# Patient Record
Sex: Female | Born: 1950 | Race: White | Hispanic: No | Marital: Married | Smoking: Never smoker
Health system: Southern US, Community
[De-identification: ages and names within clinical notes are randomized; demographics above are authoritative.]

## PROBLEM LIST (undated history)

## (undated) DIAGNOSIS — R7303 Prediabetes: Secondary | ICD-10-CM

## (undated) DIAGNOSIS — H269 Unspecified cataract: Secondary | ICD-10-CM

## (undated) DIAGNOSIS — F329 Major depressive disorder, single episode, unspecified: Secondary | ICD-10-CM

## (undated) DIAGNOSIS — C4491 Basal cell carcinoma of skin, unspecified: Secondary | ICD-10-CM

## (undated) DIAGNOSIS — N890 Mild vaginal dysplasia: Secondary | ICD-10-CM

## (undated) DIAGNOSIS — Z9889 Other specified postprocedural states: Secondary | ICD-10-CM

## (undated) DIAGNOSIS — T7840XA Allergy, unspecified, initial encounter: Secondary | ICD-10-CM

## (undated) DIAGNOSIS — E785 Hyperlipidemia, unspecified: Secondary | ICD-10-CM

## (undated) DIAGNOSIS — K759 Inflammatory liver disease, unspecified: Secondary | ICD-10-CM

## (undated) DIAGNOSIS — N200 Calculus of kidney: Secondary | ICD-10-CM

## (undated) DIAGNOSIS — Z87442 Personal history of urinary calculi: Secondary | ICD-10-CM

## (undated) DIAGNOSIS — I81 Portal vein thrombosis: Secondary | ICD-10-CM

## (undated) DIAGNOSIS — R112 Nausea with vomiting, unspecified: Secondary | ICD-10-CM

## (undated) DIAGNOSIS — I10 Essential (primary) hypertension: Secondary | ICD-10-CM

## (undated) DIAGNOSIS — A630 Anogenital (venereal) warts: Secondary | ICD-10-CM

## (undated) DIAGNOSIS — B029 Zoster without complications: Secondary | ICD-10-CM

## (undated) DIAGNOSIS — N189 Chronic kidney disease, unspecified: Secondary | ICD-10-CM

## (undated) DIAGNOSIS — E119 Type 2 diabetes mellitus without complications: Secondary | ICD-10-CM

## (undated) DIAGNOSIS — D049 Carcinoma in situ of skin, unspecified: Secondary | ICD-10-CM

## (undated) DIAGNOSIS — F32A Depression, unspecified: Secondary | ICD-10-CM

## (undated) DIAGNOSIS — M1990 Unspecified osteoarthritis, unspecified site: Secondary | ICD-10-CM

## (undated) DIAGNOSIS — T8859XA Other complications of anesthesia, initial encounter: Secondary | ICD-10-CM

## (undated) DIAGNOSIS — F419 Anxiety disorder, unspecified: Secondary | ICD-10-CM

## (undated) DIAGNOSIS — B259 Cytomegaloviral disease, unspecified: Secondary | ICD-10-CM

## (undated) HISTORY — DX: Basal cell carcinoma of skin, unspecified: C44.91

## (undated) HISTORY — DX: Depression, unspecified: F32.A

## (undated) HISTORY — DX: Anxiety disorder, unspecified: F41.9

## (undated) HISTORY — DX: Mild vaginal dysplasia: N89.0

## (undated) HISTORY — DX: Unspecified cataract: H26.9

## (undated) HISTORY — DX: Major depressive disorder, single episode, unspecified: F32.9

## (undated) HISTORY — DX: Essential (primary) hypertension: I10

## (undated) HISTORY — DX: Allergy, unspecified, initial encounter: T78.40XA

## (undated) HISTORY — DX: Unspecified osteoarthritis, unspecified site: M19.90

## (undated) HISTORY — DX: Calculus of kidney: N20.0

## (undated) HISTORY — DX: Chronic kidney disease, unspecified: N18.9

## (undated) HISTORY — DX: Zoster without complications: B02.9

## (undated) HISTORY — PX: HERNIA REPAIR: SHX51

## (undated) HISTORY — DX: Carcinoma in situ of skin, unspecified: D04.9

## (undated) HISTORY — DX: Type 2 diabetes mellitus without complications: E11.9

## (undated) HISTORY — DX: Portal vein thrombosis: I81

## (undated) HISTORY — PX: BLADDER SURGERY: SHX569

## (undated) HISTORY — DX: Hyperlipidemia, unspecified: E78.5

## (undated) HISTORY — DX: Anogenital (venereal) warts: A63.0

## (undated) HISTORY — PX: TUBAL LIGATION: SHX77

## (undated) HISTORY — PX: SKIN CANCER EXCISION: SHX779

---

## 1984-07-29 HISTORY — PX: WISDOM TOOTH EXTRACTION: SHX21

## 1984-07-29 HISTORY — PX: ABDOMINAL HYSTERECTOMY: SHX81

## 2004-05-10 ENCOUNTER — Ambulatory Visit: Payer: Self-pay | Admitting: Family Medicine

## 2007-01-16 ENCOUNTER — Ambulatory Visit: Payer: Self-pay | Admitting: Podiatry

## 2009-01-16 DIAGNOSIS — M719 Bursopathy, unspecified: Secondary | ICD-10-CM | POA: Insufficient documentation

## 2009-09-20 DIAGNOSIS — E1159 Type 2 diabetes mellitus with other circulatory complications: Secondary | ICD-10-CM | POA: Insufficient documentation

## 2009-09-20 DIAGNOSIS — F339 Major depressive disorder, recurrent, unspecified: Secondary | ICD-10-CM | POA: Insufficient documentation

## 2009-09-29 DIAGNOSIS — E559 Vitamin D deficiency, unspecified: Secondary | ICD-10-CM | POA: Insufficient documentation

## 2012-08-20 ENCOUNTER — Ambulatory Visit: Payer: Self-pay | Admitting: Podiatry

## 2012-08-20 HISTORY — PX: FOOT SURGERY: SHX648

## 2012-08-28 ENCOUNTER — Ambulatory Visit: Payer: Self-pay | Admitting: Family Medicine

## 2012-10-12 DIAGNOSIS — N23 Unspecified renal colic: Secondary | ICD-10-CM | POA: Insufficient documentation

## 2012-10-12 DIAGNOSIS — N201 Calculus of ureter: Secondary | ICD-10-CM | POA: Insufficient documentation

## 2012-10-12 DIAGNOSIS — N3941 Urge incontinence: Secondary | ICD-10-CM | POA: Insufficient documentation

## 2013-07-29 DIAGNOSIS — I81 Portal vein thrombosis: Secondary | ICD-10-CM

## 2013-07-29 HISTORY — DX: Portal vein thrombosis: I81

## 2014-10-28 ENCOUNTER — Ambulatory Visit: Admit: 2014-10-28 | Disposition: A | Discharge status: Home / Self Care | Payer: Self-pay | Admitting: Family Medicine

## 2014-10-28 LAB — CBC AND DIFFERENTIAL
HCT: 39 % (ref 36–46)
Hemoglobin: 12.9 g/dL (ref 12.0–16.0)
Platelets: 350 10*3/uL (ref 150–399)
WBC: 10.3 10^3/mL

## 2014-10-28 LAB — TSH: TSH: 2.83 u[IU]/mL (ref <=5.90)

## 2014-10-29 ENCOUNTER — Inpatient Hospital Stay
Admit: 2014-10-29 | Disposition: A | Discharge status: Home / Self Care | Payer: Self-pay | Attending: Internal Medicine | Admitting: Internal Medicine

## 2014-10-29 ENCOUNTER — Ambulatory Visit
Admit: 2014-10-29 | Disposition: A | Discharge status: Home / Self Care | Payer: Self-pay | Attending: Family Medicine | Admitting: Family Medicine

## 2014-10-29 LAB — MAGNESIUM: Magnesium: 2 mg/dL

## 2014-10-29 LAB — CBC WITH DIFFERENTIAL/PLATELET
BASOS PCT: 1 %
Basophil #: 0.1 10*3/uL (ref 0.0–0.1)
EOS PCT: 1.8 %
Eosinophil #: 0.2 10*3/uL (ref 0.0–0.7)
HCT: 42.4 % (ref 35.0–47.0)
HGB: 13.8 g/dL (ref 12.0–16.0)
Lymphocyte #: 2.3 10*3/uL (ref 1.0–3.6)
Lymphocyte %: 24 %
MCH: 28 pg (ref 26.0–34.0)
MCHC: 32.6 g/dL (ref 32.0–36.0)
MCV: 86 fL (ref 80–100)
Monocyte #: 0.7 x10 3/mm (ref 0.2–0.9)
Monocyte %: 7.5 %
NEUTROS ABS: 6.2 10*3/uL (ref 1.4–6.5)
NEUTROS PCT: 65.7 %
Platelet: 349 10*3/uL (ref 150–440)
RBC: 4.93 10*6/uL (ref 3.80–5.20)
RDW: 14.6 % — ABNORMAL HIGH (ref 11.5–14.5)
WBC: 9.5 10*3/uL (ref 3.6–11.0)

## 2014-10-29 LAB — COMPREHENSIVE METABOLIC PANEL
Albumin: 4.3 g/dL
Alkaline Phosphatase: 97 U/L
Anion Gap: 11 (ref 7–16)
BILIRUBIN TOTAL: 0.9 mg/dL
BUN: 12 mg/dL
Calcium, Total: 9.4 mg/dL
Chloride: 101 mmol/L
Co2: 25 mmol/L
Creatinine: 1.18 mg/dL — ABNORMAL HIGH
EGFR (African American): 57 — ABNORMAL LOW
EGFR (Non-African Amer.): 49 — ABNORMAL LOW
Glucose: 95 mg/dL
POTASSIUM: 3.8 mmol/L
SGOT(AST): 58 U/L — ABNORMAL HIGH
SGPT (ALT): 75 U/L — ABNORMAL HIGH
SODIUM: 137 mmol/L
TOTAL PROTEIN: 9.1 g/dL — AB

## 2014-10-29 LAB — LACTIC ACID, PLASMA: LACTIC ACID, VENOUS: 1.3 mmol/L

## 2014-10-29 LAB — TROPONIN I

## 2014-10-29 LAB — LIPASE, BLOOD: Lipase: 67 U/L — ABNORMAL HIGH

## 2014-10-29 LAB — PROTIME-INR
INR: 1
Prothrombin Time: 13.1 secs

## 2014-10-29 LAB — APTT: ACTIVATED PTT: 27.4 s (ref 23.6–35.9)

## 2014-10-30 LAB — BASIC METABOLIC PANEL
ANION GAP: 6 — AB (ref 7–16)
BUN: 10 mg/dL
Calcium, Total: 8.4 mg/dL — ABNORMAL LOW
Chloride: 109 mmol/L
Co2: 23 mmol/L
Creatinine: 1.16 mg/dL — ABNORMAL HIGH
GFR CALC AF AMER: 58 — AB
GFR CALC NON AF AMER: 50 — AB
Glucose: 104 mg/dL — ABNORMAL HIGH
Potassium: 3.9 mmol/L
SODIUM: 138 mmol/L

## 2014-10-30 LAB — CBC WITH DIFFERENTIAL/PLATELET
BASOS ABS: 0.1 10*3/uL (ref 0.0–0.1)
Basophil %: 0.8 %
EOS ABS: 0.2 10*3/uL (ref 0.0–0.7)
EOS PCT: 2.9 %
HCT: 35.1 % (ref 35.0–47.0)
HGB: 11.8 g/dL — ABNORMAL LOW (ref 12.0–16.0)
Lymphocyte #: 2.5 10*3/uL (ref 1.0–3.6)
Lymphocyte %: 31.9 %
MCH: 28.6 pg (ref 26.0–34.0)
MCHC: 33.5 g/dL (ref 32.0–36.0)
MCV: 85 fL (ref 80–100)
MONO ABS: 0.7 x10 3/mm (ref 0.2–0.9)
Monocyte %: 9 %
Neutrophil #: 4.4 10*3/uL (ref 1.4–6.5)
Neutrophil %: 55.4 %
PLATELETS: 291 10*3/uL (ref 150–440)
RBC: 4.11 10*6/uL (ref 3.80–5.20)
RDW: 14.2 % (ref 11.5–14.5)
WBC: 8 10*3/uL (ref 3.6–11.0)

## 2014-10-30 LAB — HEPARIN LEVEL (UNFRACTIONATED)
Anti-Xa(Unfractionated): 0.46 IU/mL (ref 0.30–0.70)
Anti-Xa(Unfractionated): 0.48 IU/mL (ref 0.30–0.70)

## 2014-10-31 LAB — CBC WITH DIFFERENTIAL/PLATELET
BASOS ABS: 0.1 10*3/uL (ref 0.0–0.1)
BASOS PCT: 0.7 %
Eosinophil #: 0.2 10*3/uL (ref 0.0–0.7)
Eosinophil %: 3.4 %
HCT: 37 % (ref 35.0–47.0)
HGB: 12 g/dL (ref 12.0–16.0)
LYMPHS PCT: 32.2 %
Lymphocyte #: 2.3 10*3/uL (ref 1.0–3.6)
MCH: 27.6 pg (ref 26.0–34.0)
MCHC: 32.3 g/dL (ref 32.0–36.0)
MCV: 86 fL (ref 80–100)
MONOS PCT: 8 %
Monocyte #: 0.6 x10 3/mm (ref 0.2–0.9)
Neutrophil #: 4 10*3/uL (ref 1.4–6.5)
Neutrophil %: 55.7 %
Platelet: 346 10*3/uL (ref 150–440)
RBC: 4.33 10*6/uL (ref 3.80–5.20)
RDW: 14.4 % (ref 11.5–14.5)
WBC: 7.1 10*3/uL (ref 3.6–11.0)

## 2014-10-31 LAB — HEPARIN LEVEL (UNFRACTIONATED): ANTI-XA(UNFRACTIONATED): 0.43 [IU]/mL (ref 0.30–0.70)

## 2014-10-31 LAB — CLOSTRIDIUM DIFFICILE(ARMC)

## 2014-11-03 LAB — CULTURE, BLOOD (SINGLE)

## 2014-11-03 LAB — STOOL CULTURE

## 2014-11-14 ENCOUNTER — Ambulatory Visit
Admit: 2014-11-14 | Disposition: A | Discharge status: Home / Self Care | Payer: Self-pay | Attending: Oncology | Admitting: Oncology

## 2014-11-27 NOTE — Consult Note (Signed)
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  Doubt bowel ischemia presentis anticoagulationfollow  Electronic Signatures: Algernon Huxley (MD)  (Signed on 03-Apr-16 10:23)  Authored  Last Updated: 03-Apr-16 10:23 by Algernon Huxley (MD)

## 2014-11-27 NOTE — Discharge Summary (Signed)
PATIENT NAME:  [name] [name], [name] [name] MR#:  [id number] DATE OF BIRTH:  [birth year]  DATE OF ADMISSION:  10/29/2014 DATE OF DISCHARGE:  10/31/2014  DISCHARGE DIAGNOSIS: Acute portal vein thrombosis.   SECONDARY DIAGNOSES:   Diabetes mellitus type 2 and depression. She says she has a history of Lyme disease, CMV, a long time back.    CONSULTATIONS: Vascular surgery, Algernon Huxley, MD.  PROCEDURES AND RADIOLOGY: 1.  Chest x-ray on April 1 showed no acute cardiopulmonary disease.  2.  CT scan of the abdomen and pelvis with contrast on April 2 showed venous thrombosis involving the right portal vein, distal portal vein and superior mesenteric vein.   MAJOR LABORATORY PANEL: 1.  Blood cultures x2 were negative on April 2.  2.  Stool for C. difficile was negative.   HISTORY AND SHORT HOSPITAL COURSE: The patient is a 64 year old female with the above-mentioned medical problems who was admitted for abdominal pain and fever. She was diagnosed with acute portal vein thrombosis. No obvious infectious etiology was found for her fever. Could be viral in nature. Vascular surgery consultation was obtained with Dr. Leotis Pain who recommended treatment with anticoagulation. The patient was continued on IV heparin while in the hospital and subsequently was changed to newer oral anticoagulant. I discussed the case with Dr. Delight Hoh from hematology who recommended Eliquis for this patient. There is no clear indication at this point for Eliquis for treatment for portal vein thrombosis, although Dr. Grayland Ormond recommended this and outpatient followup with hematology at [facility] here at [facility]. The patient was not was agreement for the same. Also discussed the case with the patient's primary care physician, Dr. Margarita Rana who was in agreement with the same. The patient is being discharged home. She does not have any further symptoms. She does have some diarrhea, although stool for Clostridium difficile was  negative and her diarrhea seemed to be improving. This certainly could be from IV contrast. The patient is being discharged home, as she is very adamant wanting to go home and does not have much nausea, vomiting and remains afebrile on the date of discharge. Her vital signs are as follows: Temperature 98.4, heart rate 63 per minute, respirations 17 per minute, blood pressure 131/80. She is saturating 98% on room air.  PERTINENT PHYSICAL EXAMINATION ON THE DATE OF DISCHARGE:  CARDIOVASCULAR: S1, S2 normal. No murmurs, rubs, or gallop.  LUNGS: Clear to auscultation bilaterally. No wheezing, rales, rhonchi, or crepitation.  ABDOMEN: Soft, benign.  NEUROLOGIC: Nonfocal examination. All other physical examination remained at baseline.  DISCHARGE MEDICATIONS:   Medication Instructions  metformin 500 mg oral tablet  1 tab(s) orally 2 times a day   sertraline 100 mg oral tablet  1 tab(s) orally once a day   zantac 150 150 mg oral tablet  1 tab(s) orally once a day as needed for indigestion, heartburn.   eliquis 5 mg oral tablet  2 tab(s) orally 2 times a day for first 5 days followed by 1 tab bid      DISCHARGE DIET: Low sodium, low fat, low cholesterol.   DISCHARGE ACTIVITY: As tolerated.  DISCHARGE INSTRUCTIONS AND FOLLOW-UP: The patient was instructed to follow up with her primary care physician, Dr. Margarita Rana, in 2 to 4 weeks. She will need follow-up with Dr. Delight Hoh in 1 to 2 weeks based.  TOTAL TIME SPENT WITH THIS PATIENT: 45 minutes. ____________________________ Lucina Mellow. Manuella Ghazi, MD vss:ST D: 10/31/2014 10:43:09 ET T: 10/31/2014 11:04:17 ET JOB#:  [id number]  cc: Jordell Outten S. Manuella Ghazi, MD, <Dictator> Jerrell Belfast, MD Kathlene November. Grayland Ormond, MD Algernon Huxley, MD   Lucina Mellow [facility] MD ELECTRONICALLY SIGNED 11/02/2014 11:12

## 2014-11-27 NOTE — H&P (Signed)
PATIENT NAME:  [name], [name] MR#:  [id number] DATE OF BIRTH:  12/02/1950  DATE OF ADMISSION:  10/29/2014  REASON FOR ADMISSION: Abdominal pain and fever.   HISTORY OF PRESENT ILLNESS: A 64 year old female with depression, diabetes brought in because of abdominal CAT scan of the abdomen. The patient had an episode of vomiting, diarrhea and GI illness on March 18 for about 3 to 4 days. The patient recovered from this illness, however, she is continued to have fever since this. Fever is up to 100 to 101 Fahrenheit every day, mostly after she eats lunch. Denies any weight loss. No further nausea. No vomiting. The patient denies any recent rash. No travel history. No mouth ulcers. No headache. No sore throat and no joint pains. The patient went to Dr. Margarita [name] regarding her fevers. She ordered abdominal CAT scan and it showed portal vein thrombosis.   PAST MEDICAL HISTORY: Significant for diabetes mellitus type 2 and depression. She says she has a history of Lyme disease, CMV, a long time back.   PAST SURGICAL HISTORY: Total abdominal hysterectomy.  ALLERGIC WHEAT, FLAGYL AND STATINS.   Denies any celiac disease.  SOCIAL HISTORY: No smoking, no drinking, no drugs.   MEDICATION: Metformin 500 mg p.o. daily. The patient is supposed to take it twice daily, but she says he takes it only once a day, Zoloft 100 mg p.o. daily.   FAMILY HISTORY: Diabetes in the father and also son.   REVIEW OF SYSTEMS: CONSTITUTIONAL: Has fever for about 2 weeks. No weakness. No weight loss.  EYES: No blurred vision.  ENT: No tinnitus. No ear pain. No epistaxis. No difficulty swallowing.  RESPIRATORY: No cough. No wheezing.  CARDIOVASCULAR: No chest pain, no orthopnea, no PND.  GASTROINTESTINAL: Has some nausea occasionally and mild mid epigastric abdominal pain on and off.  GENITOURINARY: No dysuria or hematuria.  ENDOCRINE: No polyuria or polydipsia.  INTEGUMENTARY: No skin rashes.  MUSCULOSKELETAL: No  joint pains.  NEUROLOGIC: No numbness, weakness, CVA or TIA. PSYCHIATRIC: No anxiety and insomnia.   PHYSICAL EXAMINATION: VITAL SIGNS: Temperature 97.8, heart rate 64, blood pressure 119/74, saturations 97% on room.  GENERAL: She is a well developed, well nourished female, answering questions appropriately. Not in distress.  HEENT: Atraumatic, normocephalic. Pupils are equal, round and reactive to light and accommodation.  Extraocular movements are intact. No scleral icterus. No conjunctivitis. Hearing is intact. No pharyngeal erythema. Mucous membranes are dry.  NECK: No thyroid enlargement. No JVD. No carotid bruits.  LUNGS: Clear to auscultation. No wheeze. No rales.  CARDIOVASCULAR: S1, S2 regular. No tachycardia. PMI not displaced. No thrills. Good femoral pulses, pedal pulse. No extremity edema.  ABDOMEN: Slight mid epigastric tenderness present. No rebound tenderness. Bowel sounds present. No organomegaly. No hernias.  MUSCULOSKELETAL: Normal strength 5/5 in upper and lower extremities.  SKIN: No skin rashes. No erythema, no nodules.  NEUROLOGIC: Cranial nerves II through XII intact. Deep tendon reflexes 2+ bilaterally. Sensory intact. No dysarthria or aphasia.  PSYCHIATRIC: Oriented to time, place, person.   LABORATORY DATA: Lipase is 67, INR is 1, magnesium is 2, WBC 9.5, hemoglobin 13.8, hematocrit 42.4, platelets 349,000. Electrolytes: Sodium is 137, potassium 3.8, chloride 101, bicarbonate 25, BUN 12, creatinine 1.18, glucose 95. Troponin less than 0.03.   DIAGNOSTIC DATA: Abdominal CAT scan shows venous thrombosis of the right portal vein, distal main portal vein, superior mesenteric vein. No obvious etiology for this is identified in the abdomen or pelvis. May be associated with hypercoagulable state,  dehydration, pancreatitis, bowel pathology, although there is no bowel wall thickening or surrounding stranding to suggest enteritis. The patient's EKG shows sinus bradycardia at 58  beats. No ST-T changes.   ASSESSMENT AND PLAN: 1. The patient is a 64 year old female with portal vein thrombosis, likely secondary to severe dehydration 2 weeks ago and also mild acute pancreatitis. Admit to medical service. Start on a heparin drip, IV fluids and obtain hypercoagulable work-up including protein C, protein S, factor V Leiden deficiency and antithrombin III. Vascular consult with Dr. Lucky Cowboy is already in place to evaluate for portal vein thrombosis and possible angiogram and further intervention. The patient will be on IV fluids. Follow the blood cultures. The patient has some vascular stranding concerning for phlebitis. We are giving her IV fluids and IV antibiotics.  2. Diabetes mellitus, type 2. She is on metformin at home, but creatinine is slightly up with her dehydration, so hold metformin and do sliding scale with coverage.  3. Depression. Continue her Zoloft 100 mg p.o. daily.   TIME SPENT: 55 minutes.    ____________________________ Epifanio Lesches, MD sk:TT D: 10/29/2014 17:27:32 ET T: 10/29/2014 18:12:20 ET JOB#: [id number]  cc: Epifanio Lesches, MD, <Dictator> Epifanio Lesches MD ELECTRONICALLY SIGNED 10/30/2014 15:54

## 2014-12-15 LAB — HEMOGLOBIN A1C: Hgb A1c MFr Bld: 6.4 % — AB (ref 4.0–6.0)

## 2014-12-15 LAB — LIPID PANEL
CHOLESTEROL: 262 mg/dL — AB (ref 0–200)
HDL: 51 mg/dL (ref 35–70)
LDL Cholesterol: 176 mg/dL
Triglycerides: 174 mg/dL — AB (ref 40–160)

## 2014-12-15 LAB — HEPATIC FUNCTION PANEL
ALT: 43 U/L — AB (ref 7–35)
AST: 32 U/L (ref 13–35)

## 2014-12-15 LAB — BASIC METABOLIC PANEL
BUN: 15 mg/dL (ref 4–21)
CREATININE: 1.1 mg/dL (ref 0.5–1.1)
Glucose: 114 mg/dL
POTASSIUM: 4.6 mmol/L (ref 3.4–5.3)
Sodium: 141 mmol/L (ref 137–147)

## 2015-02-07 ENCOUNTER — Other Ambulatory Visit: Payer: Self-pay | Admitting: Family Medicine

## 2015-02-07 DIAGNOSIS — F329 Major depressive disorder, single episode, unspecified: Secondary | ICD-10-CM

## 2015-02-07 DIAGNOSIS — F32A Depression, unspecified: Secondary | ICD-10-CM

## 2015-03-02 ENCOUNTER — Other Ambulatory Visit: Payer: Self-pay | Admitting: Family Medicine

## 2015-03-02 NOTE — Telephone Encounter (Signed)
Next ov is on 04/05/2015

## 2015-04-04 DIAGNOSIS — F419 Anxiety disorder, unspecified: Secondary | ICD-10-CM | POA: Insufficient documentation

## 2015-04-04 DIAGNOSIS — B356 Tinea cruris: Secondary | ICD-10-CM | POA: Insufficient documentation

## 2015-04-04 DIAGNOSIS — L309 Dermatitis, unspecified: Secondary | ICD-10-CM | POA: Insufficient documentation

## 2015-04-04 DIAGNOSIS — E785 Hyperlipidemia, unspecified: Secondary | ICD-10-CM | POA: Insufficient documentation

## 2015-04-04 DIAGNOSIS — I81 Portal vein thrombosis: Secondary | ICD-10-CM | POA: Insufficient documentation

## 2015-04-04 DIAGNOSIS — E1122 Type 2 diabetes mellitus with diabetic chronic kidney disease: Secondary | ICD-10-CM | POA: Insufficient documentation

## 2015-04-04 DIAGNOSIS — E1169 Type 2 diabetes mellitus with other specified complication: Secondary | ICD-10-CM | POA: Insufficient documentation

## 2015-04-04 DIAGNOSIS — L98499 Non-pressure chronic ulcer of skin of other sites with unspecified severity: Secondary | ICD-10-CM | POA: Insufficient documentation

## 2015-04-04 DIAGNOSIS — A6920 Lyme disease, unspecified: Secondary | ICD-10-CM

## 2015-04-04 DIAGNOSIS — N200 Calculus of kidney: Secondary | ICD-10-CM | POA: Insufficient documentation

## 2015-04-04 DIAGNOSIS — R5383 Other fatigue: Secondary | ICD-10-CM | POA: Insufficient documentation

## 2015-04-04 DIAGNOSIS — S8990XA Unspecified injury of unspecified lower leg, initial encounter: Secondary | ICD-10-CM | POA: Insufficient documentation

## 2015-04-04 DIAGNOSIS — N289 Disorder of kidney and ureter, unspecified: Secondary | ICD-10-CM | POA: Insufficient documentation

## 2015-04-04 DIAGNOSIS — R109 Unspecified abdominal pain: Secondary | ICD-10-CM | POA: Insufficient documentation

## 2015-04-04 DIAGNOSIS — K759 Inflammatory liver disease, unspecified: Secondary | ICD-10-CM | POA: Insufficient documentation

## 2015-04-04 DIAGNOSIS — R748 Abnormal levels of other serum enzymes: Secondary | ICD-10-CM | POA: Insufficient documentation

## 2015-04-04 DIAGNOSIS — E7800 Pure hypercholesterolemia, unspecified: Secondary | ICD-10-CM | POA: Insufficient documentation

## 2015-04-04 DIAGNOSIS — J309 Allergic rhinitis, unspecified: Secondary | ICD-10-CM | POA: Insufficient documentation

## 2015-04-04 DIAGNOSIS — E119 Type 2 diabetes mellitus without complications: Secondary | ICD-10-CM | POA: Insufficient documentation

## 2015-04-04 HISTORY — DX: Lyme disease, unspecified: A69.20

## 2015-04-05 ENCOUNTER — Ambulatory Visit (INDEPENDENT_AMBULATORY_CARE_PROVIDER_SITE_OTHER): Payer: BC Managed Care – PPO | Admitting: Family Medicine

## 2015-04-05 ENCOUNTER — Encounter: Payer: Self-pay | Admitting: Family Medicine

## 2015-04-05 VITALS — BP 120/70 | HR 70 | Temp 98.5°F | Resp 16 | Wt 177.4 lb

## 2015-04-05 DIAGNOSIS — E7800 Pure hypercholesterolemia, unspecified: Secondary | ICD-10-CM

## 2015-04-05 DIAGNOSIS — I81 Portal vein thrombosis: Secondary | ICD-10-CM

## 2015-04-05 DIAGNOSIS — E119 Type 2 diabetes mellitus without complications: Secondary | ICD-10-CM | POA: Diagnosis not present

## 2015-04-05 NOTE — Progress Notes (Signed)
Patient: [name] [name] Female    DOB: 10-12-1950   64 y.o.   MRN: [id number] Visit Date: 04/05/2015  Today's Provider: Margarita Rana, MD   Chief Complaint  Patient presents with  . Follow-up   Subjective:    HPI [name] [name] is here following on Hypercholesteremia, Diabetes Mellitus Type 2 and Deep Vein Thrombosis of portal vein per patient feels fine. Feels that the medicine is working,current medication is Apixaban.   Taking medication for portal vein thrombosis. No side effects to medication. Fevers gone. No active bleeding.  Needs to stay on medication for 6 months.  That would be next month. Blood work negative for other etiology.  Needs to stay well hydrated. Was sick when first had problem.    Does not need follow up with vascular.    Last Hgb A1C was 6.4 on 12/19/14. Doing ok. Taking medications. Has  Some lows. Knows how to recognize and treat it. If being super good, goes too low.  Will look for pattern to lows. Does get down to 60.  Will try to prevent that.  Has not seen eye doctor.    ALso need cholesterol rechecked.  Is unable to tolerate statins.   Allergies  Allergen Reactions  . Atorvastatin     no further information given.  . Lovastatin     muscle aches  . Metronidazole     no further information given.  . Statins Other (See Comments)  . Wheat Bran   . Zetia  [Ezetimibe]     muscle aches   Previous Medications   APIXABAN (ELIQUIS) 5 MG TABS TABLET    Take 1 tablet by mouth 2 (two) times daily.   CHOLECALCIFEROL (VITAMIN D) 1000 UNITS TABLET    Take 1 tablet by mouth daily.   METFORMIN (GLUCOPHAGE) 500 MG TABLET    Take 1 tablet by mouth 2 (two) times daily.   ONE TOUCH ULTRA TEST TEST STRIP    TEST DAILY   SERTRALINE (ZOLOFT) 100 MG TABLET    1 TABLET, ORAL, 1EVERY DAY AT BEDTIME    Review of Systems  Constitutional: Negative.   HENT: Negative.   Eyes: Negative.   Respiratory: Negative.   Cardiovascular: Negative.   Gastrointestinal:  Negative.   Endocrine: Negative.   Genitourinary: Negative.   Musculoskeletal: Negative.   Skin: Negative.   Allergic/Immunologic: Negative.   Neurological: Negative.   Hematological: Negative.   Psychiatric/Behavioral: Negative.     Social History  Substance Use Topics  . Smoking status: Never Smoker   . Smokeless tobacco: Never Used  . Alcohol Use: No   Objective:   BP 120/70 mmHg  Pulse 70  Temp(Src) 98.5 F (36.9 C) (Oral)  Resp 16  Wt 177 lb 6.4 oz (80.468 kg)  Physical Exam  Constitutional: She is oriented to person, place, and time. She appears well-developed and well-nourished.  Neurological: She is alert and oriented to person, place, and time.  Psychiatric: She has a normal mood and affect. Her behavior is normal. Judgment and thought content normal.        Assessment & Plan:     1. Deep vein thrombosis of portal vein Will check labs.  Will stop medication next month. Work up negative.  Presumed related to illness and dehydration. Start Baby ASA when off anticoagulation.    - CBC with Differential/Platelet  2. Hypercholesteremia Recheck labs.  - Lipid panel - Comprehensive metabolic panel  3. Type 2 diabetes mellitus  without complication Stable. Warned about how to avoid lows. Stressed importance of this. Continue current medication.  Call if any problems.  Will check labs today and again in 3 months.  - Hemoglobin A1C      Margarita Rana, MD  [place]

## 2015-04-12 ENCOUNTER — Telehealth: Payer: Self-pay

## 2015-04-12 LAB — HEMOGLOBIN A1C
Est. average glucose Bld gHb Est-mCnc: 151 mg/dL
Hgb A1c MFr Bld: 6.9 % — ABNORMAL HIGH (ref 4.8–5.6)

## 2015-04-12 NOTE — Telephone Encounter (Signed)
Pt advised.   Thanks,   -Myna Freimark  

## 2015-04-12 NOTE — Telephone Encounter (Signed)
-----   Message from Margarita Rana, MD sent at 04/12/2015  9:10 AM EDT ----- HgbA1c was 6.9.  Please notify patient. Thanks.

## 2015-05-12 LAB — HM DIABETES EYE EXAM

## 2015-07-06 ENCOUNTER — Encounter: Payer: Self-pay | Admitting: Family Medicine

## 2015-07-06 ENCOUNTER — Ambulatory Visit (INDEPENDENT_AMBULATORY_CARE_PROVIDER_SITE_OTHER): Payer: BC Managed Care – PPO | Admitting: Family Medicine

## 2015-07-06 VITALS — BP 122/84 | HR 64 | Temp 98.3°F | Resp 16 | Wt 178.0 lb

## 2015-07-06 DIAGNOSIS — I10 Essential (primary) hypertension: Secondary | ICD-10-CM | POA: Diagnosis not present

## 2015-07-06 DIAGNOSIS — M1990 Unspecified osteoarthritis, unspecified site: Secondary | ICD-10-CM | POA: Diagnosis not present

## 2015-07-06 DIAGNOSIS — M7122 Synovial cyst of popliteal space [Baker], left knee: Secondary | ICD-10-CM

## 2015-07-06 DIAGNOSIS — E119 Type 2 diabetes mellitus without complications: Secondary | ICD-10-CM

## 2015-07-06 DIAGNOSIS — N3090 Cystitis, unspecified without hematuria: Secondary | ICD-10-CM | POA: Diagnosis not present

## 2015-07-06 DIAGNOSIS — R748 Abnormal levels of other serum enzymes: Secondary | ICD-10-CM

## 2015-07-06 DIAGNOSIS — M7120 Synovial cyst of popliteal space [Baker], unspecified knee: Secondary | ICD-10-CM | POA: Insufficient documentation

## 2015-07-06 LAB — POCT UA - MICROALBUMIN: Microalbumin Ur, POC: 20 mg/L

## 2015-07-06 LAB — POCT URINALYSIS DIPSTICK
BILIRUBIN UA: NEGATIVE
Blood, UA: NEGATIVE
GLUCOSE UA: NEGATIVE
KETONES UA: NEGATIVE
NITRITE UA: NEGATIVE
Protein, UA: NEGATIVE
Spec Grav, UA: >=1.03
Urobilinogen, UA: 0.2
pH, UA: 6

## 2015-07-06 MED ORDER — ACETAMINOPHEN 500 MG PO TABS: 1000.0000 mg | ORAL_TABLET | Freq: Three times a day (TID) | ORAL | Status: DC | PRN

## 2015-07-06 MED ORDER — ASPIRIN EC 81 MG PO TBEC: 81.0000 mg | DELAYED_RELEASE_TABLET | Freq: Every day | ORAL | Status: DC

## 2015-07-06 NOTE — Progress Notes (Signed)
Subjective:    Patient ID: [name] [name], female    DOB: 12-14-1950, 64 y.o.   MRN: [id number]  Diabetes She presents for her follow-up (Last A1C was 04/12/2015 and was 6.9% (6 days too soon for A1C recheck)) diabetic visit. She has type 2 (Sugars are 90 to 132. ) diabetes mellitus. Hypoglycemia symptoms include dizziness ("start walking sideways" happens about once monthly. Recognizes and treats it approopriately. ). Pertinent negatives for diabetes include no blurred vision, no chest pain, no fatigue, no foot paresthesias, no foot ulcerations, no polydipsia, no polyphagia, no polyuria, no visual change, no weakness and no weight loss. Symptoms are stable. Risk factors for coronary artery disease include family history, dyslipidemia and post-menopausal. She is compliant with treatment all of the time. Her weight is stable. She is following a generally healthy diet. There is no change in her home blood glucose trend. (Checks BS only she feels like she's having a low BS) An ACE inhibitor/angiotensin II receptor blocker is not being taken. Eye exam is current.   DVT Pt was last seen on 04/05/2015 for this problem. Work up was negative, and pt was told she D/C Eliquis and start ASA 81 mg po qd. Pt reports she is not taking ASA because she is taking 2 Aleve in the mornings for arthralgias, and is asking advice whether she can take ASA with naproxen or not.  Wants to take something else.  Worse in the am.  Takes her awhile to get moving. Left knee aches.   Think band in her knee. Also has it in her thumbs.   Has not tried tylenol at high dose.     UTI Pt is requesting a UA because she is unsure if she is having a kidney stone vs a UTI. Is having symptoms. Just wants to make sure it is not an infection.  Knows they just do what they want to.    Review of Systems  Constitutional: Negative for weight loss and fatigue.  Eyes: Negative for blurred vision.  Cardiovascular: Negative for chest pain.    Endocrine: Negative for polydipsia, polyphagia and polyuria.  Musculoskeletal: Positive for joint swelling, arthralgias and neck stiffness.  Neurological: Positive for dizziness ("start walking sideways" happens about once monthly. Recognizes and treats it approopriately. ). Negative for weakness.   BP 122/84 mmHg  Pulse 64  Temp(Src) 98.3 F (36.8 C) (Oral)  Resp 16  Wt 178 lb (80.74 kg)   Patient Active Problem List   Diagnosis Date Noted  . Abdominal pain 04/04/2015  . Abnormal kidney function 04/04/2015  . Anxiety 04/04/2015  . Dermatitis, eczematoid 04/04/2015  . Diabetes ([place]) 04/04/2015  . Diabetes mellitus, type 2 ([place]) 04/04/2015  . Abnormal liver enzymes 04/04/2015  . Hepatitis 04/04/2015  . Hypercholesteremia 04/04/2015  . Calculus of kidney 04/04/2015  . Injury of leg 04/04/2015  . 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 Allergic rhinitis 04/04/2015  . Fatigue 04/04/2015  . 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 Dermatophytosis of groin 04/04/2015  . Ischemic ulcer ([place]) 04/04/2015  . Depression 02/07/2015  . Urge incontinence 10/12/2012  . Renal colic 123456  . Avitaminosis D 09/29/2009  . Clinical depression 09/20/2009  . Essential (primary) hypertension 09/20/2009  . Carpal tunnel syndrome 01/17/2009  . Bursitis 01/16/2009   Past Medical History  Diagnosis Date  . Allergy   . Anxiety   . Depression   . Diabetes mellitus without complication ([place])   . Hypertension   . Hyperlipidemia   .  Chronic kidney disease    Current Outpatient Prescriptions on File Prior to Visit  Medication Sig  . cholecalciferol (VITAMIN D) 1000 UNITS tablet Take 1 tablet by mouth daily.  . metFORMIN (GLUCOPHAGE) 500 MG tablet Take 1 tablet by mouth 2 (two) times daily.  . ONE TOUCH ULTRA TEST test strip TEST DAILY  . sertraline (ZOLOFT) 100 MG tablet 1 TABLET, ORAL, 1EVERY DAY AT BEDTIME   No current facility-administered medications on file prior to visit.    Allergies  Allergen Reactions  . Atorvastatin     no further information given.  . Lovastatin     muscle aches  . Metronidazole     no further information given.  . Statins Other (See Comments)  . Wheat Bran   . Zetia  [Ezetimibe]     muscle aches   Past Surgical History  Procedure Laterality Date  . Foot surgery Left 08/20/2012    Dr. Elvina Mattes  . Abdominal hysterectomy  1986  . Wisdom tooth extraction  1986   Social History   Social History  . Marital Status: Married    Spouse Name: N/A  . Number of Children: N/A  . Years of Education: N/A   Occupational History  . Not on file.   Social History Main Topics  . Smoking status: Never Smoker   . Smokeless tobacco: Never Used  . Alcohol Use: No  . Drug Use: No  . Sexual Activity: Not on file   Other Topics Concern  . Not on file   Social History Narrative   Family History  Problem Relation Age of Onset  . Cancer Mother     breast  . Parkinsonism Mother   . Alcohol abuse Father       Objective:   Physical Exam  Constitutional: She is oriented to person, place, and time. She appears well-developed and well-nourished.  Cardiovascular: Normal rate and regular rhythm.   Pulmonary/Chest: Effort normal and breath sounds normal.  Musculoskeletal: She exhibits no edema.  Swollen behind her left leg.    Neurological: She is alert and oriented to person, place, and time.  Psychiatric: She has a normal mood and affect. Her behavior is normal. Judgment and thought content normal.   BP 122/84 mmHg  Pulse 64  Temp(Src) 98.3 F (36.8 C) (Oral)  Resp 16  Wt 178 lb (80.74 kg)      Assessment & Plan:  1. Cystitis Will send for culture.  - POCT urinalysis dipstick - Urine culture Results for orders placed or performed in visit on 07/06/15  POCT urinalysis dipstick  Result Value Ref Range   Color, UA yellow    Clarity, UA clear    Glucose, UA Negative    Bilirubin, UA Negative    Ketones, UA Negative    Spec  Grav, UA >=1.030    Blood, UA Negative    pH, UA 6.0    Protein, UA Negative    Urobilinogen, UA 0.2    Nitrite, UA Negative    Leukocytes, UA Trace (A) Negative  POCT UA - Microalbumin  Result Value Ref Range   Microalbumin Ur, POC 20 mg/L    2. Type 2 diabetes mellitus without complication, without long-term current use of insulin ([place]) Too early to check. Thinks it has been running ok.   - POCT UA - Microalbumin  3. Essential (primary) hypertension Stable off medication.   4. Abnormal liver enzymes Will recheck.   5. Arthritis Tylenol  bid, Aleve just prn and  restart baby asa.  Will check labs.   6. Baker's cyst of knee, left Will refer if worsens.   Margarita Rana, MD

## 2015-07-08 LAB — COMPREHENSIVE METABOLIC PANEL
ALK PHOS: 94 IU/L (ref 39–117)
ALT: 19 IU/L (ref 0–32)
AST: 21 IU/L (ref 0–40)
Albumin/Globulin Ratio: 2.1 (ref 1.1–2.5)
Albumin: 4.6 g/dL (ref 3.6–4.8)
BILIRUBIN TOTAL: 0.3 mg/dL (ref 0.0–1.2)
BUN/Creatinine Ratio: 15 (ref 11–26)
BUN: 17 mg/dL (ref 8–27)
CHLORIDE: 105 mmol/L (ref 97–106)
CO2: 24 mmol/L (ref 18–29)
Calcium: 9.4 mg/dL (ref 8.7–10.3)
Creatinine, Ser: 1.17 mg/dL — ABNORMAL HIGH (ref 0.57–1.00)
GFR calc Af Amer: 57 mL/min/{1.73_m2} — ABNORMAL LOW (ref >59)
GFR calc non Af Amer: 50 mL/min/{1.73_m2} — ABNORMAL LOW (ref >59)
Globulin, Total: 2.2 g/dL (ref 1.5–4.5)
Glucose: 98 mg/dL (ref 65–99)
Potassium: 4.1 mmol/L (ref 3.5–5.2)
Sodium: 145 mmol/L — ABNORMAL HIGH (ref 136–144)
Total Protein: 6.8 g/dL (ref 6.0–8.5)

## 2015-07-08 LAB — SEDIMENTATION RATE: Sed Rate: 2 mm/hr (ref 0–40)

## 2015-07-08 LAB — CYCLIC CITRUL PEPTIDE ANTIBODY, IGG/IGA: Cyclic Citrullin Peptide Ab: 93 units — ABNORMAL HIGH (ref 0–19)

## 2015-07-08 LAB — URINE CULTURE

## 2015-07-08 LAB — RHEUMATOID FACTOR

## 2015-07-12 ENCOUNTER — Telehealth: Payer: Self-pay | Admitting: Family Medicine

## 2015-07-12 DIAGNOSIS — M1990 Unspecified osteoarthritis, unspecified site: Secondary | ICD-10-CM

## 2015-07-12 NOTE — Telephone Encounter (Signed)
Pt states she is supposed to be referred to a rheumatologist.There is no referral in [facility]

## 2015-07-13 NOTE — Telephone Encounter (Signed)
Ok to order. Thanks.   

## 2015-07-13 NOTE — Telephone Encounter (Signed)
Order placed for rheumatology.

## 2015-07-13 NOTE — Telephone Encounter (Signed)
Okay to order?  Thanks,   -Laura  

## 2015-08-02 DIAGNOSIS — N183 Chronic kidney disease, stage 3 unspecified: Secondary | ICD-10-CM | POA: Insufficient documentation

## 2016-01-03 ENCOUNTER — Other Ambulatory Visit: Payer: Self-pay

## 2016-01-03 DIAGNOSIS — E119 Type 2 diabetes mellitus without complications: Secondary | ICD-10-CM

## 2016-01-03 MED ORDER — METFORMIN HCL 500 MG PO TABS
500.0000 mg | ORAL_TABLET | Freq: Two times a day (BID) | ORAL | Status: DC
Start: 2016-01-03 — End: 2016-06-27

## 2016-04-11 ENCOUNTER — Other Ambulatory Visit: Payer: Self-pay | Admitting: Family Medicine

## 2016-04-28 ENCOUNTER — Other Ambulatory Visit: Payer: Self-pay | Admitting: Family Medicine

## 2016-04-28 DIAGNOSIS — F32A Depression, unspecified: Secondary | ICD-10-CM

## 2016-04-28 DIAGNOSIS — F329 Major depressive disorder, single episode, unspecified: Secondary | ICD-10-CM

## 2016-06-27 ENCOUNTER — Other Ambulatory Visit: Payer: Self-pay | Admitting: Family Medicine

## 2016-06-27 DIAGNOSIS — E119 Type 2 diabetes mellitus without complications: Secondary | ICD-10-CM

## 2016-09-30 ENCOUNTER — Encounter: Payer: Self-pay | Admitting: Physician Assistant

## 2016-09-30 ENCOUNTER — Ambulatory Visit (INDEPENDENT_AMBULATORY_CARE_PROVIDER_SITE_OTHER): Payer: Medicare Other | Admitting: Physician Assistant

## 2016-09-30 VITALS — BP 130/82 | HR 100 | Temp 98.0°F | Resp 16 | Wt 183.2 lb

## 2016-09-30 DIAGNOSIS — F3342 Major depressive disorder, recurrent, in full remission: Secondary | ICD-10-CM

## 2016-09-30 DIAGNOSIS — E119 Type 2 diabetes mellitus without complications: Secondary | ICD-10-CM

## 2016-09-30 DIAGNOSIS — I10 Essential (primary) hypertension: Secondary | ICD-10-CM | POA: Diagnosis not present

## 2016-09-30 DIAGNOSIS — E7800 Pure hypercholesterolemia, unspecified: Secondary | ICD-10-CM

## 2016-09-30 DIAGNOSIS — F419 Anxiety disorder, unspecified: Secondary | ICD-10-CM | POA: Diagnosis not present

## 2016-09-30 MED ORDER — SERTRALINE HCL 100 MG PO TABS: 100.0000 mg | ORAL_TABLET | Freq: Every day | ORAL | 1 refills | Status: DC

## 2016-09-30 NOTE — Patient Instructions (Signed)

## 2016-09-30 NOTE — Progress Notes (Signed)
Patient: [name] [name] Female    DOB: 1951/07/21   66 y.o.   MRN: [id number] Visit Date: 09/30/2016  Today's Provider: Mar Daring, PA-C   Chief Complaint  Patient presents with  . Medication Refill  . Follow-up    Hypertension,diabetes, Hypercholesteremia   Subjective:    HPI  Diabetes Mellitus Type II, Follow-up:   Lab Results  Component Value Date   HGBA1C 6.9 (H) 04/11/2015   HGBA1C 6.4 (A) 12/15/2014   Last seen for diabetes 15 months ago.  Management since then includes none. She reports excellent compliance with treatment. She is not having side effects.  Current symptoms include none and have been stable. Home blood sugar records: 90's-140's  Episodes of hypoglycemia? no   Most Recent Eye Exam:Nov.2016 Weight trend: stable Current diet: in general, a "healthy" diet   Current exercise: walking  ------------------------------------------------------------------------   Hypertension, follow-up:  BP Readings from Last 3 Encounters:  09/30/16 130/82  07/06/15 122/84  04/05/15 120/70    She was last seen for hypertension 15 months ago.  BP at that visit was 122/84. Management since that visit includes none. She is off meds. She is not exercising. She is adherent to low salt diet.   Outside blood pressures are n/a. She is experiencing none.  Patient denies chest pain, chest pressure/discomfort, exertional chest pressure/discomfort, fatigue, irregular heart beat, lower extremity edema and palpitations.   Cardiovascular risk factors include advanced age (older than 73 for men, 67 for women), diabetes mellitus, dyslipidemia and hypertension.   ------------------------------------------------------------------------    Lipid/Cholesterol, Follow-up:   Last seen for this 17 months ago.  Management since that visit includes none.  Last Lipid Panel:    Component Value Date/Time   CHOL 262 (A) 12/15/2014   TRIG 174 (A) 12/15/2014   HDL 51  12/15/2014   LDLCALC 176 12/15/2014   ------------------------------------------------------------------------ Depression screen PHQ 2/9 09/30/2016  Decreased Interest 0  Down, Depressed, Hopeless 0  PHQ - 2 Score 0  Altered sleeping 0  Tired, decreased energy 0  Change in appetite 0  Feeling bad or failure about yourself  0  Trouble concentrating 0  Moving slowly or fidgety/restless 0  Suicidal thoughts 0  PHQ-9 Score 0  Difficult doing work/chores Not difficult at all      Allergies  Allergen Reactions  . Atorvastatin     no further information given.  . Lovastatin     muscle aches  . Metronidazole     no further information given.  . Statins Other (See Comments)  . Wheat Bran   . Zetia  [Ezetimibe]     muscle aches     Current Outpatient Prescriptions:  .  acetaminophen (TYLENOL) 500 MG tablet, Take 2 tablets (1,000 mg total) by mouth every 8 (eight) hours as needed., Disp: 1 tablet, Rfl: 0 .  aspirin EC 81 MG tablet, Take 1 tablet (81 mg total) by mouth daily., Disp: 1 tablet, Rfl: 0 .  cholecalciferol (VITAMIN D) 1000 UNITS tablet, Take 1 tablet by mouth daily., Disp: , Rfl:  .  glucose blood (ONE TOUCH ULTRA TEST) test strip, Check blood sugar once a day., Disp: 50 each, Rfl: 6 .  metFORMIN (GLUCOPHAGE) 500 MG tablet, TAKE 1 TABLET (500 MG TOTAL) BY MOUTH 2 (TWO) TIMES DAILY., Disp: 180 tablet, Rfl: 1 .  sertraline (ZOLOFT) 100 MG tablet, TAKE ONE TABLET BY MOUTH EVERY DAY AT BEDTIME, Disp: 90 tablet, Rfl: 1 .  naproxen sodium (  ANAPROX) 220 MG tablet, Take 440 mg by mouth daily., Disp: , Rfl:  .  Omega-3 Fatty Acids (FISH OIL) 1200 MG CAPS, Take by mouth., Disp: , Rfl:   Review of Systems  Constitutional: Negative for fatigue.  Respiratory: Negative for cough, chest tightness and shortness of breath.   Cardiovascular: Negative for chest pain, palpitations and leg swelling.  Endocrine: Negative for cold intolerance, heat intolerance, polydipsia, polyphagia and  polyuria.  Musculoskeletal: Positive for arthralgias.  Neurological: Negative.   Psychiatric/Behavioral: Negative.     Social History  Substance Use Topics  . Smoking status: Never Smoker  . Smokeless tobacco: Never Used  . Alcohol use No   Objective:   BP 130/82 (BP Location: Right Arm, Patient Position: Sitting, Cuff Size: Normal)   Pulse 100   Temp 98 F (36.7 C) (Oral)   Resp 16   Wt 183 lb 3.2 oz (83.1 kg)   BMI 32.45 kg/m    Physical Exam  Constitutional: She appears well-developed and well-nourished. No distress.  Neck: Normal range of motion. Neck supple. No JVD present. No tracheal deviation present. No thyromegaly present.  Cardiovascular: Normal rate, regular rhythm and normal heart sounds.  Exam reveals no gallop and no friction rub.   No murmur heard. Pulmonary/Chest: Effort normal and breath sounds normal. No respiratory distress. She has no wheezes. She has no rales.  Musculoskeletal: She exhibits no edema.  Lymphadenopathy:    She has no cervical adenopathy.  Skin: She is not diaphoretic.  Psychiatric: She has a normal mood and affect. Her behavior is normal. Judgment and thought content normal.  Vitals reviewed.     Assessment & Plan:     1. Type 2 diabetes mellitus without complication, without long-term current use of insulin ([place]) Patient reports she is taking metformin 500mg  BID. Refuses labs today. Will call back to schedule initial AWV in June 2018.  2. Essential (primary) hypertension Stable.   3. Hypercholesteremia Patient refuses labs secondary to financial reasons and timing. She reports she will call back to schedule initial AWV in June.  4. Anxiety Stable on sertraline.   5. Recurrent major depressive disorder, in full remission ([place]) Stable. Diagnosis pulled for medication refill. Continue current medical treatment plan. - sertraline (ZOLOFT) 100 MG tablet; Take 1 tablet (100 mg total) by mouth at bedtime.  Dispense: 90 tablet;  Refill: Thomas, PA-C  [facility]

## 2016-10-21 ENCOUNTER — Other Ambulatory Visit: Payer: Self-pay | Admitting: Physician Assistant

## 2016-10-21 DIAGNOSIS — F32A Depression, unspecified: Secondary | ICD-10-CM

## 2016-10-21 DIAGNOSIS — F329 Major depressive disorder, single episode, unspecified: Secondary | ICD-10-CM

## 2016-12-20 ENCOUNTER — Other Ambulatory Visit: Payer: Self-pay | Admitting: Physician Assistant

## 2016-12-20 DIAGNOSIS — E119 Type 2 diabetes mellitus without complications: Secondary | ICD-10-CM

## 2016-12-20 NOTE — Telephone Encounter (Signed)
Please review [name] [name]

## 2017-01-14 ENCOUNTER — Telehealth: Payer: Self-pay | Admitting: Physician Assistant

## 2017-01-22 ENCOUNTER — Ambulatory Visit (INDEPENDENT_AMBULATORY_CARE_PROVIDER_SITE_OTHER): Payer: Medicare Other | Admitting: Physician Assistant

## 2017-01-22 ENCOUNTER — Encounter: Payer: Self-pay | Admitting: Physician Assistant

## 2017-01-22 VITALS — BP 120/80 | HR 84 | Temp 98.1°F | Resp 16 | Ht 63.0 in | Wt 182.4 lb

## 2017-01-22 DIAGNOSIS — N289 Disorder of kidney and ureter, unspecified: Secondary | ICD-10-CM | POA: Diagnosis not present

## 2017-01-22 DIAGNOSIS — Z1211 Encounter for screening for malignant neoplasm of colon: Secondary | ICD-10-CM | POA: Diagnosis not present

## 2017-01-22 DIAGNOSIS — Z1231 Encounter for screening mammogram for malignant neoplasm of breast: Secondary | ICD-10-CM | POA: Diagnosis not present

## 2017-01-22 DIAGNOSIS — I10 Essential (primary) hypertension: Secondary | ICD-10-CM

## 2017-01-22 DIAGNOSIS — Z0000 Encounter for general adult medical examination without abnormal findings: Secondary | ICD-10-CM

## 2017-01-22 DIAGNOSIS — Z1239 Encounter for other screening for malignant neoplasm of breast: Secondary | ICD-10-CM

## 2017-01-22 DIAGNOSIS — R748 Abnormal levels of other serum enzymes: Secondary | ICD-10-CM | POA: Diagnosis not present

## 2017-01-22 DIAGNOSIS — E7800 Pure hypercholesterolemia, unspecified: Secondary | ICD-10-CM | POA: Diagnosis not present

## 2017-01-22 DIAGNOSIS — Z13820 Encounter for screening for osteoporosis: Secondary | ICD-10-CM

## 2017-01-22 DIAGNOSIS — Z780 Asymptomatic menopausal state: Secondary | ICD-10-CM

## 2017-01-22 DIAGNOSIS — E119 Type 2 diabetes mellitus without complications: Secondary | ICD-10-CM | POA: Diagnosis not present

## 2017-01-22 DIAGNOSIS — Z6832 Body mass index (BMI) 32.0-32.9, adult: Secondary | ICD-10-CM | POA: Diagnosis not present

## 2017-01-22 DIAGNOSIS — Z2821 Immunization not carried out because of patient refusal: Secondary | ICD-10-CM | POA: Diagnosis not present

## 2017-01-22 NOTE — Patient Instructions (Signed)
Health Maintenance for Postmenopausal Women Menopause is a normal process in which your reproductive ability comes to an end. This process happens gradually over a span of months to years, usually between the ages of 22 and 9. Menopause is complete when you have missed 12 consecutive menstrual periods. It is important to talk with your health care provider about some of the most common conditions that affect postmenopausal women, such as heart disease, cancer, and bone loss (osteoporosis). Adopting a healthy lifestyle and getting preventive care can help to promote your health and wellness. Those actions can also lower your chances of developing some of these common conditions. What should I know about menopause? During menopause, you may experience a number of symptoms, such as:  Moderate-to-severe hot flashes.  Night sweats.  Decrease in sex drive.  Mood swings.  Headaches.  Tiredness.  Irritability.  Memory problems.  Insomnia.  Choosing to treat or not to treat menopausal changes is an individual decision that you make with your health care provider. What should I know about hormone replacement therapy and supplements? Hormone therapy products are effective for treating symptoms that are associated with menopause, such as hot flashes and night sweats. Hormone replacement carries certain risks, especially as you become older. If you are thinking about using estrogen or estrogen with progestin treatments, discuss the benefits and risks with your health care provider. What should I know about heart disease and stroke? Heart disease, heart attack, and stroke become more likely as you age. This may be due, in part, to the hormonal changes that your body experiences during menopause. These can affect how your body processes dietary fats, triglycerides, and cholesterol. Heart attack and stroke are both medical emergencies. There are many things that you can do to help prevent heart disease  and stroke:  Have your blood pressure checked at least every 1-2 years. High blood pressure causes heart disease and increases the risk of stroke.  If you are 53-22 years old, ask your health care provider if you should take aspirin to prevent a heart attack or a stroke.  Do not use any tobacco products, including cigarettes, chewing tobacco, or electronic cigarettes. If you need help quitting, ask your health care provider.  It is important to eat a healthy diet and maintain a healthy weight. ? Be sure to include plenty of vegetables, fruits, low-fat dairy products, and lean protein. ? Avoid eating foods that are high in solid fats, added sugars, or salt (sodium).  Get regular exercise. This is one of the most important things that you can do for your health. ? Try to exercise for at least 150 minutes each week. The type of exercise that you do should increase your heart rate and make you sweat. This is known as moderate-intensity exercise. ? Try to do strengthening exercises at least twice each week. Do these in addition to the moderate-intensity exercise.  Know your numbers.Ask your health care provider to check your cholesterol and your blood glucose. Continue to have your blood tested as directed by your health care provider.  What should I know about cancer screening? There are several types of cancer. Take the following steps to reduce your risk and to catch any cancer development as early as possible. Breast Cancer  Practice breast self-awareness. ? This means understanding how your breasts normally appear and feel. ? It also means doing regular breast self-exams. Let your health care provider know about any changes, no matter how small.  If you are 40  or older, have a clinician do a breast exam (clinical breast exam or CBE) every year. Depending on your age, family history, and medical history, it may be recommended that you also have a yearly breast X-ray (mammogram).  If you  have a family history of breast cancer, talk with your health care provider about genetic screening.  If you are at high risk for breast cancer, talk with your health care provider about having an MRI and a mammogram every year.  Breast cancer (BRCA) gene test is recommended for women who have family members with BRCA-related cancers. Results of the assessment will determine the need for genetic counseling and BRCA1 and for BRCA2 testing. BRCA-related cancers include these types: ? Breast. This occurs in males or females. ? Ovarian. ? Tubal. This may also be called fallopian tube cancer. ? Cancer of the abdominal or pelvic lining (peritoneal cancer). ? Prostate. ? Pancreatic.  Cervical, Uterine, and Ovarian Cancer Your health care provider may recommend that you be screened regularly for cancer of the pelvic organs. These include your ovaries, uterus, and vagina. This screening involves a pelvic exam, which includes checking for microscopic changes to the surface of your cervix (Pap test).  For women ages 21-65, health care providers may recommend a pelvic exam and a Pap test every three years. For women ages 79-65, they may recommend the Pap test and pelvic exam, combined with testing for human papilloma virus (HPV), every five years. Some types of HPV increase your risk of cervical cancer. Testing for HPV may also be done on women of any age who have unclear Pap test results.  Other health care providers may not recommend any screening for nonpregnant women who are considered low risk for pelvic cancer and have no symptoms. Ask your health care provider if a screening pelvic exam is right for you.  If you have had past treatment for cervical cancer or a condition that could lead to cancer, you need Pap tests and screening for cancer for at least 20 years after your treatment. If Pap tests have been discontinued for you, your risk factors (such as having a new sexual partner) need to be  reassessed to determine if you should start having screenings again. Some women have medical problems that increase the chance of getting cervical cancer. In these cases, your health care provider may recommend that you have screening and Pap tests more often.  If you have a family history of uterine cancer or ovarian cancer, talk with your health care provider about genetic screening.  If you have vaginal bleeding after reaching menopause, tell your health care provider.  There are currently no reliable tests available to screen for ovarian cancer.  Lung Cancer Lung cancer screening is recommended for adults 69-62 years old who are at high risk for lung cancer because of a history of smoking. A yearly low-dose CT scan of the lungs is recommended if you:  Currently smoke.  Have a history of at least 30 pack-years of smoking and you currently smoke or have quit within the past 15 years. A pack-year is smoking an average of one pack of cigarettes per day for one year.  Yearly screening should:  Continue until it has been 15 years since you quit.  Stop if you develop a health problem that would prevent you from having lung cancer treatment.  Colorectal Cancer  This type of cancer can be detected and can often be prevented.  Routine colorectal cancer screening usually begins at  age 42 and continues through age 45.  If you have risk factors for colon cancer, your health care provider may recommend that you be screened at an earlier age.  If you have a family history of colorectal cancer, talk with your health care provider about genetic screening.  Your health care provider may also recommend using home test kits to check for hidden blood in your stool.  A small camera at the end of a tube can be used to examine your colon directly (sigmoidoscopy or colonoscopy). This is done to check for the earliest forms of colorectal cancer.  Direct examination of the colon should be repeated every  5-10 years until age 71. However, if early forms of precancerous polyps or small growths are found or if you have a family history or genetic risk for colorectal cancer, you may need to be screened more often.  Skin Cancer  Check your skin from head to toe regularly.  Monitor any moles. Be sure to tell your health care provider: ? About any new moles or changes in moles, especially if there is a change in a mole's shape or color. ? If you have a mole that is larger than the size of a pencil eraser.  If any of your family members has a history of skin cancer, especially at a young age, talk with your health care provider about genetic screening.  Always use sunscreen. Apply sunscreen liberally and repeatedly throughout the day.  Whenever you are outside, protect yourself by wearing long sleeves, pants, a wide-brimmed hat, and sunglasses.  What should I know about osteoporosis? Osteoporosis is a condition in which bone destruction happens more quickly than new bone creation. After menopause, you may be at an increased risk for osteoporosis. To help prevent osteoporosis or the bone fractures that can happen because of osteoporosis, the following is recommended:  If you are 46-71 years old, get at least 1,000 mg of calcium and at least 600 mg of vitamin D per day.  If you are older than age 55 but younger than age 65, get at least 1,200 mg of calcium and at least 600 mg of vitamin D per day.  If you are older than age 54, get at least 1,200 mg of calcium and at least 800 mg of vitamin D per day.  Smoking and excessive alcohol intake increase the risk of osteoporosis. Eat foods that are rich in calcium and vitamin D, and do weight-bearing exercises several times each week as directed by your health care provider. What should I know about how menopause affects my mental health? Depression may occur at any age, but it is more common as you become older. Common symptoms of depression  include:  Low or sad mood.  Changes in sleep patterns.  Changes in appetite or eating patterns.  Feeling an overall lack of motivation or enjoyment of activities that you previously enjoyed.  Frequent crying spells.  Talk with your health care provider if you think that you are experiencing depression. What should I know about immunizations? It is important that you get and maintain your immunizations. These include:  Tetanus, diphtheria, and pertussis (Tdap) booster vaccine.  Influenza every year before the flu season begins.  Pneumonia vaccine.  Shingles vaccine.  Your health care provider may also recommend other immunizations. This information is not intended to replace advice given to you by your health care provider. Make sure you discuss any questions you have with your health care provider. Document Released: 09/06/2005  Document Revised: 02/02/2016 Document Reviewed: 04/18/2015 [employer] Interactive Patient Education  2018 [employer]  

## 2017-01-22 NOTE — Progress Notes (Signed)
Patient: [name] [name], Female    DOB: 01-12-1951, 66 y.o.   MRN: [id number] Visit Date: 01/22/2017  Today's Provider: Mar Daring, PA-C   Chief Complaint  Patient presents with  . Medicare Wellness   Subjective:    Annual wellness visit [name] [name] is a 66 y.o. female. She feels well. She reports exercising daily. She reports she is sleeping well. -----------------------------------------------------------   Review of Systems  Constitutional: Negative.   HENT: Negative.   Eyes: Negative.   Respiratory: Negative.   Cardiovascular: Negative.   Gastrointestinal: Negative.   Endocrine: Negative.   Genitourinary: Negative.   Musculoskeletal: Negative.   Skin: Negative.   Allergic/Immunologic: Negative.   Hematological: Negative.   Psychiatric/Behavioral: Negative.     Social History   Social History  . Marital status: Married    Spouse name: N/A  . Number of children: N/A  . Years of education: N/A   Occupational History  . Not on file.   Social History Main Topics  . Smoking status: Never Smoker  . Smokeless tobacco: Never Used  . Alcohol use No  . Drug use: No  . Sexual activity: Not on file   Other Topics Concern  . Not on file   Social History Narrative  . No narrative on file    Past Medical History:  Diagnosis Date  . Allergy   . Anxiety   . Chronic kidney disease   . Depression   . Diabetes mellitus without complication ([place])   . Hyperlipidemia   . Hypertension      Patient Active Problem List   Diagnosis Date Noted  . Arthritis 07/06/2015  . Baker's cyst of knee 07/06/2015  . Abnormal kidney function 04/04/2015  . Anxiety 04/04/2015  . Dermatitis, eczematoid 04/04/2015  . Diabetes mellitus, type 2 ([place]) 04/04/2015  . Abnormal liver enzymes 04/04/2015  . Hepatitis 04/04/2015  . Hypercholesteremia 04/04/2015  . Calculus of kidney 04/04/2015  . 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 Allergic rhinitis 04/04/2015  .  Fatigue 04/04/2015  . 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 Dermatophytosis of groin 04/04/2015  . Urge incontinence 10/12/2012  . Renal colic 75/91/6384  . Avitaminosis D 09/29/2009  . Clinical depression 09/20/2009  . Essential (primary) hypertension 09/20/2009  . Carpal tunnel syndrome 01/17/2009  . Bursitis 01/16/2009    Past Surgical History:  Procedure Laterality Date  . ABDOMINAL HYSTERECTOMY  1986  . FOOT SURGERY Left 08/20/2012   Dr. Elvina Mattes  . [place]    Her family history includes Alcohol abuse in her father; Cancer in her mother; Parkinsonism in her mother.      Current Outpatient Prescriptions:  .  acetaminophen (TYLENOL) 500 MG tablet, Take 2 tablets (1,000 mg total) by mouth every 8 (eight) hours as needed., Disp: 1 tablet, Rfl: 0 .  aspirin EC 81 MG tablet, Take 1 tablet (81 mg total) by mouth daily., Disp: 1 tablet, Rfl: 0 .  cholecalciferol (VITAMIN D) 1000 UNITS tablet, Take 1 tablet by mouth daily., Disp: , Rfl:  .  glucose blood (ONE TOUCH ULTRA TEST) test strip, Check blood sugar once a day., Disp: 50 each, Rfl: 6 .  sertraline (ZOLOFT) 100 MG tablet, TAKE ONE TABLET BY MOUTH EVERY DAY AT BEDTIME, Disp: 90 tablet, Rfl: 1  Patient Care Team: Mar Daring, PA-C as PCP - General (Family Medicine)     Objective:   Vitals: BP 120/80 (BP Location: Left  Arm, Patient Position: Sitting, Cuff Size: Large)   Pulse 84   Temp 98.1 F (36.7 C) (Oral)   Resp 16   Ht 5\' 3"  (1.6 m)   Wt 182 lb 6.4 oz (82.7 kg)   SpO2 97%   BMI 32.31 kg/m   Physical Exam  Constitutional: She is oriented to person, place, and time. She appears well-developed and well-nourished. No distress.  HENT:  Head: Normocephalic and atraumatic.  Right Ear: Hearing, tympanic membrane, external ear and ear canal normal.  Left Ear: Hearing, tympanic membrane, external ear and ear canal normal.  Nose: Nose normal.  Mouth/Throat: Uvula is midline,  oropharynx is clear and moist and mucous membranes are normal. No oropharyngeal exudate.  Eyes: Conjunctivae and EOM are normal. Pupils are equal, round, and reactive to light. Right eye exhibits no discharge. Left eye exhibits no discharge. No scleral icterus.  Neck: Normal range of motion. Neck supple. No JVD present. Carotid bruit is not present. No tracheal deviation present. No thyromegaly present.  Cardiovascular: Normal rate, regular rhythm, normal heart sounds and intact distal pulses.  Exam reveals no gallop and no friction rub.   No murmur heard. Pulmonary/Chest: Effort normal and breath sounds normal. No respiratory distress. She has no wheezes. She has no rales. She exhibits no tenderness.  Patient deferred  Abdominal: Soft. Bowel sounds are normal. She exhibits no distension and no mass. There is no tenderness. There is no rebound and no guarding.  Genitourinary:  Genitourinary Comments: Patient deferred due to having hysterectomy  Musculoskeletal: Normal range of motion. She exhibits no edema or tenderness.  Lymphadenopathy:    She has no cervical adenopathy.  Neurological: She is alert and oriented to person, place, and time.  Skin: Skin is warm and dry. No rash noted. She is not diaphoretic.  Psychiatric: She has a normal mood and affect. Her behavior is normal. Judgment and thought content normal.  Vitals reviewed.   Activities of Daily Living In your present state of health, do you have any difficulty performing the following activities: 01/22/2017  Hearing? N  Vision? N  Difficulty concentrating or making decisions? N  Walking or climbing stairs? N  Dressing or bathing? N  Doing errands, shopping? N  Some recent data might be hidden    Fall Risk Assessment Fall Risk  01/22/2017  Falls in the past year? No     Depression Screen PHQ 2/9 Scores 01/22/2017 09/30/2016  PHQ - 2 Score 0 0  PHQ- 9 Score 0 0    Cognitive Testing - 6-CIT  Correct? Score   What year is  it? yes 0 0 or 4  What month is it? yes 0 0 or 3  Memorize:    [name] [name],  43,  [place],      What time is it? (within 1 hour) yes 0 0 or 3  Count backwards from 20 yes 0 0, 2, or 4  Name the months of the year yes 0 0, 2, or 4  Repeat name & address above yes 0 0, 2, 4, 6, 8, or 10       TOTAL SCORE  0/28   Interpretation:  Normal  Normal (0-7) Abnormal (8-28)       Assessment & Plan:     Annual Wellness Visit  Reviewed patient's Family Medical History Reviewed and updated list of patient's medical providers Assessment of cognitive impairment was done Assessed patient's functional ability Established a written schedule for health screening  services Health Risk Assessent Completed and Reviewed  Exercise Activities and Dietary recommendations Goals    None      Immunization History  Administered Date(s) Administered  . Td 12/14/2000  . Tdap 10/07/2012    Health Maintenance  Topic Date Due  . Hepatitis C Screening  1950-09-02  . FOOT EXAM  09/20/1961  . MAMMOGRAM  09/20/2001  . COLONOSCOPY  09/20/2001  . HEMOGLOBIN A1C  10/09/2015  . OPHTHALMOLOGY EXAM  05/11/2016  . URINE MICROALBUMIN  07/05/2016  . DEXA SCAN  09/20/2016  . PNA vac Low Risk Adult (1 of 2 - PCV13) 01/22/2018 (Originally 09/20/2016)  . INFLUENZA VACCINE  02/26/2017  . TETANUS/TDAP  10/08/2022  . HIV Screening  Completed  . PAP SMEAR  Excluded     Discussed health benefits of physical activity, and encouraged her to engage in regular exercise appropriate for her age and condition.    1. Initial Medicare annual wellness visit EKG today shows NSR rate of 83, no ST changes. Exam unremarkable. Patient refuses vaccines.  - EKG 12-Lead  2. Breast cancer screening Patient declined mammogram initially but has now decided she may get since she is agreeable to BMD. Advised to try to schedule together to adhere to compliance. Breast exam deferred today by patient. She performs self  breast exams. Information for [facility] given to patient.  - MM Digital Screening; Future  3. Colon cancer screening Patient refused.  4. Pneumococcal vaccination declined by patient Patient refused.   5. Essential (primary) hypertension Stable today. Dash diet encouraged. Will check labs as below and f/u pending results. - CBC w/Diff/Platelet - Comprehensive Metabolic Panel (CMET) - TSH  6. Type 2 diabetes mellitus without complication, without long-term current use of insulin (HCC) Patient self discontinued metformin stating it caused her arthralgias and myalgias. She has been trying to limit carbs and sugars in her diet. Will check labs as below and f/u pending results. - HgB A1c  7. Hypercholesteremia Diet controlled as well. Patient has been intolerant to previous statin therapies. Will check labs as below and f/u pending results. - Lipid Profile  8. Abnormal liver enzymes H/O this. Will check labs as below and f/u pending results. - Comprehensive Metabolic Panel (CMET)  9. Abnormal kidney function H/O this. Will check labs as below and f/u pending results. - Comprehensive Metabolic Panel (CMET)  10. BMI 32.0-32.9,adult Counseled patient on healthy lifestyle modifications including dieting and exercise.  Dash diet and low carb diet discussed.   11. Osteoporosis screening Patient agreeable to bone density. Never had before. This was ordered as below and I will f/u pending results.  - DG Bone Density; Future  12. Postmenopausal estrogen deficiency See above medical treatment plan. - DG Bone Density; Future  ------------------------------------------------------------------------------------------------------------    Mar Daring, PA-C  [facility]

## 2017-01-28 ENCOUNTER — Telehealth: Payer: Self-pay

## 2017-01-28 LAB — CBC WITH DIFFERENTIAL/PLATELET
BASOS: 0 %
Basophils Absolute: 0 10*3/uL (ref 0.0–0.2)
EOS (ABSOLUTE): 0.3 10*3/uL (ref 0.0–0.4)
Eos: 4 %
Hematocrit: 44.9 % (ref 34.0–46.6)
Hemoglobin: 14.7 g/dL (ref 11.1–15.9)
Immature Grans (Abs): 0 10*3/uL (ref 0.0–0.1)
Immature Granulocytes: 0 %
Lymphocytes Absolute: 2.3 10*3/uL (ref 0.7–3.1)
Lymphs: 39 %
MCH: 28.4 pg (ref 26.6–33.0)
MCHC: 32.7 g/dL (ref 31.5–35.7)
MCV: 87 fL (ref 79–97)
MONOS ABS: 0.4 10*3/uL (ref 0.1–0.9)
Monocytes: 6 %
NEUTROS ABS: 3 10*3/uL (ref 1.4–7.0)
Neutrophils: 51 %
PLATELETS: 203 10*3/uL (ref 150–379)
RBC: 5.18 x10E6/uL (ref 3.77–5.28)
RDW: 15.2 % (ref 12.3–15.4)
WBC: 6 10*3/uL (ref 3.4–10.8)

## 2017-01-28 LAB — COMPREHENSIVE METABOLIC PANEL
A/G RATIO: 1.7 (ref 1.2–2.2)
ALT: 20 IU/L (ref 0–32)
AST: 22 IU/L (ref 0–40)
Albumin: 4.6 g/dL (ref 3.6–4.8)
Alkaline Phosphatase: 94 IU/L (ref 39–117)
BUN/Creatinine Ratio: 12 (ref 12–28)
BUN: 16 mg/dL (ref 8–27)
Bilirubin Total: 0.5 mg/dL (ref 0.0–1.2)
CHLORIDE: 106 mmol/L (ref 96–106)
CO2: 20 mmol/L (ref 20–29)
Calcium: 9.4 mg/dL (ref 8.7–10.3)
Creatinine, Ser: 1.3 mg/dL — ABNORMAL HIGH (ref 0.57–1.00)
GFR calc non Af Amer: 43 mL/min/{1.73_m2} — ABNORMAL LOW (ref >59)
GFR, EST AFRICAN AMERICAN: 50 mL/min/{1.73_m2} — AB (ref >59)
Globulin, Total: 2.7 g/dL (ref 1.5–4.5)
Glucose: 129 mg/dL — ABNORMAL HIGH (ref 65–99)
Potassium: 4.4 mmol/L (ref 3.5–5.2)
SODIUM: 143 mmol/L (ref 134–144)
TOTAL PROTEIN: 7.3 g/dL (ref 6.0–8.5)

## 2017-01-28 LAB — LIPID PANEL
Chol/HDL Ratio: 6 ratio — ABNORMAL HIGH (ref 0.0–4.4)
Cholesterol, Total: 269 mg/dL — ABNORMAL HIGH (ref 100–199)
HDL: 45 mg/dL (ref >39)
LDL Calculated: 184 mg/dL — ABNORMAL HIGH (ref 0–99)
Triglycerides: 200 mg/dL — ABNORMAL HIGH (ref 0–149)
VLDL Cholesterol Cal: 40 mg/dL (ref 5–40)

## 2017-01-28 LAB — TSH: TSH: 3.78 u[IU]/mL (ref 0.450–4.500)

## 2017-01-28 LAB — HEMOGLOBIN A1C
Est. average glucose Bld gHb Est-mCnc: 151 mg/dL
HEMOGLOBIN A1C: 6.9 % — AB (ref 4.8–5.6)

## 2017-01-28 NOTE — Telephone Encounter (Signed)
-----   Message from Mar Daring, [place] sent at 01/28/2017 10:55 AM EDT ----- Cholesterol has increased compared to labs 2 years ago. However ASCVD 10 yr risk is borderline low at 6.27%. A1c also stable but elevated at 6.9, same as last year. Continue to work on healthy lifestyle modifications including limiting fatty foods, carbohydrates and sugars, and red meat. Increase physical activity as tolerated to try to get 150 minutes moderate physical activity per week. We will recheck labs in one year.

## 2017-01-28 NOTE — Telephone Encounter (Signed)
LMTCB  Thanks,  -Joseline 

## 2017-02-04 NOTE — Telephone Encounter (Signed)
LMTCB

## 2017-02-17 NOTE — Telephone Encounter (Signed)
Patient advised.

## 2017-03-21 ENCOUNTER — Other Ambulatory Visit: Payer: Self-pay | Admitting: Physician Assistant

## 2017-03-23 ENCOUNTER — Other Ambulatory Visit: Payer: Self-pay | Admitting: Family Medicine

## 2017-03-23 DIAGNOSIS — E119 Type 2 diabetes mellitus without complications: Secondary | ICD-10-CM

## 2017-03-24 ENCOUNTER — Other Ambulatory Visit: Payer: Self-pay

## 2017-04-28 NOTE — Telephone Encounter (Signed)
AWV scheduled 

## 2017-05-06 ENCOUNTER — Ambulatory Visit
Admission: RE | Admit: 2017-05-06 | Discharge: 2017-05-06 | Disposition: A | Discharge status: Home / Self Care | Payer: Medicare Other | Source: Ambulatory Visit | Attending: Physician Assistant | Admitting: Physician Assistant

## 2017-05-06 DIAGNOSIS — Z1231 Encounter for screening mammogram for malignant neoplasm of breast: Secondary | ICD-10-CM | POA: Diagnosis not present

## 2017-05-06 DIAGNOSIS — Z13820 Encounter for screening for osteoporosis: Secondary | ICD-10-CM | POA: Diagnosis not present

## 2017-05-06 DIAGNOSIS — M85832 Other specified disorders of bone density and structure, left forearm: Secondary | ICD-10-CM | POA: Diagnosis not present

## 2017-05-06 DIAGNOSIS — Z1239 Encounter for other screening for malignant neoplasm of breast: Secondary | ICD-10-CM

## 2017-05-06 DIAGNOSIS — Z780 Asymptomatic menopausal state: Secondary | ICD-10-CM

## 2017-06-01 ENCOUNTER — Other Ambulatory Visit: Payer: Self-pay | Admitting: Physician Assistant

## 2017-06-01 DIAGNOSIS — F32A Depression, unspecified: Secondary | ICD-10-CM

## 2017-06-01 DIAGNOSIS — F329 Major depressive disorder, single episode, unspecified: Secondary | ICD-10-CM

## 2017-06-03 ENCOUNTER — Ambulatory Visit: Payer: Medicare Other | Admitting: Physician Assistant

## 2017-06-03 ENCOUNTER — Encounter: Payer: Self-pay | Admitting: Physician Assistant

## 2017-06-03 VITALS — BP 142/88 | HR 90 | Temp 98.3°F | Resp 16 | Wt 180.2 lb

## 2017-06-03 DIAGNOSIS — N750 Cyst of Bartholin's gland: Secondary | ICD-10-CM | POA: Diagnosis not present

## 2017-06-03 DIAGNOSIS — N898 Other specified noninflammatory disorders of vagina: Secondary | ICD-10-CM

## 2017-06-03 DIAGNOSIS — N816 Rectocele: Secondary | ICD-10-CM

## 2017-06-03 NOTE — Progress Notes (Signed)
Patient: [name] [name] Female    DOB: July 15, 1951   66 y.o.   MRN: [id number] Visit Date: 06/03/2017  Today's Provider: Mar Daring, PA-C   Chief Complaint  Patient presents with  . Cyst    knot in private area   Subjective:    HPI  Patient is here today with c/o of two (2) knots in private area. She noticed the little knot within a week and the larger one has been there for the past 6 month. No discharge,redness. It doesn't hurt.    Allergies  Allergen Reactions  . Atorvastatin     no further information given.  . Lovastatin     muscle aches  . Metformin And Related Other (See Comments)    arthralgis  . Metronidazole     no further information given.  . Statins Other (See Comments)  . Wheat Bran   . Zetia  [Ezetimibe]     muscle aches     Current Outpatient Medications:  .  acetaminophen (TYLENOL) 500 MG tablet, Take 2 tablets (1,000 mg total) by mouth every 8 (eight) hours as needed., Disp: 1 tablet, Rfl: 0 .  aspirin EC 81 MG tablet, Take 1 tablet (81 mg total) by mouth daily., Disp: 1 tablet, Rfl: 0 .  cholecalciferol (VITAMIN D) 1000 UNITS tablet, Take 1 tablet by mouth daily., Disp: , Rfl:  .  ONE TOUCH ULTRA TEST test strip, CHECK BLOOD SUGAR ONCE DAILY, Disp: 50 each, Rfl: 6 .  sertraline (ZOLOFT) 100 MG tablet, TAKE ONE TABLET BY MOUTH EVERY DAY AT BEDTIME, Disp: 90 tablet, Rfl: 1 .  metFORMIN (GLUCOPHAGE) 500 MG tablet, TAKE 1 TABLET BY MOUTH TWICE A DAY (Patient not taking: Reported on 06/03/2017), Disp: 180 tablet, Rfl: 1  Review of Systems  Constitutional: Negative for fatigue and fever.  Respiratory: Negative.   Cardiovascular: Negative.   Gastrointestinal: Negative.   Genitourinary: Positive for genital sores. Negative for decreased urine volume, dyspareunia, dysuria, enuresis, flank pain, frequency, hematuria, pelvic pain, urgency, vaginal bleeding, vaginal discharge and vaginal pain.  Musculoskeletal: Negative for back pain.    Social  History   Tobacco Use  . Smoking status: Never Smoker  . Smokeless tobacco: Never Used  Substance Use Topics  . Alcohol use: No   Objective:   BP (!) 142/88 (BP Location: Left Arm, Patient Position: Sitting, Cuff Size: Normal)   Pulse 90   Temp 98.3 F (36.8 C) (Oral)   Resp 16   Wt 180 lb 3.2 oz (81.7 kg)   SpO2 98%   BMI 31.92 kg/m    Physical Exam  Constitutional: She appears well-developed and well-nourished. No distress.  Neck: Normal range of motion. Neck supple.  Cardiovascular: Normal rate, regular rhythm and normal heart sounds. Exam reveals no gallop and no friction rub.  No murmur heard. Pulmonary/Chest: Effort normal and breath sounds normal. No respiratory distress. She has no wheezes. She has no rales.  Genitourinary:    No labial fusion. There is no rash, tenderness, lesion or injury on the right labia. There is lesion on the left labia. There is no rash, tenderness or injury on the left labia. No erythema, tenderness or bleeding in the vagina. No foreign body in the vagina. No signs of injury around the vagina. No vaginal discharge found.  Lymphadenopathy:       Right: No inguinal adenopathy present.       Left: No inguinal adenopathy present.  Skin: She is  not diaphoretic.  Vitals reviewed.       Assessment & Plan:     1. 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 Does have lesion noted over the area of thickness in the labia majora on the left side. Will refer to GYN for further evaluation and treatment considerations.  - Ambulatory referral to Gynecology  2. Bartholin gland cyst Possibly cyst but concerned due to overlying skin lesion noted with patient denying pain or drainage.   3. Rectocele Suspected on vaginal exam. Did not perform speculum exam but noted small bulge from posterior vaginal opening noted to be prominent in about half of the vaginal opening but not protruding.        Mar Daring, PA-C    [facility]

## 2017-06-06 ENCOUNTER — Ambulatory Visit (INDEPENDENT_AMBULATORY_CARE_PROVIDER_SITE_OTHER): Payer: Medicare Other | Admitting: Obstetrics and Gynecology

## 2017-06-06 ENCOUNTER — Encounter: Payer: Self-pay | Admitting: Obstetrics and Gynecology

## 2017-06-06 ENCOUNTER — Other Ambulatory Visit: Payer: Self-pay | Admitting: Obstetrics and Gynecology

## 2017-06-06 VITALS — BP 146/88 | HR 95 | Ht 62.0 in | Wt 182.0 lb

## 2017-06-06 DIAGNOSIS — N909 Noninflammatory disorder of vulva and perineum, unspecified: Secondary | ICD-10-CM

## 2017-06-06 DIAGNOSIS — N9089 Other specified noninflammatory disorders of vulva and perineum: Secondary | ICD-10-CM

## 2017-06-06 NOTE — Addendum Note (Signed)
Addended by: Clemetine Marker D on: 06/06/2017 03:08 PM   Modules accepted: Orders

## 2017-06-06 NOTE — Progress Notes (Signed)
HPI:      [name] is a 66 y.o. G3P3, No LMP recorded. Patient has had a hysterectomy., presents today for a problem visit.  She complains of: two lesions on her perineum. One lesion has been present on her left labia for 6 months. The second lesion has been present at her vaginal introitus for 1 month. She denies vaginal bleeding, denies pain, denies abnormal discharge.  Vulvar concern:   This is a 66 y.o. old Caucasian/White female who presents for the evaluation of vulvar lesion(s). She describes the vulvar lesion(s) as flat, hard. She indicates that she has noticed 2 lesions that the average size is approximately 61mm to 95mm.  She indicates she first noticed the problem six months ago. She denies  symptoms of itching, burning, irritation and pain.  The following aggravating factors are identified: none. The following alleviating factors are identified: OTC creams/ointments, topical steroids, abstaining from intercourse and analgesics.  Shehas had no previous colposcopy for this condition. The lesion has not been biopsied. She has not had previous treatment for this condition.   PMHx: She  has a past medical history of Allergy, Anxiety, Chronic kidney disease, Depression, Diabetes mellitus without complication ([place]), Hyperlipidemia, and Hypertension. Also,  has a past surgical history that includes Foot surgery (Left, 08/20/2012); Abdominal hysterectomy (1986); and Wisdom tooth extraction (1986).,   family history includes Alcohol abuse in her father; Breast cancer (age of onset: 80) in her mother; Cancer in her mother; Lymphoma in her son; Parkinsonism in her mother.,  reports that  has never smoked. she has never used smokeless tobacco. She reports that she does not drink alcohol or use drugs.  She has a current medication list which includes the following prescription(s): acetaminophen, aspirin ec, cholecalciferol, one touch ultra test, sertraline, and metformin. Also, is allergic to  atorvastatin; lovastatin; metformin and related; metronidazole; statins; wheat bran; and zetia  [ezetimibe].  Review of Systems  Constitutional: Negative.   HENT: Negative.   Eyes: Negative.   Respiratory: Negative.   Cardiovascular: Negative.   Gastrointestinal: Negative.   Genitourinary: Negative.   Musculoskeletal: Negative.   Skin: Negative.   Neurological: Negative.   Endo/Heme/Allergies: Negative.   Psychiatric/Behavioral: Negative.     Objective: BP (!) 146/88 (BP Location: Left Arm, Patient Position: Sitting, Cuff Size: Normal)   Pulse 95   Ht 5\' 2"  (1.575 m)   Wt 182 lb (82.6 kg)   BMI 33.29 kg/m  Physical Exam  Constitutional: She appears well-developed and well-nourished.  Genitourinary: Rectum normal and vagina normal.    Right adnexum does not display mass, does not display tenderness and does not display fullness. Left adnexum does not display mass, does not display tenderness and does not display fullness. Rectal exam shows no external hemorrhoid, no internal hemorrhoid, no mass and anal tone normal.  Genitourinary Comments: Uterus surgically absent.   HENT:  Head: Normocephalic and atraumatic.  Right Ear: External ear normal.  Left Ear: External ear normal.  Mouth/Throat: Oropharynx is clear and moist.  Eyes: Conjunctivae and EOM are normal. Pupils are equal, round, and reactive to light.  Neck: Normal range of motion. Neck supple.  Cardiovascular: Normal rate, regular rhythm and normal heart sounds.  Pulmonary/Chest: Effort normal and breath sounds normal. No stridor. No respiratory distress. She has no wheezes. She has no rales. She exhibits no tenderness.  Abdominal: Soft. Bowel sounds are normal. She exhibits no distension and no mass. There is no tenderness. There is no rebound and no  guarding. No hernia.    ASSESSMENT/PLAN:    Problem List Items Addressed This Visit    None    Visit Diagnoses    Vulvar lesion    -  Primary   Lesion of female  perineum       Labial lesion         Biopsies of left labia and vaginal introitus performed.     VULVAR BIOPSY NOTE The indications for vulvar biopsy (rule out neoplasia, establish lichen sclerosus diagnosis) were reviewed.   Risks of the biopsy including pain, bleeding, infection, inadequate specimen, scarring and need for additional procedures  were discussed. The patient stated understanding and agreed to undergo procedure today. Consent was signed,  time out performed.   The patient's vulva was prepped with Betadine. 1% lidocaine was injected into area of concern. Two 3 -mm punch biopsy was done, biopsy tissue was picked up with sterile forceps and sterile scissors were used to excise the lesions.  Small bleeding was noted and hemostasis was achieved using silver nitrate sticks.  The patient tolerated the procedure well. Post-procedure instructions  (pelvic rest for one week) were given to the patient. The patient is to call with heavy bleeding, fever greater than 100.4, foul smelling vaginal discharge or other concerns.

## 2017-06-06 NOTE — Addendum Note (Signed)
Addended by: Adrian Prows on: 06/06/2017 12:12 PM   Modules accepted: Orders

## 2017-06-12 ENCOUNTER — Other Ambulatory Visit: Payer: Self-pay | Admitting: Obstetrics and Gynecology

## 2017-06-12 DIAGNOSIS — C510 Malignant neoplasm of labium majus: Secondary | ICD-10-CM

## 2017-06-12 DIAGNOSIS — N890 Mild vaginal dysplasia: Secondary | ICD-10-CM

## 2017-06-12 LAB — PATHOLOGY

## 2017-06-12 NOTE — Progress Notes (Signed)
Patient will need an appointment with Gynecology Oncology. Thank you.

## 2017-06-12 NOTE — Progress Notes (Signed)
Referral authorized. Williams will contact the patient directly to schedule an appointment.

## 2017-06-23 ENCOUNTER — Ambulatory Visit: Payer: Medicare Other | Admitting: Obstetrics and Gynecology

## 2017-07-02 ENCOUNTER — Inpatient Hospital Stay: Payer: Medicare Other | Attending: Obstetrics and Gynecology | Admitting: Obstetrics and Gynecology

## 2017-07-02 VITALS — BP 139/85 | HR 89 | Temp 97.8°F | Ht 62.0 in | Wt 180.4 lb

## 2017-07-02 DIAGNOSIS — Z7982 Long term (current) use of aspirin: Secondary | ICD-10-CM | POA: Diagnosis not present

## 2017-07-02 DIAGNOSIS — Z7984 Long term (current) use of oral hypoglycemic drugs: Secondary | ICD-10-CM

## 2017-07-02 DIAGNOSIS — I129 Hypertensive chronic kidney disease with stage 1 through stage 4 chronic kidney disease, or unspecified chronic kidney disease: Secondary | ICD-10-CM | POA: Diagnosis not present

## 2017-07-02 DIAGNOSIS — E7800 Pure hypercholesterolemia, unspecified: Secondary | ICD-10-CM

## 2017-07-02 DIAGNOSIS — E559 Vitamin D deficiency, unspecified: Secondary | ICD-10-CM | POA: Diagnosis not present

## 2017-07-02 DIAGNOSIS — Z79899 Other long term (current) drug therapy: Secondary | ICD-10-CM

## 2017-07-02 DIAGNOSIS — Z90710 Acquired absence of both cervix and uterus: Secondary | ICD-10-CM

## 2017-07-02 DIAGNOSIS — I81 Portal vein thrombosis: Secondary | ICD-10-CM | POA: Diagnosis not present

## 2017-07-02 DIAGNOSIS — C519 Malignant neoplasm of vulva, unspecified: Secondary | ICD-10-CM | POA: Diagnosis not present

## 2017-07-02 DIAGNOSIS — E1122 Type 2 diabetes mellitus with diabetic chronic kidney disease: Secondary | ICD-10-CM

## 2017-07-02 DIAGNOSIS — N189 Chronic kidney disease, unspecified: Secondary | ICD-10-CM | POA: Diagnosis not present

## 2017-07-02 DIAGNOSIS — N893 Dysplasia of vagina, unspecified: Secondary | ICD-10-CM

## 2017-07-02 NOTE — Progress Notes (Signed)
  Oncology Nurse Navigator Documentation Chaperoned pelvic exam. Return in 2 months for follow up with Dr. Fransisca Connors. Navigator Location: [facility] (07/02/17 1300)   )Navigator Encounter Type: Initial GynOnc (07/02/17 1300)                     Patient Visit Type: GynOnc;Initial (07/02/17 1300)                              Time Spent with Patient: 15 (07/02/17 1300)

## 2017-07-02 NOTE — Progress Notes (Addendum)
Gynecologic Oncology Consult Visit   Referring Provider: Dr. 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 P3 female who is seen in consultation from Dr. Marlyn Corporal for [facility].  She saw Dr. Gilman Schmidt complaining of two lesions. One lesion had been present on her left labia for 6 months. The second lesion was present at her vaginal introitus for 1 month.  She denies  symptoms of itching, burning, irritation and pain. Does not smoke or use immunosuppressive drugs.  Has history of excision of basal cell cancers elsewhere on her body.  Prior hysterectomy for benign condition many years ago.    Biopsy 06/06/17  Part A: VULVA, LEFT LABIA, BIOPSY:  BASAL CELL CARCINOMA, NODULAR-ULCERATIVE TYPE.  LATERAL MARGINS INVOLVED.  Part B: VAGINA INTROITUS, BIOPSY:  LOW-GRADE SQUAMOUS INTRAEPITHELIAL LESION (VAIN1).  COMMENT: Based on the initial morphologic findings immunohistochemical  stains are evaluated. In Part B, immunohistochemical stain for [id number]  shows superficial and patchy positivity in the dysplastic epithelium.  Immunohistochemical stain for Ki-67 shows proliferation only in the basal  area. These findings are consistent with low grade dysplasia. Additional  sections were examined for diagnostic purposes (Part A).   Problem List: Patient Active Problem List   Diagnosis Date Noted  . Arthritis 07/06/2015  . Baker's cyst of knee 07/06/2015  . Abnormal kidney function 04/04/2015  . Anxiety 04/04/2015  . Dermatitis, eczematoid 04/04/2015  . Diabetes mellitus, type 2 ([place]) 04/04/2015  . Abnormal liver enzymes 04/04/2015  . Hepatitis 04/04/2015  . Hypercholesteremia 04/04/2015  . Calculus of kidney 04/04/2015  . 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 Allergic rhinitis 04/04/2015  . Fatigue 04/04/2015  . 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 Dermatophytosis of groin 04/04/2015  . Urge incontinence 10/12/2012  . Renal colic 60/04/9322  .  Avitaminosis D 09/29/2009  . Clinical depression 09/20/2009  . Essential (primary) hypertension 09/20/2009  . Carpal tunnel syndrome 01/17/2009  . Bursitis 01/16/2009    Past Medical History: Past Medical History:  Diagnosis Date  . Allergy   . Anxiety   . Chronic kidney disease   . Depression   . Diabetes mellitus without complication ([place])   . Hyperlipidemia   . Hypertension     Past Surgical History: Past Surgical History:  Procedure Laterality Date  . ABDOMINAL HYSTERECTOMY  1986  . FOOT SURGERY Left 08/20/2012   Dr. Elvina Mattes  . WISDOM TOOTH EXTRACTION  1986    OB History:  OB History  Gravida Para Term Preterm AB Living  3 3          SAB TAB Ectopic Multiple Live Births               # Outcome Date GA Lbr Len/2nd Weight Sex Delivery Anes PTL Lv  3 Para           2 Para           1 Para               Family History: Family History  Problem Relation Age of Onset  . Cancer Mother        breast  . Parkinsonism Mother   . Breast cancer Mother 64  . Alcohol abuse Father   . Lymphoma Son     Social History: Social History   Socioeconomic History  . Marital status: Married    Spouse name: Not on file  . Number of children: Not on file  . Years of  education: Not on file  . Highest education level: Not on file  Social Needs  . Financial resource strain: Patient refused  . Food insecurity - worry: Patient refused  . Food insecurity - inability: Patient refused  . Transportation needs - medical: Patient refused  . Transportation needs - non-medical: Patient refused  Occupational History  . Not on file  Tobacco Use  . Smoking status: Never Smoker  . Smokeless tobacco: Never Used  Substance and Sexual Activity  . Alcohol use: No  . Drug use: No  . Sexual activity: No  Other Topics Concern  . Not on file  Social History Narrative  . Not on file    Allergies: Allergies  Allergen Reactions  . Atorvastatin Other (See Comments)    Muscle spasms   . Lovastatin     muscle aches  . Metronidazole     no further information given.  . Statins Other (See Comments)  . Wheat Bran Swelling  . Zetia  [Ezetimibe]     muscle aches    Current Medications: Current Outpatient Medications  Medication Sig Dispense Refill  . acetaminophen (TYLENOL) 500 MG tablet Take 2 tablets (1,000 mg total) by mouth every 8 (eight) hours as needed. 1 tablet 0  . aspirin EC 81 MG tablet Take 1 tablet (81 mg total) by mouth daily. 1 tablet 0  . cholecalciferol (VITAMIN D) 1000 UNITS tablet Take 1 tablet by mouth daily.    . metFORMIN (GLUCOPHAGE) 500 MG tablet TAKE 1 TABLET BY MOUTH TWICE A DAY (Patient not taking: Reported on 06/03/2017) 180 tablet 1  . ONE TOUCH ULTRA TEST test strip CHECK BLOOD SUGAR ONCE DAILY 50 each 6  . sertraline (ZOLOFT) 100 MG tablet TAKE ONE TABLET BY MOUTH EVERY DAY AT BEDTIME 90 tablet 1   No current facility-administered medications for this visit.     Review of Systems General: negative for, fevers, chills, fatigue, changes in sleep, changes in weight or appetite Skin: negative for lesions Eyes: negative for, changes in vision, pain, diplopia HEENT: negative for, change in hearing, pain, discharge, tinnitus, vertigo, voice changes, sore throat, neck masses Breasts: negative for breast lumps Pulmonary: negative for, dyspnea, orthopnea, productive cough Cardiac: negative for, palpitations, syncope, pain, discomfort, pressure Gastrointestinal: negative for, dysphagia, nausea, vomiting, jaundice, pain, constipation, diarrhea, hematemesis, hematochezia Genitourinary/Sexual: negative for, dysuria, discharge, hesitancy, nocturia, retention, stones, infections, STD's, incontinence Ob/Gyn: negative for, irregular bleeding, pain Musculoskeletal: negative for, pain, stiffness, swelling, range of motion limitation Hematology: negative for, easy bruising, bleeding Neurologic/Psych: negative for, headaches, seizures, paralysis, weakness,  tremor, change in gait, change in sensation, mood swings, depression, anxiety, change in memory  Objective:  Physical Examination:  BP 139/85   Pulse 89   Temp 97.8 F (36.6 C) (Tympanic)   Ht _0  (1.575 m)   Wt 180 lb 6.4 oz (81.8 kg)   BMI 33.00 kg/m    ECOG Performance Status: 1 - Symptomatic but completely ambulatory  General appearance: alert, cooperative and appears stated age HEENT:PERRLA, neck supple with midline trachea and thyroid without masses Lymph node survey: non-palpable, axillary, inguinal, supraclavicular Cardiovascular: regular rate and rhythm, no murmurs or gallops Respiratory: normal air entry, lungs clear to auscultation and no rales, rhonchi or wheezing Abdomen: soft, non-tender, without masses or organomegaly, no hernias and well healed incision Back: inspection of back is normal Extremities: extremities normal, atraumatic, no cyanosis or edema Skin exam - normal coloration and turgor, no rashes, no suspicious skin lesions noted. Neurological exam reveals  alert, oriented, normal speech, no focal findings or movement disorder noted.  Pelvic: exam chaperoned by nurse;  Vulva: biopsy sites at 2 and 6 o'clock healing well; Vagina: normal vagina; Adnexa: normal adnexa in size, nontender and no masses;  Rectal: not indicated.  Colposcopy performed with acetic acid.  Healing biopsy sites noted, but no evidence of persistent lesions.    Assessment:  [name] [name] is a 66 y.o. 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  Medical co-morbidities complicating care:  Plan:   Problem List Items Addressed This Visit    None    Visit Diagnoses    VAIN (vaginal intraepithelial neoplasia)    -  Primary     No evidence of persistent disease on exam today, but biopsy sites still healing.  No additional lesions seen.  Will see her back in 2 months for follow up colposcopy to assess for persistent disease in view of positive margin with 2 o'clock  biopsy showing basal cell carcinoma.   The patient's diagnosis, an outline of the further diagnostic and laboratory studies which will be required, the recommendation, and alternatives were discussed.  All questions were answered to the patient's satisfaction.  A total of 40 minutes were spent with the patient/family today; 50% was spent in education, counseling and coordination of care for vulvar lesions.  Mellody Drown, MD    CC:  [address] [place], [place] [address] [phone]

## 2017-07-02 NOTE — Progress Notes (Signed)
No compaints. Nervous about first time coming to cancer center

## 2017-09-03 ENCOUNTER — Ambulatory Visit: Payer: Medicare Other

## 2017-11-18 ENCOUNTER — Encounter: Payer: Self-pay | Admitting: Physician Assistant

## 2017-11-18 ENCOUNTER — Ambulatory Visit: Payer: Medicare Other | Admitting: Physician Assistant

## 2017-11-18 VITALS — BP 122/82 | HR 83 | Temp 98.2°F | Wt 179.6 lb

## 2017-11-18 DIAGNOSIS — R059 Cough, unspecified: Secondary | ICD-10-CM

## 2017-11-18 DIAGNOSIS — R05 Cough: Secondary | ICD-10-CM

## 2017-11-18 DIAGNOSIS — R0982 Postnasal drip: Secondary | ICD-10-CM

## 2017-11-18 DIAGNOSIS — J040 Acute laryngitis: Secondary | ICD-10-CM | POA: Diagnosis not present

## 2017-11-18 MED ORDER — FLUTICASONE PROPIONATE 50 MCG/ACT NA SUSP: 2.0000 | Freq: Every day | NASAL | 6 refills | Status: DC

## 2017-11-18 MED ORDER — BENZONATATE 200 MG PO CAPS: 200.0000 mg | ORAL_CAPSULE | Freq: Two times a day (BID) | ORAL | 0 refills | Status: DC | PRN

## 2017-11-18 NOTE — Patient Instructions (Signed)
Laryngitis Laryngitis is inflammation of your vocal cords. This causes hoarseness, coughing, loss of voice, sore throat, or a dry throat. Your vocal cords are two bands of muscles that are found in your throat. When you speak, these cords come together and vibrate. These vibrations come out through your mouth as sound. When your vocal cords are inflamed, your voice sounds different. Laryngitis can be temporary (acute) or long-term (chronic). Most cases of acute laryngitis improve with time. Chronic laryngitis is laryngitis that lasts for more than three weeks. What are the causes? Acute laryngitis may be caused by:  A viral infection.  Lots of talking, yelling, or singing. This is also called vocal strain.  Bacterial infections.  Chronic laryngitis may be caused by:  Vocal strain.  Injury to your vocal cords.  Acid reflux (gastroesophageal reflux disease or GERD).  Allergies.  Sinus infection.  Smoking.  Alcohol abuse.  Breathing in chemicals or dust.  Growths on the vocal cords.  What increases the risk? Risk factors for laryngitis include:  Smoking.  Alcohol abuse.  Having allergies.  What are the signs or symptoms? Symptoms of laryngitis may include:  Low, hoarse voice.  Loss of voice.  Dry cough.  Sore throat.  Stuffy nose.  How is this diagnosed? Laryngitis may be diagnosed by:  Physical exam.  Throat culture.  Blood test.  Laryngoscopy. This procedure allows your health care provider to look at your vocal cords with a mirror or viewing tube.  How is this treated? Treatment for laryngitis depends on what is causing it. Usually, treatment involves resting your voice and using medicines to soothe your throat. However, if your laryngitis is caused by a bacterial infection, you may need to take antibiotic medicine. If your laryngitis is caused by a growth, you may need to have a procedure to remove it. Follow these instructions at home:  Drink  enough fluid to keep your urine clear or pale yellow.  Breathe in moist air. Use a humidifier if you live in a dry climate.  Take medicines only as directed by your health care provider.  If you were prescribed an antibiotic medicine, finish it all even if you start to feel better.  Do not smoke cigarettes or electronic cigarettes. If you need help quitting, ask your health care provider.  Talk as little as possible. Also avoid whispering, which can cause vocal strain.  Write instead of talking. Do this until your voice is back to normal. Contact a health care provider if:  You have a fever.  You have increasing pain.  You have difficulty swallowing. Get help right away if:  You cough up blood.  You have trouble breathing. This information is not intended to replace advice given to you by your health care provider. Make sure you discuss any questions you have with your health care provider. Document Released: 07/15/2005 Document Revised: 12/21/2015 Document Reviewed: 12/28/2013 Elsevier Interactive Patient Education  2018 [employer]  

## 2017-11-18 NOTE — Progress Notes (Signed)
Patient: TRACYE SZUCH Female    DOB: March 11, 1951   67 y.o.   MRN: [id number] Visit Date: 11/18/2017  Today's Provider: Mar Daring, PA-C   Chief Complaint  Patient presents with  . URI   Subjective:    URI   This is a new problem. Episode onset: Sunday. The problem has been gradually worsening. There has been no fever. Associated symptoms include congestion, coughing and a sore throat. Pertinent negatives include no ear pain, rhinorrhea or sinus pain. Associated symptoms comments: Hoarseness . Treatments tried: OTC sinus and congestion medication. The treatment provided mild relief.    Allergies  Allergen Reactions  . Atorvastatin Other (See Comments)    Muscle spasms  . Lovastatin     muscle aches  . Statins Other (See Comments)  . Wheat Bran Swelling  . Zetia  [Ezetimibe]     muscle aches  . Metronidazole Nausea And Vomiting and Rash     Current Outpatient Medications:  .  acetaminophen (TYLENOL) 500 MG tablet, Take 2 tablets (1,000 mg total) by mouth every 8 (eight) hours as needed., Disp: 1 tablet, Rfl: 0 .  aspirin EC 81 MG tablet, Take 1 tablet (81 mg total) by mouth daily., Disp: 1 tablet, Rfl: 0 .  Calcium-Magnesium-Vitamin D (CALCIUM 1200+D3 PO), Take 1 capsule by mouth daily. Vitamin d 3 is 1000 IU, Disp: , Rfl:  .  Cholecalciferol (VITAMIN D3) 2000 units TABS, Take 1 capsule by mouth daily., Disp: , Rfl:  .  ONE TOUCH ULTRA TEST test strip, CHECK BLOOD SUGAR ONCE DAILY, Disp: 50 each, Rfl: 6 .  sertraline (ZOLOFT) 100 MG tablet, TAKE ONE TABLET BY MOUTH EVERY DAY AT BEDTIME, Disp: 90 tablet, Rfl: 1 .  metFORMIN (GLUCOPHAGE) 500 MG tablet, TAKE 1 TABLET BY MOUTH TWICE A DAY (Patient not taking: Reported on 06/03/2017), Disp: 180 tablet, Rfl: 1  Review of Systems  Constitutional: Negative.   HENT: Positive for congestion, postnasal drip, sore throat and voice change. Negative for ear pain, rhinorrhea, sinus pressure and sinus pain.   Respiratory:  Positive for cough.   Cardiovascular: Negative.   Gastrointestinal: Negative.   Neurological: Negative.     Social History   Tobacco Use  . Smoking status: Never Smoker  . Smokeless tobacco: Never Used  Substance Use Topics  . Alcohol use: No   Objective:   BP 122/82 (BP Location: Right Arm, Patient Position: Sitting, Cuff Size: Normal)   Pulse 83   Temp 98.2 F (36.8 C) (Oral)   Wt 179 lb 9.6 oz (81.5 kg)   SpO2 97%   BMI 32.85 kg/m    Physical Exam  Constitutional: She appears well-developed and well-nourished. No distress.  HENT:  Head: Normocephalic and atraumatic.  Right Ear: Hearing, tympanic membrane, external ear and ear canal normal.  Left Ear: Hearing, tympanic membrane, external ear and ear canal normal.  Nose: Nose normal.  Mouth/Throat: Uvula is midline and mucous membranes are normal. Posterior oropharyngeal erythema present. No oropharyngeal exudate or posterior oropharyngeal edema.  Hoarse voice  Eyes: Pupils are equal, round, and reactive to light. Conjunctivae are normal. Right eye exhibits no discharge. Left eye exhibits no discharge. No scleral icterus.  Neck: Normal range of motion. Neck supple. No tracheal deviation present. No thyromegaly present.  Cardiovascular: Normal rate, regular rhythm and normal heart sounds. Exam reveals no gallop and no friction rub.  No murmur heard. Pulmonary/Chest: Effort normal and breath sounds normal. No stridor. No respiratory  distress. She has no wheezes. She has no rales.  Lymphadenopathy:    She has no cervical adenopathy.  Skin: Skin is warm and dry. She is not diaphoretic.  Vitals reviewed.       Assessment & Plan:     1. Laryngitis Advised to treat conservatively as most likely viral in nature. Voice rest, salt water gargels, chloraseptic spray prn. IBU or tylenol prn. Call if symptoms worsen.   2. Cough Push fluids and tessalon perles for cough - benzonatate (TESSALON) 200 MG capsule; Take 1 capsule  (200 mg total) by mouth 2 (two) times daily as needed for cough.  Dispense: 20 capsule; Refill: 0  3. Post-nasal drainage Flonase for post nasal drainage that I feel is causing both laryngitis and cough. - fluticasone (FLONASE) 50 MCG/ACT nasal spray; Place 2 sprays into both nostrils daily.  Dispense: 16 g; Refill: Donald, PA-C  [facility]

## 2018-01-23 ENCOUNTER — Ambulatory Visit (INDEPENDENT_AMBULATORY_CARE_PROVIDER_SITE_OTHER): Payer: Medicare Other

## 2018-01-23 VITALS — BP 134/72 | HR 82 | Temp 98.5°F | Ht 62.0 in | Wt 182.4 lb

## 2018-01-23 DIAGNOSIS — Z0000 Encounter for general adult medical examination without abnormal findings: Secondary | ICD-10-CM

## 2018-01-23 NOTE — Patient Instructions (Addendum)
[name] [name] , Thank you for taking time to come for your Medicare Wellness Visit. I appreciate your ongoing commitment to your health goals. Please review the following plan we discussed and let me know if I can assist you in the future.   Screening recommendations/referrals: Colonoscopy: Pt declines today.  Mammogram: Up to date Bone Density: Up to date Recommended yearly ophthalmology/optometry visit for glaucoma screening and checkup Recommended yearly dental visit for hygiene and checkup  Vaccinations: Influenza vaccine: N/A Pneumococcal vaccine: Pt declines today.  Tdap vaccine: Up to date Shingles vaccine: Pt declines today.     Advanced directives: Advance directive discussed with you today. I have provided a copy for you to complete at home and have notarized. Once this is complete please bring a copy in to our office so we can scan it into your chart.  Conditions/risks identified: Obesity- recommend monitoring intake of sugar and avoiding to help aid in weight loss.   Next appointment: 01/26/18 @ 9 AM with Fenton Malling.   Preventive Care 15 Years and Older, Female Preventive care refers to lifestyle choices and visits with your health care provider that can promote health and wellness. What does preventive care include?  A yearly physical exam. This is also called an annual well check.  Dental exams once or twice a year.  Routine eye exams. Ask your health care provider how often you should have your eyes checked.  Personal lifestyle choices, including:  Daily care of your teeth and gums.  Regular physical activity.  Eating a healthy diet.  Avoiding tobacco and drug use.  Limiting alcohol use.  Practicing safe sex.  Taking low-dose aspirin every day.  Taking vitamin and mineral supplements as recommended by your health care provider. What happens during an annual well check? The services and screenings done by your health care provider during your  annual well check will depend on your age, overall health, lifestyle risk factors, and family history of disease. Counseling  Your health care provider may ask you questions about your:  Alcohol use.  Tobacco use.  Drug use.  Emotional well-being.  Home and relationship well-being.  Sexual activity.  Eating habits.  History of falls.  Memory and ability to understand (cognition).  Work and work Statistician.  Reproductive health. Screening  You may have the following tests or measurements:  Height, weight, and BMI.  Blood pressure.  Lipid and cholesterol levels. These may be checked every 5 years, or more frequently if you are over 79 years old.  Skin check.  Lung cancer screening. You may have this screening every year starting at age 64 if you have a 30-pack-year history of smoking and currently smoke or have quit within the past 15 years.  Fecal occult blood test (FOBT) of the stool. You may have this test every year starting at age 2.  Flexible sigmoidoscopy or colonoscopy. You may have a sigmoidoscopy every 5 years or a colonoscopy every 10 years starting at age 57.  Hepatitis C blood test.  Hepatitis B blood test.  Sexually transmitted disease (STD) testing.  Diabetes screening. This is done by checking your blood sugar (glucose) after you have not eaten for a while (fasting). You may have this done every 1-3 years.  Bone density scan. This is done to screen for osteoporosis. You may have this done starting at age 38.  Mammogram. This may be done every 1-2 years. Talk to your health care provider about how often you should have regular mammograms. Talk  with your health care provider about your test results, treatment options, and if necessary, the need for more tests. Vaccines  Your health care provider may recommend certain vaccines, such as:  Influenza vaccine. This is recommended every year.  Tetanus, diphtheria, and acellular pertussis (Tdap, Td)  vaccine. You may need a Td booster every 10 years.  Zoster vaccine. You may need this after age 21.  Pneumococcal 13-valent conjugate (PCV13) vaccine. One dose is recommended after age 44.  Pneumococcal polysaccharide (PPSV23) vaccine. One dose is recommended after age 27. Talk to your health care provider about which screenings and vaccines you need and how often you need them. This information is not intended to replace advice given to you by your health care provider. Make sure you discuss any questions you have with your health care provider. Document Released: 08/11/2015 Document Revised: 04/03/2016 Document Reviewed: 05/16/2015 Elsevier Interactive Patient Education  2017 [place] Prevention in the Home Falls can cause injuries. They can happen to people of all ages. There are many things you can do to make your home safe and to help prevent falls. What can I do on the outside of my home?  Regularly fix the edges of walkways and driveways and fix any cracks.  Remove anything that might make you trip as you walk through a door, such as a raised step or threshold.  Trim any bushes or trees on the path to your home.  Use bright outdoor lighting.  Clear any walking paths of anything that might make someone trip, such as rocks or tools.  Regularly check to see if handrails are loose or broken. Make sure that both sides of any steps have handrails.  Any raised decks and porches should have guardrails on the edges.  Have any leaves, snow, or ice cleared regularly.  Use sand or salt on walking paths during winter.  Clean up any spills in your garage right away. This includes oil or grease spills. What can I do in the bathroom?  Use night lights.  Install grab bars by the toilet and in the tub and shower. Do not use towel bars as grab bars.  Use non-skid mats or decals in the tub or shower.  If you need to sit down in the shower, use a plastic, non-slip  stool.  Keep the floor dry. Clean up any water that spills on the floor as soon as it happens.  Remove soap buildup in the tub or shower regularly.  Attach bath mats securely with double-sided non-slip rug tape.  Do not have throw rugs and other things on the floor that can make you trip. What can I do in the bedroom?  Use night lights.  Make sure that you have a light by your bed that is easy to reach.  Do not use any sheets or blankets that are too big for your bed. They should not hang down onto the floor.  Have a firm chair that has side arms. You can use this for support while you get dressed.  Do not have throw rugs and other things on the floor that can make you trip. What can I do in the kitchen?  Clean up any spills right away.  Avoid walking on wet floors.  Keep items that you use a lot in easy-to-reach places.  If you need to reach something above you, use a strong step stool that has a grab bar.  Keep electrical cords out of the way.  Do  not use floor polish or wax that makes floors slippery. If you must use wax, use non-skid floor wax.  Do not have throw rugs and other things on the floor that can make you trip. What can I do with my stairs?  Do not leave any items on the stairs.  Make sure that there are handrails on both sides of the stairs and use them. Fix handrails that are broken or loose. Make sure that handrails are as long as the stairways.  Check any carpeting to make sure that it is firmly attached to the stairs. Fix any carpet that is loose or worn.  Avoid having throw rugs at the top or bottom of the stairs. If you do have throw rugs, attach them to the floor with carpet tape.  Make sure that you have a light switch at the top of the stairs and the bottom of the stairs. If you do not have them, ask someone to add them for you. What else can I do to help prevent falls?  Wear shoes that:  Do not have high heels.  Have rubber bottoms.  Are  comfortable and fit you well.  Are closed at the toe. Do not wear sandals.  If you use a stepladder:  Make sure that it is fully opened. Do not climb a closed stepladder.  Make sure that both sides of the stepladder are locked into place.  Ask someone to hold it for you, if possible.  Clearly mark and make sure that you can see:  Any grab bars or handrails.  First and last steps.  Where the edge of each step is.  Use tools that help you move around (mobility aids) if they are needed. These include:  Canes.  Walkers.  Scooters.  Crutches.  Turn on the lights when you go into a dark area. Replace any light bulbs as soon as they burn out.  Set up your furniture so you have a clear path. Avoid moving your furniture around.  If any of your floors are uneven, fix them.  If there are any pets around you, be aware of where they are.  Review your medicines with your doctor. Some medicines can make you feel dizzy. This can increase your chance of falling. Ask your doctor what other things that you can do to help prevent falls. This information is not intended to replace advice given to you by your health care provider. Make sure you discuss any questions you have with your health care provider. Document Released: 05/11/2009 Document Revised: 12/21/2015 Document Reviewed: 08/19/2014 Elsevier Interactive Patient Education  2017 [employer].

## 2018-01-23 NOTE — Progress Notes (Signed)
Subjective:   [name] [name] is a 67 y.o. female who presents for Medicare Annual (Subsequent) preventive examination.  Review of Systems:  N/A  Cardiac Risk Factors include: advanced age (>19men, >41 women);diabetes mellitus;hypertension;obesity (BMI >30kg/m2)     Objective:     Vitals: BP 134/72 (BP Location: Right Arm)   Pulse 82   Temp 98.5 F (36.9 C) (Oral)   Ht 5\' 2"  (1.575 m)   Wt 182 lb 6.4 oz (82.7 kg)   BMI 33.36 kg/m   Body mass index is 33.36 kg/m.  Advanced Directives 01/23/2018 07/02/2017 01/22/2017 07/06/2015 04/05/2015  Does Patient Have a Medical Advance Directive? No No No No No  Does patient want to make changes to medical advance directive? Yes (MAU/Ambulatory/Procedural Areas - Information given) - - - -  Would patient like information on creating a medical advance directive? - Yes (MAU/Ambulatory/Procedural Areas - Information given) - - -    Tobacco Social History   Tobacco Use  Smoking Status Never Smoker  Smokeless Tobacco Never Used     Counseling given: Not Answered   Clinical Intake:  Pre-visit preparation completed: Yes  Pain : No/denies pain Pain Score: 0-No pain     Nutritional Status: BMI > 30  Obese Nutritional Risks: None Diabetes: Yes(type 2) CBG done?: No Did pt. bring in CBG monitor from home?: No  How often do you need to have someone help you when you read instructions, pamphlets, or other written materials from your doctor or pharmacy?: 1 - Never  Interpreter Needed?: No  Information entered by :: [facility], LPN  Past Medical History:  Diagnosis Date  . Allergy   . Anxiety   . Arthritis   . Basal cell carcinoma (BCC) in situ of skin   . Chronic kidney disease   . Depression   . Diabetes mellitus without complication ([place])   . Genital warts    one time a long time ago  . Hyperlipidemia   . Hypertension   . Portal vein thrombosis   . VAIN I (vaginal intraepithelial neoplasia grade I)    Past Surgical  History:  Procedure Laterality Date  . ABDOMINAL HYSTERECTOMY  1986  . BLADDER SURGERY     at the same time of hysterectomy  . FOOT SURGERY Left 08/20/2012   Dr. Elvina Mattes  . WISDOM TOOTH EXTRACTION  1986   Family History  Problem Relation Age of Onset  . Parkinsonism Mother   . Breast cancer Mother 44  . Alcohol abuse Father   . Lymphoma Son    Social History   Socioeconomic History  . Marital status: Married    Spouse name: Remo Lipps  . Number of children: 3  . Years of education: 4 years college  . Highest education level: Bachelor's degree (e.g., BA, AB, BS)  Occupational History  . Occupation: retired Pharmacist, hospital  . Occupation: Press photographer  Social Needs  . Financial resource strain: Not hard at all  . Food insecurity:    Worry: Never true    Inability: Never true  . Transportation needs:    Medical: No    Non-medical: No  Tobacco Use  . Smoking status: Never Smoker  . Smokeless tobacco: Never Used  Substance and Sexual Activity  . Alcohol use: No  . Drug use: No  . Sexual activity: Never  Lifestyle  . Physical activity:    Days per week: 4 days    Minutes per session: 30 min  . Stress: Not at all  Relationships  .  Social connections:    Talks on phone: More than three times a week    Gets together: More than three times a week    Attends religious service: Never    Active member of club or organization: No    Attends meetings of clubs or organizations: Never    Relationship status: Patient refused  Other Topics Concern  . Not on file  Social History Narrative  . Not on file    Outpatient Encounter Medications as of 01/23/2018  Medication Sig  . acetaminophen (TYLENOL) 500 MG tablet Take 2 tablets (1,000 mg total) by mouth every 8 (eight) hours as needed.  Marland Kitchen aspirin EC 81 MG tablet Take 1 tablet (81 mg total) by mouth daily.  . Calcium-Magnesium-Vitamin D (CALCIUM 1200+D3 PO) Take 1 capsule by mouth daily. Vitamin d 3 is 1000 IU  . Cholecalciferol (VITAMIN  D3) 2000 units TABS Take 1 capsule by mouth daily.  . ONE TOUCH ULTRA TEST test strip CHECK BLOOD SUGAR ONCE DAILY  . sertraline (ZOLOFT) 100 MG tablet TAKE ONE TABLET BY MOUTH EVERY DAY AT BEDTIME  . benzonatate (TESSALON) 200 MG capsule Take 1 capsule (200 mg total) by mouth 2 (two) times daily as needed for cough. (Patient not taking: Reported on 01/23/2018)  . fluticasone (FLONASE) 50 MCG/ACT nasal spray Place 2 sprays into both nostrils daily.  . metFORMIN (GLUCOPHAGE) 500 MG tablet TAKE 1 TABLET BY MOUTH TWICE A DAY (Patient not taking: Reported on 06/03/2017)   No facility-administered encounter medications on file as of 01/23/2018.     Activities of Daily Living In your present state of health, do you have any difficulty performing the following activities: 01/23/2018  Hearing? N  Vision? N  Difficulty concentrating or making decisions? N  Walking or climbing stairs? N  Dressing or bathing? N  Doing errands, shopping? N  Preparing Food and eating ? N  Using the Toilet? N  In the past six months, have you accidently leaked urine? Y  Comment Occasionally with pressure  Do you have problems with loss of bowel control? N  Managing your Medications? N  Managing your Finances? N  Housekeeping or managing your Housekeeping? N  Some recent data might be hidden    Patient Care Team: Rubye Beach as PCP - General (Family Medicine) Leata Mouse (Inactive) as Consulting Physician Arelia Sneddon, [place] as Consulting Physician (Optometry)    Assessment:   This is a routine wellness examination for Annella.  Exercise Activities and Dietary recommendations Current Exercise Habits: The patient does not participate in regular exercise at present, Exercise limited by: None identified  Goals    . DIET - REDUCE SUGAR INTAKE     Recommend monitoring intake of sugar and avoiding to help aid in weight loss.        Fall Risk Fall Risk  01/23/2018 01/22/2017  Falls in the  past year? No No   Is the patient's home free of loose throw rugs in walkways, pet beds, electrical cords, etc?   yes      Grab bars in the bathroom? yes      Handrails on the stairs?   no      Adequate lighting?   yes  Timed Get Up and Go performed: N/A  Depression Screen PHQ 2/9 Scores 01/23/2018 01/22/2017 09/30/2016  PHQ - 2 Score 0 0 0  PHQ- 9 Score - 0 0     Cognitive Function     6CIT Screen 01/23/2018  What Year? 0 points  What month? 0 points  What time? 0 points  Count back from 20 0 points  Months in reverse 0 points  Repeat phrase 0 points  Total Score 0    Immunization History  Administered Date(s) Administered  . Td 12/14/2000  . Tdap 10/07/2012    Qualifies for Shingles Vaccine? Due for Shingles vaccine. Declined my offer to administer today. Education has been provided regarding the importance of this vaccine. Pt has been advised to call her insurance company to determine her out of pocket expense. Advised she may also receive this vaccine at her local pharmacy or Health Dept. Verbalized acceptance and understanding.  Screening Tests Health Maintenance  Topic Date Due  . FOOT EXAM  09/20/1961  . COLONOSCOPY  09/20/2001  . OPHTHALMOLOGY EXAM  05/11/2016  . URINE MICROALBUMIN  07/05/2016  . PNA vac Low Risk Adult (1 of 2 - PCV13) 09/20/2016  . HEMOGLOBIN A1C  07/30/2017  . INFLUENZA VACCINE  02/26/2018  . MAMMOGRAM  05/07/2019  . TETANUS/TDAP  10/08/2022  . DEXA SCAN  Completed  . Hepatitis C Screening  Completed    Cancer Screenings: Lung: Low Dose CT Chest recommended if Age 77-80 years, 30 pack-year currently smoking OR have quit w/in 15years. Patient does not qualify. Breast:  Up to date on Mammogram? Yes   Up to date of Bone Density/Dexa? Yes Colorectal: Pt declines today.   Additional Screenings:  Hepatitis C Screening: Up to date     Plan:  I have personally reviewed and addressed the Medicare Annual Wellness questionnaire and have noted  the following in the patient's chart:  A. Medical and social history B. Use of alcohol, tobacco or illicit drugs  C. Current medications and supplements D. Functional ability and status E.  Nutritional status F.  Physical activity G. Advance directives H. List of other physicians I.  Hospitalizations, surgeries, and ER visits in previous 12 months J.  [place] such as hearing and vision if needed, cognitive and depression L. Referrals and appointments - none  In addition, I have reviewed and discussed with patient certain preventive protocols, quality metrics, and best practice recommendations. A written personalized care plan for preventive services as well as general preventive health recommendations were provided to patient.  See attached scanned questionnaire for additional information.   Signed,  Fabio Neighbors, LPN Nurse Health Advisor   Nurse Recommendations: Pt needs a diabetic foot exam, urine microalbumin and Hgb A1c checked at next OV. Pt declined the colonoscopy referral today. Information given on the cologuard and pt would like to speak with PCP further about this. Pt declined the Prevnar 13 vaccine today.

## 2018-01-26 ENCOUNTER — Encounter: Payer: Self-pay | Admitting: Physician Assistant

## 2018-01-26 ENCOUNTER — Ambulatory Visit (INDEPENDENT_AMBULATORY_CARE_PROVIDER_SITE_OTHER): Payer: Medicare Other | Admitting: Physician Assistant

## 2018-01-26 VITALS — BP 116/80 | HR 74 | Temp 98.4°F | Resp 16 | Ht 62.0 in | Wt 181.0 lb

## 2018-01-26 DIAGNOSIS — E119 Type 2 diabetes mellitus without complications: Secondary | ICD-10-CM

## 2018-01-26 DIAGNOSIS — I10 Essential (primary) hypertension: Secondary | ICD-10-CM | POA: Diagnosis not present

## 2018-01-26 DIAGNOSIS — Z0000 Encounter for general adult medical examination without abnormal findings: Secondary | ICD-10-CM | POA: Diagnosis not present

## 2018-01-26 DIAGNOSIS — R748 Abnormal levels of other serum enzymes: Secondary | ICD-10-CM

## 2018-01-26 DIAGNOSIS — E7800 Pure hypercholesterolemia, unspecified: Secondary | ICD-10-CM | POA: Diagnosis not present

## 2018-01-26 DIAGNOSIS — Z2821 Immunization not carried out because of patient refusal: Secondary | ICD-10-CM

## 2018-01-26 LAB — POCT UA - MICROALBUMIN: MICROALBUMIN (UR) POC: 50 mg/L

## 2018-01-26 NOTE — Patient Instructions (Signed)
Health Maintenance for Postmenopausal Women Menopause is a normal process in which your reproductive ability comes to an end. This process happens gradually over a span of months to years, usually between the ages of 22 and 9. Menopause is complete when you have missed 12 consecutive menstrual periods. It is important to talk with your health care provider about some of the most common conditions that affect postmenopausal women, such as heart disease, cancer, and bone loss (osteoporosis). Adopting a healthy lifestyle and getting preventive care can help to promote your health and wellness. Those actions can also lower your chances of developing some of these common conditions. What should I know about menopause? During menopause, you may experience a number of symptoms, such as:  Moderate-to-severe hot flashes.  Night sweats.  Decrease in sex drive.  Mood swings.  Headaches.  Tiredness.  Irritability.  Memory problems.  Insomnia.  Choosing to treat or not to treat menopausal changes is an individual decision that you make with your health care provider. What should I know about hormone replacement therapy and supplements? Hormone therapy products are effective for treating symptoms that are associated with menopause, such as hot flashes and night sweats. Hormone replacement carries certain risks, especially as you become older. If you are thinking about using estrogen or estrogen with progestin treatments, discuss the benefits and risks with your health care provider. What should I know about heart disease and stroke? Heart disease, heart attack, and stroke become more likely as you age. This may be due, in part, to the hormonal changes that your body experiences during menopause. These can affect how your body processes dietary fats, triglycerides, and cholesterol. Heart attack and stroke are both medical emergencies. There are many things that you can do to help prevent heart disease  and stroke:  Have your blood pressure checked at least every 1-2 years. High blood pressure causes heart disease and increases the risk of stroke.  If you are 53-22 years old, ask your health care provider if you should take aspirin to prevent a heart attack or a stroke.  Do not use any tobacco products, including cigarettes, chewing tobacco, or electronic cigarettes. If you need help quitting, ask your health care provider.  It is important to eat a healthy diet and maintain a healthy weight. ? Be sure to include plenty of vegetables, fruits, low-fat dairy products, and lean protein. ? Avoid eating foods that are high in solid fats, added sugars, or salt (sodium).  Get regular exercise. This is one of the most important things that you can do for your health. ? Try to exercise for at least 150 minutes each week. The type of exercise that you do should increase your heart rate and make you sweat. This is known as moderate-intensity exercise. ? Try to do strengthening exercises at least twice each week. Do these in addition to the moderate-intensity exercise.  Know your numbers.Ask your health care provider to check your cholesterol and your blood glucose. Continue to have your blood tested as directed by your health care provider.  What should I know about cancer screening? There are several types of cancer. Take the following steps to reduce your risk and to catch any cancer development as early as possible. Breast Cancer  Practice breast self-awareness. ? This means understanding how your breasts normally appear and feel. ? It also means doing regular breast self-exams. Let your health care provider know about any changes, no matter how small.  If you are 40  or older, have a clinician do a breast exam (clinical breast exam or CBE) every year. Depending on your age, family history, and medical history, it may be recommended that you also have a yearly breast X-ray (mammogram).  If you  have a family history of breast cancer, talk with your health care provider about genetic screening.  If you are at high risk for breast cancer, talk with your health care provider about having an MRI and a mammogram every year.  Breast cancer (BRCA) gene test is recommended for women who have family members with BRCA-related cancers. Results of the assessment will determine the need for genetic counseling and BRCA1 and for BRCA2 testing. BRCA-related cancers include these types: ? Breast. This occurs in males or females. ? Ovarian. ? Tubal. This may also be called fallopian tube cancer. ? Cancer of the abdominal or pelvic lining (peritoneal cancer). ? Prostate. ? Pancreatic.  Cervical, Uterine, and Ovarian Cancer Your health care provider may recommend that you be screened regularly for cancer of the pelvic organs. These include your ovaries, uterus, and vagina. This screening involves a pelvic exam, which includes checking for microscopic changes to the surface of your cervix (Pap test).  For women ages 21-65, health care providers may recommend a pelvic exam and a Pap test every three years. For women ages 79-65, they may recommend the Pap test and pelvic exam, combined with testing for human papilloma virus (HPV), every five years. Some types of HPV increase your risk of cervical cancer. Testing for HPV may also be done on women of any age who have unclear Pap test results.  Other health care providers may not recommend any screening for nonpregnant women who are considered low risk for pelvic cancer and have no symptoms. Ask your health care provider if a screening pelvic exam is right for you.  If you have had past treatment for cervical cancer or a condition that could lead to cancer, you need Pap tests and screening for cancer for at least 20 years after your treatment. If Pap tests have been discontinued for you, your risk factors (such as having a new sexual partner) need to be  reassessed to determine if you should start having screenings again. Some women have medical problems that increase the chance of getting cervical cancer. In these cases, your health care provider may recommend that you have screening and Pap tests more often.  If you have a family history of uterine cancer or ovarian cancer, talk with your health care provider about genetic screening.  If you have vaginal bleeding after reaching menopause, tell your health care provider.  There are currently no reliable tests available to screen for ovarian cancer.  Lung Cancer Lung cancer screening is recommended for adults 69-62 years old who are at high risk for lung cancer because of a history of smoking. A yearly low-dose CT scan of the lungs is recommended if you:  Currently smoke.  Have a history of at least 30 pack-years of smoking and you currently smoke or have quit within the past 15 years. A pack-year is smoking an average of one pack of cigarettes per day for one year.  Yearly screening should:  Continue until it has been 15 years since you quit.  Stop if you develop a health problem that would prevent you from having lung cancer treatment.  Colorectal Cancer  This type of cancer can be detected and can often be prevented.  Routine colorectal cancer screening usually begins at  age 42 and continues through age 45.  If you have risk factors for colon cancer, your health care provider may recommend that you be screened at an earlier age.  If you have a family history of colorectal cancer, talk with your health care provider about genetic screening.  Your health care provider may also recommend using home test kits to check for hidden blood in your stool.  A small camera at the end of a tube can be used to examine your colon directly (sigmoidoscopy or colonoscopy). This is done to check for the earliest forms of colorectal cancer.  Direct examination of the colon should be repeated every  5-10 years until age 71. However, if early forms of precancerous polyps or small growths are found or if you have a family history or genetic risk for colorectal cancer, you may need to be screened more often.  Skin Cancer  Check your skin from head to toe regularly.  Monitor any moles. Be sure to tell your health care provider: ? About any new moles or changes in moles, especially if there is a change in a mole's shape or color. ? If you have a mole that is larger than the size of a pencil eraser.  If any of your family members has a history of skin cancer, especially at a young age, talk with your health care provider about genetic screening.  Always use sunscreen. Apply sunscreen liberally and repeatedly throughout the day.  Whenever you are outside, protect yourself by wearing long sleeves, pants, a wide-brimmed hat, and sunglasses.  What should I know about osteoporosis? Osteoporosis is a condition in which bone destruction happens more quickly than new bone creation. After menopause, you may be at an increased risk for osteoporosis. To help prevent osteoporosis or the bone fractures that can happen because of osteoporosis, the following is recommended:  If you are 46-71 years old, get at least 1,000 mg of calcium and at least 600 mg of vitamin D per day.  If you are older than age 55 but younger than age 65, get at least 1,200 mg of calcium and at least 600 mg of vitamin D per day.  If you are older than age 54, get at least 1,200 mg of calcium and at least 800 mg of vitamin D per day.  Smoking and excessive alcohol intake increase the risk of osteoporosis. Eat foods that are rich in calcium and vitamin D, and do weight-bearing exercises several times each week as directed by your health care provider. What should I know about how menopause affects my mental health? Depression may occur at any age, but it is more common as you become older. Common symptoms of depression  include:  Low or sad mood.  Changes in sleep patterns.  Changes in appetite or eating patterns.  Feeling an overall lack of motivation or enjoyment of activities that you previously enjoyed.  Frequent crying spells.  Talk with your health care provider if you think that you are experiencing depression. What should I know about immunizations? It is important that you get and maintain your immunizations. These include:  Tetanus, diphtheria, and pertussis (Tdap) booster vaccine.  Influenza every year before the flu season begins.  Pneumonia vaccine.  Shingles vaccine.  Your health care provider may also recommend other immunizations. This information is not intended to replace advice given to you by your health care provider. Make sure you discuss any questions you have with your health care provider. Document Released: 09/06/2005  Document Revised: 02/02/2016 Document Reviewed: 04/18/2015 [employer] Interactive Patient Education  2018 [employer]  

## 2018-01-26 NOTE — Progress Notes (Signed)
Patient: [name] [name], Female    DOB: [birth year], 67 y.o.   MRN: [id number] Visit Date: 01/26/2018  Today's Provider: Mar Daring, PA-C   Chief Complaint  Patient presents with  . Medicare Wellness   Subjective:     Complete Physical [name] [name] is a 67 y.o. female. She feels well. She reports exercising walking, active with daily activites. She reports she is sleeping well.  01/23/18 AWE with NHA 01/22/17 CPE 05/06/17 Mammogram-BI-RADS1 05/06/17 BMD-Osteopenia -----------------------------------------------------------   Review of Systems  Constitutional: Negative.   HENT: Negative.   Eyes: Negative.   Respiratory: Negative.   Cardiovascular: Negative.   Gastrointestinal: Negative.   Endocrine: Negative.   Genitourinary: Negative.   Musculoskeletal: Negative.   Skin: Negative.   Allergic/Immunologic: Negative.   Neurological: Negative.   Hematological: Negative.   Psychiatric/Behavioral: Negative.     Social History   Socioeconomic History  . Marital status: Married    Spouse name: Remo Lipps  . Number of children: 3  . Years of education: 4 years college  . Highest education level: Bachelor's degree (e.g., BA, AB, BS)  Occupational History  . Occupation: retired Pharmacist, hospital  . Occupation: Press photographer  Social Needs  . Financial resource strain: Not hard at all  . Food insecurity:    Worry: Never true    Inability: Never true  . Transportation needs:    Medical: No    Non-medical: No  Tobacco Use  . Smoking status: Never Smoker  . Smokeless tobacco: Never Used  Substance and Sexual Activity  . Alcohol use: No  . Drug use: No  . Sexual activity: Never  Lifestyle  . Physical activity:    Days per week: 4 days    Minutes per session: 30 min  . Stress: Not at all  Relationships  . Social connections:    Talks on phone: More than three times a week    Gets together: More than three times a week    Attends religious service: Never   Active member of club or organization: No    Attends meetings of clubs or organizations: Never    Relationship status: Patient refused  . Intimate partner violence:    Fear of current or ex partner: Patient refused    Emotionally abused: Patient refused    Physically abused: Patient refused    Forced sexual activity: Patient refused  Other Topics Concern  . Not on file  Social History Narrative  . Not on file    Past Medical History:  Diagnosis Date  . Allergy   . Anxiety   . Arthritis   . Basal cell carcinoma (BCC) in situ of skin   . Chronic kidney disease   . Depression   . Diabetes mellitus without complication ([place])   . Genital warts    one time a long time ago  . Hyperlipidemia   . Hypertension   . Portal vein thrombosis   . VAIN I (vaginal intraepithelial neoplasia grade I)      Patient Active Problem List   Diagnosis Date Noted  . CKD (chronic kidney disease) stage 3, GFR 30-59 ml/min (HCC) 08/02/2015  . Arthritis 07/06/2015  . Baker's cyst of knee 07/06/2015  . Abnormal kidney function 04/04/2015  . Anxiety 04/04/2015  . Dermatitis, eczematoid 04/04/2015  . Diabetes mellitus, type 2 ([place]) 04/04/2015  . Abnormal liver enzymes 04/04/2015  . Hepatitis 04/04/2015  . Hypercholesteremia 04/04/2015  . Calculus of kidney 04/04/2015  . 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 Allergic rhinitis 04/04/2015  . Fatigue 04/04/2015  . 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 Dermatophytosis of groin 04/04/2015  . Urge incontinence 10/12/2012  . Renal colic 27/78/2423  . Ureteric stone 10/12/2012  . Avitaminosis D 09/29/2009  . Clinical depression 09/20/2009  . Essential (primary) hypertension 09/20/2009  . Carpal tunnel syndrome 01/17/2009  . Bursitis 01/16/2009    Past Surgical History:  Procedure Laterality Date  . ABDOMINAL HYSTERECTOMY  1986  . BLADDER SURGERY     at the same time of hysterectomy  . FOOT SURGERY Left 08/20/2012   Dr. Elvina Mattes  . [place]    Her family history includes Alcohol abuse in her father; Breast cancer (age of onset: 44) in her mother; Lymphoma in her son; Parkinsonism in her mother.      Current Outpatient Medications:  .  acetaminophen (TYLENOL) 500 MG tablet, Take 2 tablets (1,000 mg total) by mouth every 8 (eight) hours as needed., Disp: 1 tablet, Rfl: 0 .  aspirin EC 81 MG tablet, Take 1 tablet (81 mg total) by mouth daily., Disp: 1 tablet, Rfl: 0 .  Calcium-Magnesium-Vitamin D (CALCIUM 1200+D3 PO), Take 1 capsule by mouth daily. Vitamin d 3 is 1000 IU, Disp: , Rfl:  .  Cholecalciferol (VITAMIN D3) 2000 units TABS, Take 1 capsule by mouth daily., Disp: , Rfl:  .  ONE TOUCH ULTRA TEST test strip, CHECK BLOOD SUGAR ONCE DAILY, Disp: 50 each, Rfl: 6 .  sertraline (ZOLOFT) 100 MG tablet, TAKE ONE TABLET BY MOUTH EVERY DAY AT BEDTIME, Disp: 90 tablet, Rfl: 1  Patient Care Team: Mar Daring, PA-C as PCP - General (Family Medicine) Leata Mouse (Inactive) as Consulting Physician Arelia Sneddon, OD as Consulting Physician (Optometry)     Objective:   Vitals: BP 116/80 (BP Location: Left Arm, Patient Position: Sitting, Cuff Size: Normal)   Pulse 74   Temp 98.4 F (36.9 C) (Oral)   Resp 16   Ht 5\' 2"  (1.575 m)   Wt 181 lb (82.1 kg)   SpO2 96%   BMI 33.11 kg/m   Physical Exam  Constitutional: She is oriented to person, place, and time. She appears well-developed and well-nourished. No distress.  HENT:  Head: Normocephalic and atraumatic.  Right Ear: Hearing, tympanic membrane, external ear and ear canal normal.  Left Ear: Hearing, tympanic membrane, external ear and ear canal normal.  Nose: Nose normal.  Mouth/Throat: Uvula is midline, oropharynx is clear and moist and mucous membranes are normal. No oropharyngeal exudate.  Eyes: Pupils are equal, round, and reactive to light. Conjunctivae and EOM are normal. Right eye exhibits no discharge. Left eye exhibits no  discharge. No scleral icterus.  Neck: Normal range of motion. Neck supple. No JVD present. Carotid bruit is not present. No tracheal deviation present. No thyromegaly present.  Cardiovascular: Normal rate, regular rhythm, normal heart sounds and intact distal pulses. Exam reveals no gallop and no friction rub.  No murmur heard. Pulses:      Dorsalis pedis pulses are 2+ on the right side, and 2+ on the left side.       Posterior tibial pulses are 2+ on the right side, and 2+ on the left side.  Pulmonary/Chest: Effort normal and breath sounds normal. No respiratory distress. She has no wheezes. She has no rales. She exhibits no tenderness.  Abdominal: Soft. Bowel sounds are normal. She exhibits no distension and no mass. There is no  tenderness. There is no rebound and no guarding.  Musculoskeletal: Normal range of motion. She exhibits no edema or tenderness.       Right foot: There is normal range of motion and no deformity.       Left foot: There is normal range of motion and no deformity.  Feet:  Right Foot:  Protective Sensation: 10 sites tested. 10 sites sensed.  Skin Integrity: Negative for ulcer, blister, skin breakdown, erythema, warmth, callus or dry skin.  Left Foot:  Protective Sensation: 10 sites tested. 10 sites sensed.  Skin Integrity: Negative for ulcer, blister, skin breakdown, erythema, warmth, callus or dry skin.  Lymphadenopathy:    She has no cervical adenopathy.  Neurological: She is alert and oriented to person, place, and time.  Skin: Skin is warm and dry. No rash noted. She is not diaphoretic.  Psychiatric: She has a normal mood and affect. Her behavior is normal. Judgment and thought content normal.  Vitals reviewed.   Activities of Daily Living In your present state of health, do you have any difficulty performing the following activities: 01/23/2018  Hearing? N  Vision? N  Difficulty concentrating or making decisions? N  Walking or climbing stairs? N  Dressing  or bathing? N  Doing errands, shopping? N  Preparing Food and eating ? N  Using the Toilet? N  In the past six months, have you accidently leaked urine? Y  Comment Occasionally with pressure  Do you have problems with loss of bowel control? N  Managing your Medications? N  Managing your Finances? N  Housekeeping or managing your Housekeeping? N  Some recent data might be hidden    Fall Risk Assessment Fall Risk  01/23/2018 01/22/2017  Falls in the past year? No No     Depression Screen PHQ 2/9 Scores 01/23/2018 01/22/2017 09/30/2016  PHQ - 2 Score 0 0 0  PHQ- 9 Score - 0 0    Cognitive Testing - 6-CIT  Correct? Score   What year is it? yes 0 0 or 4  What month is it? yes 0 0 or 3  Memorize:    [name],  43,  [address],  [place],      What time is it? (within 1 hour) yes 0 0 or 3  Count backwards from 20 yes 0 0, 2, or 4  Name the months of the year yes 0 0, 2, or 4  Repeat name & address above yes 0 0, 2, 4, 6, 8, or 10       TOTAL SCORE  0/28   Interpretation:  Normal  Normal (0-7) Abnormal (8-28)       Assessment & Plan:    Annual Physical Reviewed patient's Family Medical History Reviewed and updated list of patient's medical providers Assessment of cognitive impairment was done Assessed patient's functional ability Established a written schedule for health screening [place] Completed and Reviewed  Exercise Activities and Dietary recommendations Goals    . DIET - REDUCE SUGAR INTAKE     Recommend monitoring intake of sugar and avoiding to help aid in weight loss.        Immunization History  Administered Date(s) Administered  . Td 12/14/2000  . Tdap 10/07/2012    Health Maintenance  Topic Date Due  . FOOT EXAM  09/20/1961  . COLONOSCOPY  09/20/2001  . URINE MICROALBUMIN  07/05/2016  . PNA vac Low Risk Adult (1 of 2 - PCV13) 09/20/2016  . HEMOGLOBIN A1C  07/30/2017  . OPHTHALMOLOGY EXAM  01/23/2018  . INFLUENZA VACCINE   02/26/2018  . MAMMOGRAM  05/07/2019  . TETANUS/TDAP  10/08/2022  . DEXA SCAN  Completed  . Hepatitis C Screening  Completed     Discussed health benefits of physical activity, and encouraged her to engage in regular exercise appropriate for her age and condition.    1. Annual physical exam Normal physical exam today. Will check labs as below and f/u pending lab results. If labs are stable and WNL she will not need to have these rechecked for one year at her next annual physical exam. She is to call the office in the meantime if she has any acute issue, questions or concerns.  2. Type 2 diabetes mellitus without complication, without long-term current use of insulin (HCC) UA microalbumin 50 now, up from 20 last year. Patient declines ACE/ARB. Diet controlled T2DM, patient declines treatment. Will check labs as below and f/u pending results. - POCT UA - Microalbumin - Hemoglobin A1c - TSH  3. Hypercholesteremia Diet controlled. Will check labs as below and f/u pending results. - Lipid Panel With LDL/HDL Ratio  4. Abnormal liver enzymes Diet controlled. Will check labs as below and f/u pending results.  5. Essential (primary) hypertension Stable. Will check labs as below and f/u pending results. - CBC with Differential/Platelet - Comprehensive metabolic panel - TSH  6. Pneumococcal vaccination declined Patient declines. States "I get whatever disease I am being vaccinated for when I get them."  ------------------------------------------------------------------------------------------------------------    Mar Daring, PA-C  [place]

## 2018-01-29 LAB — CBC WITH DIFFERENTIAL/PLATELET
Basophils Absolute: 0.1 10*3/uL (ref 0.0–0.2)
Basos: 1 %
EOS (ABSOLUTE): 0.4 10*3/uL (ref 0.0–0.4)
Eos: 6 %
Hematocrit: 44.7 % (ref 34.0–46.6)
Hemoglobin: 14.3 g/dL (ref 11.1–15.9)
IMMATURE GRANS (ABS): 0 10*3/uL (ref 0.0–0.1)
IMMATURE GRANULOCYTES: 0 %
LYMPHS: 37 %
Lymphocytes Absolute: 2.4 10*3/uL (ref 0.7–3.1)
MCH: 28.5 pg (ref 26.6–33.0)
MCHC: 32 g/dL (ref 31.5–35.7)
MCV: 89 fL (ref 79–97)
Monocytes Absolute: 0.4 10*3/uL (ref 0.1–0.9)
Monocytes: 6 %
NEUTROS PCT: 50 %
Neutrophils Absolute: 3.3 10*3/uL (ref 1.4–7.0)
PLATELETS: 240 10*3/uL (ref 150–450)
RBC: 5.02 x10E6/uL (ref 3.77–5.28)
RDW: 14.9 % (ref 12.3–15.4)
WBC: 6.6 10*3/uL (ref 3.4–10.8)

## 2018-01-29 LAB — COMPREHENSIVE METABOLIC PANEL
A/G RATIO: 1.8 (ref 1.2–2.2)
ALBUMIN: 4.4 g/dL (ref 3.6–4.8)
ALT: 24 IU/L (ref 0–32)
AST: 22 IU/L (ref 0–40)
Alkaline Phosphatase: 84 IU/L (ref 39–117)
BUN / CREAT RATIO: 12 (ref 12–28)
BUN: 15 mg/dL (ref 8–27)
Bilirubin Total: 0.5 mg/dL (ref 0.0–1.2)
CALCIUM: 9.2 mg/dL (ref 8.7–10.3)
CO2: 20 mmol/L (ref 20–29)
Chloride: 108 mmol/L — ABNORMAL HIGH (ref 96–106)
Creatinine, Ser: 1.27 mg/dL — ABNORMAL HIGH (ref 0.57–1.00)
GFR, EST AFRICAN AMERICAN: 51 mL/min/{1.73_m2} — AB (ref >59)
GFR, EST NON AFRICAN AMERICAN: 44 mL/min/{1.73_m2} — AB (ref >59)
GLOBULIN, TOTAL: 2.5 g/dL (ref 1.5–4.5)
Glucose: 130 mg/dL — ABNORMAL HIGH (ref 65–99)
POTASSIUM: 4.6 mmol/L (ref 3.5–5.2)
SODIUM: 144 mmol/L (ref 134–144)
TOTAL PROTEIN: 6.9 g/dL (ref 6.0–8.5)

## 2018-01-29 LAB — LIPID PANEL WITH LDL/HDL RATIO
Cholesterol, Total: 239 mg/dL — ABNORMAL HIGH (ref 100–199)
HDL: 44 mg/dL (ref >39)
LDL CALC: 162 mg/dL — AB (ref 0–99)
LDl/HDL Ratio: 3.7 ratio — ABNORMAL HIGH (ref 0.0–3.2)
Triglycerides: 164 mg/dL — ABNORMAL HIGH (ref 0–149)
VLDL CHOLESTEROL CAL: 33 mg/dL (ref 5–40)

## 2018-01-29 LAB — TSH: TSH: 3.21 u[IU]/mL (ref 0.450–4.500)

## 2018-01-29 LAB — HEMOGLOBIN A1C
Est. average glucose Bld gHb Est-mCnc: 143 mg/dL
Hgb A1c MFr Bld: 6.6 % — ABNORMAL HIGH (ref 4.8–5.6)

## 2018-01-30 ENCOUNTER — Telehealth: Payer: Self-pay

## 2018-01-30 NOTE — Telephone Encounter (Signed)
-----   Message from Mar Daring, [place] sent at 01/30/2018  3:26 PM EDT ----- Blood count is normal. Kidney function is stable from last year. Liver function normal. A1c down to 6.6 from 6.9. Cholesterol is still elevated but improved from last year. Thyroid is normal. Continue with healthy lifestyle modifications as you have been doing.

## 2018-01-30 NOTE — Telephone Encounter (Signed)
Patient advised as directed below.  Thanks,  -Sharay Bellissimo 

## 2018-02-15 ENCOUNTER — Other Ambulatory Visit: Payer: Self-pay | Admitting: Physician Assistant

## 2018-02-15 DIAGNOSIS — F32A Depression, unspecified: Secondary | ICD-10-CM

## 2018-02-15 DIAGNOSIS — F329 Major depressive disorder, single episode, unspecified: Secondary | ICD-10-CM

## 2018-03-07 ENCOUNTER — Other Ambulatory Visit: Payer: Self-pay | Admitting: Physician Assistant

## 2018-03-07 DIAGNOSIS — E119 Type 2 diabetes mellitus without complications: Secondary | ICD-10-CM

## 2018-03-31 LAB — HM DIABETES EYE EXAM

## 2018-04-23 ENCOUNTER — Other Ambulatory Visit: Payer: Self-pay | Admitting: Physician Assistant

## 2018-04-23 DIAGNOSIS — Z1211 Encounter for screening for malignant neoplasm of colon: Secondary | ICD-10-CM

## 2018-04-23 NOTE — Progress Notes (Signed)
cologuard ordered for patient.  

## 2018-05-11 ENCOUNTER — Telehealth: Payer: Self-pay

## 2018-05-11 LAB — COLOGUARD: Cologuard: NEGATIVE

## 2018-05-11 NOTE — Telephone Encounter (Signed)
LMTCB-  Received Cologuard Results;  Results: Negative  Repeat: in 3 years.  Thanks,  -Joseline

## 2018-05-11 NOTE — Telephone Encounter (Signed)
Patient advised as directed below.  Thanks,  -Honey Zakarian 

## 2018-08-16 ENCOUNTER — Other Ambulatory Visit: Payer: Self-pay | Admitting: Physician Assistant

## 2018-08-16 DIAGNOSIS — F32A Depression, unspecified: Secondary | ICD-10-CM

## 2018-08-16 DIAGNOSIS — F329 Major depressive disorder, single episode, unspecified: Secondary | ICD-10-CM

## 2018-08-25 ENCOUNTER — Other Ambulatory Visit: Payer: Self-pay | Admitting: Physician Assistant

## 2018-08-25 NOTE — Telephone Encounter (Signed)
Patient advised rx was sent to pharmacy 08/17/18.

## 2018-08-25 NOTE — Telephone Encounter (Signed)
Pt is out of Rx.  Needing a refill on:  sertraline (ZOLOFT) 100 MG tablet  Please fill at: [id number] [address], [place] (Phone) [phone] (Fax)   Thanks, [facility]

## 2019-01-21 ENCOUNTER — Telehealth: Payer: Self-pay | Admitting: Physician Assistant

## 2019-01-21 NOTE — Telephone Encounter (Signed)
Pt called to find out for sure if her appt is a telephone visit on Tues. With McKenzie.  She said she will wait for the call starting at 8:30 am on Tues. Only call her back if she does not have a telephone visit.  Thanks, [employer]

## 2019-01-26 ENCOUNTER — Other Ambulatory Visit: Payer: Self-pay

## 2019-01-26 ENCOUNTER — Ambulatory Visit (INDEPENDENT_AMBULATORY_CARE_PROVIDER_SITE_OTHER): Payer: Medicare Other

## 2019-01-26 DIAGNOSIS — Z0000 Encounter for general adult medical examination without abnormal findings: Secondary | ICD-10-CM | POA: Diagnosis not present

## 2019-01-26 NOTE — Patient Instructions (Signed)
[name] [name] , Thank you for taking time to come for your Medicare Wellness Visit. I appreciate your ongoing commitment to your health goals. Please review the following plan we discussed and let me know if I can assist you in the future.   Screening recommendations/referrals: Colonoscopy: Cologuard up to date, due 05/05/21 Mammogram: Up to date, due 04/2019 Bone Density: Up to date, due 04/2027 Recommended yearly ophthalmology/optometry visit for glaucoma screening and checkup Recommended yearly dental visit for hygiene and checkup  Vaccinations: Influenza vaccine: Due fall 2020 Pneumococcal vaccine: Pt declines today.  Tdap vaccine: Up to date, due 09/2022 Shingles vaccine: Pt declines today.     Advanced directives: Advance directive discussed with you today. Even though you declined this today please call our office should you change your mind and we can give you the proper paperwork for you to fill out.  Conditions/risks identified: Continue to cut back and monitor sugar intake in daily diet to help aid with weight loss and diabetes control.   Next appointment: 03/24/19 @ 10:00 AM with Fenton Malling.    Preventive Care 59 Years and Older, Female Preventive care refers to lifestyle choices and visits with your health care provider that can promote health and wellness. What does preventive care include?  A yearly physical exam. This is also called an annual well check.  Dental exams once or twice a year.  Routine eye exams. Ask your health care provider how often you should have your eyes checked.  Personal lifestyle choices, including:  Daily care of your teeth and gums.  Regular physical activity.  Eating a healthy diet.  Avoiding tobacco and drug use.  Limiting alcohol use.  Practicing safe sex.  Taking low-dose aspirin every day.  Taking vitamin and mineral supplements as recommended by your health care provider. What happens during an annual well check?  The services and screenings done by your health care provider during your annual well check will depend on your age, overall health, lifestyle risk factors, and family history of disease. Counseling  Your health care provider may ask you questions about your:  Alcohol use.  Tobacco use.  Drug use.  Emotional well-being.  Home and relationship well-being.  Sexual activity.  Eating habits.  History of falls.  Memory and ability to understand (cognition).  Work and work Statistician.  Reproductive health. Screening  You may have the following tests or measurements:  Height, weight, and BMI.  Blood pressure.  Lipid and cholesterol levels. These may be checked every 5 years, or more frequently if you are over 14 years old.  Skin check.  Lung cancer screening. You may have this screening every year starting at age 25 if you have a 30-pack-year history of smoking and currently smoke or have quit within the past 15 years.  Fecal occult blood test (FOBT) of the stool. You may have this test every year starting at age 53.  Flexible sigmoidoscopy or colonoscopy. You may have a sigmoidoscopy every 5 years or a colonoscopy every 10 years starting at age 74.  Hepatitis C blood test.  Hepatitis B blood test.  Sexually transmitted disease (STD) testing.  Diabetes screening. This is done by checking your blood sugar (glucose) after you have not eaten for a while (fasting). You may have this done every 1-3 years.  Bone density scan. This is done to screen for osteoporosis. You may have this done starting at age 75.  Mammogram. This may be done every 1-2 years. Talk to your health  care provider about how often you should have regular mammograms. Talk with your health care provider about your test results, treatment options, and if necessary, the need for more tests. Vaccines  Your health care provider may recommend certain vaccines, such as:  Influenza vaccine. This is  recommended every year.  Tetanus, diphtheria, and acellular pertussis (Tdap, Td) vaccine. You may need a Td booster every 10 years.  Zoster vaccine. You may need this after age 22.  Pneumococcal 13-valent conjugate (PCV13) vaccine. One dose is recommended after age 10.  Pneumococcal polysaccharide (PPSV23) vaccine. One dose is recommended after age 13. Talk to your health care provider about which screenings and vaccines you need and how often you need them. This information is not intended to replace advice given to you by your health care provider. Make sure you discuss any questions you have with your health care provider. Document Released: 08/11/2015 Document Revised: 04/03/2016 Document Reviewed: 05/16/2015 Elsevier Interactive Patient Education  2017 [place] Prevention in the Home Falls can cause injuries. They can happen to people of all ages. There are many things you can do to make your home safe and to help prevent falls. What can I do on the outside of my home?  Regularly fix the edges of walkways and driveways and fix any cracks.  Remove anything that might make you trip as you walk through a door, such as a raised step or threshold.  Trim any bushes or trees on the path to your home.  Use bright outdoor lighting.  Clear any walking paths of anything that might make someone trip, such as rocks or tools.  Regularly check to see if handrails are loose or broken. Make sure that both sides of any steps have handrails.  Any raised decks and porches should have guardrails on the edges.  Have any leaves, snow, or ice cleared regularly.  Use sand or salt on walking paths during winter.  Clean up any spills in your garage right away. This includes oil or grease spills. What can I do in the bathroom?  Use night lights.  Install grab bars by the toilet and in the tub and shower. Do not use towel bars as grab bars.  Use non-skid mats or decals in the tub or  shower.  If you need to sit down in the shower, use a plastic, non-slip stool.  Keep the floor dry. Clean up any water that spills on the floor as soon as it happens.  Remove soap buildup in the tub or shower regularly.  Attach bath mats securely with double-sided non-slip rug tape.  Do not have throw rugs and other things on the floor that can make you trip. What can I do in the bedroom?  Use night lights.  Make sure that you have a light by your bed that is easy to reach.  Do not use any sheets or blankets that are too big for your bed. They should not hang down onto the floor.  Have a firm chair that has side arms. You can use this for support while you get dressed.  Do not have throw rugs and other things on the floor that can make you trip. What can I do in the kitchen?  Clean up any spills right away.  Avoid walking on wet floors.  Keep items that you use a lot in easy-to-reach places.  If you need to reach something above you, use a strong step stool that has a grab  bar.  Keep electrical cords out of the way.  Do not use floor polish or wax that makes floors slippery. If you must use wax, use non-skid floor wax.  Do not have throw rugs and other things on the floor that can make you trip. What can I do with my stairs?  Do not leave any items on the stairs.  Make sure that there are handrails on both sides of the stairs and use them. Fix handrails that are broken or loose. Make sure that handrails are as long as the stairways.  Check any carpeting to make sure that it is firmly attached to the stairs. Fix any carpet that is loose or worn.  Avoid having throw rugs at the top or bottom of the stairs. If you do have throw rugs, attach them to the floor with carpet tape.  Make sure that you have a light switch at the top of the stairs and the bottom of the stairs. If you do not have them, ask someone to add them for you. What else can I do to help prevent falls?   Wear shoes that:  Do not have high heels.  Have rubber bottoms.  Are comfortable and fit you well.  Are closed at the toe. Do not wear sandals.  If you use a stepladder:  Make sure that it is fully opened. Do not climb a closed stepladder.  Make sure that both sides of the stepladder are locked into place.  Ask someone to hold it for you, if possible.  Clearly mark and make sure that you can see:  Any grab bars or handrails.  First and last steps.  Where the edge of each step is.  Use tools that help you move around (mobility aids) if they are needed. These include:  Canes.  Walkers.  Scooters.  Crutches.  Turn on the lights when you go into a dark area. Replace any light bulbs as soon as they burn out.  Set up your furniture so you have a clear path. Avoid moving your furniture around.  If any of your floors are uneven, fix them.  If there are any pets around you, be aware of where they are.  Review your medicines with your doctor. Some medicines can make you feel dizzy. This can increase your chance of falling. Ask your doctor what other things that you can do to help prevent falls. This information is not intended to replace advice given to you by your health care provider. Make sure you discuss any questions you have with your health care provider. Document Released: 05/11/2009 Document Revised: 12/21/2015 Document Reviewed: 08/19/2014 Elsevier Interactive Patient Education  2017 [employer].

## 2019-01-26 NOTE — Progress Notes (Signed)
Subjective:   [name] [name] is a 68 y.o. female who presents for Medicare Annual (Subsequent) preventive examination.    This visit is being conducted through telemedicine due to the COVID-19 pandemic. This patient has given me verbal consent via doximity to conduct this visit, patient states they are participating from their home address. Some vital signs may be absent or patient reported.    Patient identification: identified by name, DOB, and current address  Review of Systems:  N/A  Cardiac Risk Factors include: advanced age (>60men, >58 women);diabetes mellitus;hypertension;dyslipidemia     Objective:     Vitals: There were no vitals taken for this visit.  There is no height or weight on file to calculate BMI. Unable to obtain vitals due to visit being conducted via telephonically.   Advanced Directives 01/26/2019 01/23/2018 07/02/2017 01/22/2017 07/06/2015 04/05/2015  Does Patient Have a Medical Advance Directive? No No No No No No  Does patient want to make changes to medical advance directive? - Yes (MAU/Ambulatory/Procedural Areas - Information given) - - - -  Would patient like information on creating a medical advance directive? No - Patient declined - Yes (MAU/Ambulatory/Procedural Areas - Information given) - - -    Tobacco Social History   Tobacco Use  Smoking Status Never Smoker  Smokeless Tobacco Never Used     Counseling given: Not Answered   Clinical Intake:  Pre-visit preparation completed: Yes  Pain : No/denies pain Pain Score: 0-No pain     Nutritional Risks: None Diabetes: Yes CBG done?: No Did pt. bring in CBG monitor from home?: No  How often do you need to have someone help you when you read instructions, pamphlets, or other written materials from your doctor or pharmacy?: 1 - Never   Diabetes:  Is the patient diabetic?  Yes type 2 If diabetic, was a CBG obtained today?  No  Did the patient bring in their glucometer from home?  No  How  often do you monitor your CBG's? Occasionally depending on how she feels.   Financial Strains and Diabetes Management:  Are you having any financial strains with the device, your supplies or your medication? No .  Does the patient want to be seen by Chronic Care Management for management of their diabetes?  No  Would the patient like to be referred to a Nutritionist or for Diabetic Management?  No   Diabetic Exams:  Diabetic Eye Exam: Completed 03/31/18. Repeat yearly.   Diabetic Foot Exam: Currently due. Note made to follow up on this at next in office visit.     Interpreter Needed?: No  Information entered by :: [facility], LPN  Past Medical History:  Diagnosis Date  . Allergy   . Anxiety   . Arthritis   . Basal cell carcinoma    in vaginal area  . Basal cell carcinoma (BCC) in situ of skin   . Chronic kidney disease   . Depression   . Diabetes mellitus without complication ([place])   . Genital warts    one time a long time ago  . Hyperlipidemia   . Hypertension   . Portal vein thrombosis   . VAIN I (vaginal intraepithelial neoplasia grade I)    Past Surgical History:  Procedure Laterality Date  . ABDOMINAL HYSTERECTOMY  1986  . BLADDER SURGERY     at the same time of hysterectomy  . FOOT SURGERY Left 08/20/2012   Dr. Elvina Mattes  . [place]  History  Problem Relation Age of Onset  . Parkinsonism Mother   . Breast cancer Mother 60  . Alcohol abuse Father   . Lymphoma Son    Social History   Socioeconomic History  . Marital status: Married    Spouse name: Remo Lipps  . Number of children: 3  . Years of education: 4 years college  . Highest education level: Bachelor's degree (e.g., BA, AB, BS)  Occupational History  . Occupation: retired Pharmacist, hospital  . Occupation: Press photographer  Social Needs  . Financial resource strain: Not hard at all  . Food insecurity    Worry: Never true    Inability: Never true  . Transportation needs    Medical: No     Non-medical: No  Tobacco Use  . Smoking status: Never Smoker  . Smokeless tobacco: Never Used  Substance and Sexual Activity  . Alcohol use: No  . Drug use: No  . Sexual activity: Never  Lifestyle  . Physical activity    Days per week: 0 days    Minutes per session: 0 min  . Stress: Only a little  Relationships  . Social Herbalist on phone: Patient refused    Gets together: Patient refused    Attends religious service: Patient refused    Active member of club or organization: Patient refused    Attends meetings of clubs or organizations: Patient refused    Relationship status: Patient refused  Other Topics Concern  . Not on file  Social History Narrative  . Not on file    Outpatient Encounter Medications as of 01/26/2019  Medication Sig  . acetaminophen (TYLENOL) 500 MG tablet Take 2 tablets (1,000 mg total) by mouth every 8 (eight) hours as needed.  Marland Kitchen aspirin EC 81 MG tablet Take 1 tablet (81 mg total) by mouth daily.  . Calcium-Magnesium-Vitamin D (CALCIUM 1200+D3 PO) Take 1 capsule by mouth daily. Vitamin d 3 is 1000 IU  . Cholecalciferol (VITAMIN D3) 2000 units TABS Take 1 capsule by mouth daily.  . ONE TOUCH ULTRA TEST test strip CHECK BLOOD SUGAR ONCE DAILY  . sertraline (ZOLOFT) 100 MG tablet TAKE ONE TABLET BY MOUTH EVERY DAY AT BEDTIME   No facility-administered encounter medications on file as of 01/26/2019.     Activities of Daily Living In your present state of health, do you have any difficulty performing the following activities: 01/26/2019  Hearing? N  Vision? N  Difficulty concentrating or making decisions? N  Walking or climbing stairs? N  Dressing or bathing? N  Doing errands, shopping? N  Preparing Food and eating ? N  Using the Toilet? N  In the past six months, have you accidently leaked urine? Y  Comment Occasionally- wears protection daily.  Do you have problems with loss of bowel control? N  Managing your Medications? N   Managing your Finances? N  Housekeeping or managing your Housekeeping? N  Some recent data might be hidden    Patient Care Team: Mar Daring, PA-C as PCP - General (Family Medicine) Leata Mouse (Inactive) as Consulting Physician Arelia Sneddon, [place] as Consulting Physician (Optometry) Merril Abbe, MD as Referring Physician (Dermatology)    Assessment:   This is a routine wellness examination for Carin.  Exercise Activities and Dietary recommendations Current Exercise Habits: The patient does not participate in regular exercise at present, Exercise limited by: None identified  Goals    . DIET - REDUCE SUGAR INTAKE     Recommend  monitoring intake of sugar and avoiding to help aid in weight loss.        Fall Risk: Fall Risk  01/26/2019 01/23/2018 01/22/2017  Falls in the past year? 1 No No  Number falls in past yr: 1 - -  Injury with Fall? 0 - -  Follow up Falls prevention discussed - -    FALL RISK PREVENTION PERTAINING TO THE HOME:  Any stairs in or around the home? Yes  If so, are there any without handrails? No   Home free of loose throw rugs in walkways, pet beds, electrical cords, etc? Yes  Adequate lighting in your home to reduce risk of falls? Yes   ASSISTIVE DEVICES UTILIZED TO PREVENT FALLS:  Life alert? No  Use of a cane, walker or w/c? No  Grab bars in the bathroom? Yes  Shower chair or bench in shower? No  Elevated toilet seat or a handicapped toilet? Yes   TIMED UP AND GO:  Was the test performed? No .    Depression Screen PHQ 2/9 Scores 01/26/2019 01/23/2018 01/22/2017 09/30/2016  PHQ - 2 Score 0 0 0 0  PHQ- 9 Score - - 0 0     Cognitive Function     6CIT Screen 01/26/2019 01/23/2018  What Year? 0 points 0 points  What month? 0 points 0 points  What time? 0 points 0 points  Count back from 20 0 points 0 points  Months in reverse 0 points 0 points  Repeat phrase 0 points 0 points  Total Score 0 0    Immunization  History  Administered Date(s) Administered  . Td 12/14/2000  . Tdap 10/07/2012    Qualifies for Shingles Vaccine? Yes . Due for Shingrix. Education has been provided regarding the importance of this vaccine. Pt has been advised to call insurance company to determine out of pocket expense. Advised may also receive vaccine at local pharmacy or Health Dept. Verbalized acceptance and understanding.  Tdap: Up to date  Flu Vaccine: Due fall 2020  Pneumococcal Vaccine: Due for Pneumococcal vaccine. Does the patient want to receive this vaccine today?  No . Education has been provided regarding the importance of this vaccine but still declined. Advised may receive this vaccine at local pharmacy or Health Dept. Aware to provide a copy of the vaccination record if obtained from local pharmacy or Health Dept. Verbalized acceptance and understanding.   Screening Tests Health Maintenance  Topic Date Due  . FOOT EXAM  09/20/1960  . HEMOGLOBIN A1C  07/31/2018  . URINE MICROALBUMIN  01/27/2019  . PNA vac Low Risk Adult (1 of 2 - PCV13) 01/27/2019 (Originally 09/20/2016)  . INFLUENZA VACCINE  02/27/2019  . OPHTHALMOLOGY EXAM  04/01/2019  . MAMMOGRAM  05/07/2019  . Fecal DNA (Cologuard)  05/05/2021  . DEXA SCAN  05/06/2022  . TETANUS/TDAP  10/08/2022  . Hepatitis C Screening  Completed    Cancer Screenings:  Colorectal Screening: Cologuard completed 05/05/18. Repeat every 3 years.  Mammogram: Completed 05/06/17.   Bone Density: Completed 05/06/17. Results reflect OSTEOPENIA. Repeat every 5 years. .  Lung Cancer Screening: (Low Dose CT Chest recommended if Age 18-80 years, 30 pack-year currently smoking OR have quit w/in 15years.) does not qualify.   Additional Screening:  Hepatitis C Screening: Up to date  Dental Screening: Recommended annual dental exams for proper oral hygiene   Community Resource Referral:  CRR required this visit?  No       Plan:  I have personally reviewed  and  addressed the Medicare Annual Wellness questionnaire and have noted the following in the patient's chart:  A. Medical and social history B. Use of alcohol, tobacco or illicit drugs  C. Current medications and supplements D. Functional ability and status E.  Nutritional status F.  Physical activity G. Advance directives H. List of other physicians I.  Hospitalizations, surgeries, and ER visits in previous 12 months J.  [place] such as hearing and vision if needed, cognitive and depression L. Referrals and appointments   In addition, I have reviewed and discussed with patient certain preventive protocols, quality metrics, and best practice recommendations. A written personalized care plan for preventive services as well as general preventive health recommendations were provided to patient.   Glendora Score, [place]  02/13/5973 Nurse Health Advisor   Nurse Notes: Pt needs a diabetic foot exam, Hgb A1c check and urine check at next OV. Declined the Pneumonia vaccines.

## 2019-02-18 ENCOUNTER — Other Ambulatory Visit: Payer: Self-pay | Admitting: Physician Assistant

## 2019-02-18 DIAGNOSIS — F32A Depression, unspecified: Secondary | ICD-10-CM

## 2019-02-18 DIAGNOSIS — F329 Major depressive disorder, single episode, unspecified: Secondary | ICD-10-CM

## 2019-03-18 ENCOUNTER — Telehealth: Payer: Self-pay | Admitting: Physician Assistant

## 2019-03-18 NOTE — Telephone Encounter (Signed)
Lab result received from [facility] for A1c. It is stable at 6.6 when compared to last years.   Will be scanned in chart.

## 2019-03-18 NOTE — Telephone Encounter (Signed)
Patient advised as below.  

## 2019-03-24 ENCOUNTER — Ambulatory Visit (INDEPENDENT_AMBULATORY_CARE_PROVIDER_SITE_OTHER): Payer: Medicare Other | Admitting: Physician Assistant

## 2019-03-24 ENCOUNTER — Encounter: Payer: Self-pay | Admitting: Physician Assistant

## 2019-03-24 ENCOUNTER — Other Ambulatory Visit: Payer: Self-pay

## 2019-03-24 VITALS — BP 122/74 | HR 77 | Temp 97.8°F | Resp 16 | Wt 176.0 lb

## 2019-03-24 DIAGNOSIS — Z0000 Encounter for general adult medical examination without abnormal findings: Secondary | ICD-10-CM | POA: Diagnosis not present

## 2019-03-24 DIAGNOSIS — I10 Essential (primary) hypertension: Secondary | ICD-10-CM | POA: Diagnosis not present

## 2019-03-24 DIAGNOSIS — Z1239 Encounter for other screening for malignant neoplasm of breast: Secondary | ICD-10-CM

## 2019-03-24 DIAGNOSIS — E7800 Pure hypercholesterolemia, unspecified: Secondary | ICD-10-CM

## 2019-03-24 DIAGNOSIS — N183 Chronic kidney disease, stage 3 unspecified: Secondary | ICD-10-CM

## 2019-03-24 DIAGNOSIS — E1122 Type 2 diabetes mellitus with diabetic chronic kidney disease: Secondary | ICD-10-CM | POA: Diagnosis not present

## 2019-03-24 DIAGNOSIS — R748 Abnormal levels of other serum enzymes: Secondary | ICD-10-CM

## 2019-03-24 NOTE — Patient Instructions (Signed)
Health Maintenance After Age 67 After age 67, you are at a higher risk for certain long-term diseases and infections as well as injuries from falls. Falls are a major cause of broken bones and head injuries in people who are older than age 67. Getting regular preventive care can help to keep you healthy and well. Preventive care includes getting regular testing and making lifestyle changes as recommended by your health care provider. Talk with your health care provider about:  Which screenings and tests you should have. A screening is a test that checks for a disease when you have no symptoms.  A diet and exercise plan that is right for you. What should I know about screenings and tests to prevent falls? Screening and testing are the best ways to find a health problem early. Early diagnosis and treatment give you the best chance of managing medical conditions that are common after age 67. Certain conditions and lifestyle choices may make you more likely to have a fall. Your health care provider may recommend:  Regular vision checks. Poor vision and conditions such as cataracts can make you more likely to have a fall. If you wear glasses, make sure to get your prescription updated if your vision changes.  Medicine review. Work with your health care provider to regularly review all of the medicines you are taking, including over-the-counter medicines. Ask your health care provider about any side effects that may make you more likely to have a fall. Tell your health care provider if any medicines that you take make you feel dizzy or sleepy.  Osteoporosis screening. Osteoporosis is a condition that causes the bones to get weaker. This can make the bones weak and cause them to break more easily.  Blood pressure screening. Blood pressure changes and medicines to control blood pressure can make you feel dizzy.  Strength and balance checks. Your health care provider may recommend certain tests to check your  strength and balance while standing, walking, or changing positions.  Foot health exam. Foot pain and numbness, as well as not wearing proper footwear, can make you more likely to have a fall.  Depression screening. You may be more likely to have a fall if you have a fear of falling, feel emotionally low, or feel unable to do activities that you used to do.  Alcohol use screening. Using too much alcohol can affect your balance and may make you more likely to have a fall. What actions can I take to lower my risk of falls? General instructions  Talk with your health care provider about your risks for falling. Tell your health care provider if: ? You fall. Be sure to tell your health care provider about all falls, even ones that seem minor. ? You feel dizzy, sleepy, or off-balance.  Take over-the-counter and prescription medicines only as told by your health care provider. These include any supplements.  Eat a healthy diet and maintain a healthy weight. A healthy diet includes low-fat dairy products, low-fat (lean) meats, and fiber from whole grains, beans, and lots of fruits and vegetables. Home safety  Remove any tripping hazards, such as rugs, cords, and clutter.  Install safety equipment such as grab bars in bathrooms and safety rails on stairs.  Keep rooms and walkways well-lit. Activity   Follow a regular exercise program to stay fit. This will help you maintain your balance. Ask your health care provider what types of exercise are appropriate for you.  If you need a cane or   walker, use it as recommended by your health care provider.  Wear supportive shoes that have nonskid soles. Lifestyle  Do not drink alcohol if your health care provider tells you not to drink.  If you drink alcohol, limit how much you have: ? 0-1 drink a day for women. ? 0-2 drinks a day for men.  Be aware of how much alcohol is in your drink. In the [place] one drink equals one typical bottle of beer (12  oz), one-half glass of wine (5 oz), or one shot of hard liquor (1 oz).  Do not use any products that contain nicotine or tobacco, such as cigarettes and e-cigarettes. If you need help quitting, ask your health care provider. Summary  Having a healthy lifestyle and getting preventive care can help to protect your health and wellness after age 67.  Screening and testing are the best way to find a health problem early and help you avoid having a fall. Early diagnosis and treatment give you the best chance for managing medical conditions that are more common for people who are older than age 67.  Falls are a major cause of broken bones and head injuries in people who are older than age 67. Take precautions to prevent a fall at home.  Work with your health care provider to learn what changes you can make to improve your health and wellness and to prevent falls. This information is not intended to replace advice given to you by your health care provider. Make sure you discuss any questions you have with your health care provider. Document Released: 05/28/2017 Document Revised: 11/05/2018 Document Reviewed: 05/28/2017 Elsevier Patient Education  2020 [employer]  

## 2019-03-24 NOTE — Progress Notes (Signed)
Patient: [name] [name], Female    DOB: 1951-02-23, 68 y.o.   MRN: [id number] Visit Date: 03/24/2019  Today's Provider: Mar Daring, PA-C   Chief Complaint  Patient presents with   Annual Exam   Subjective:     Annual physical exam [name] [name] is a 68 y.o. female who presents today for health maintenance and complete physical. She feels well. She reports exercising by working in the yard and garden. She reports she is sleeping fairly well. -----------------------------------------------------------------   Review of Systems  Constitutional: Negative.   HENT: Negative.   Eyes: Negative.   Respiratory: Negative.   Cardiovascular: Negative.   Gastrointestinal: Negative.   Endocrine: Negative.   Genitourinary: Negative.   Musculoskeletal: Negative.   Skin: Negative.   Allergic/Immunologic: Negative.   Neurological: Negative.   Hematological: Negative.   Psychiatric/Behavioral: Negative.     Social History      She  reports that she has never smoked. She has never used smokeless tobacco. She reports that she does not drink alcohol or use drugs.       Social History   Socioeconomic History   Marital status: Married    Spouse name: Remo Lipps   Number of children: 3   Years of education: 4 years college   Highest education level: Bachelor's degree (e.g., BA, AB, BS)  Occupational History   Occupation: retired Pharmacist, hospital   Occupation: Archivist strain: Not hard at all   Food insecurity    Worry: Never true    Inability: Never true   Transportation needs    Medical: No    Non-medical: No  Tobacco Use   Smoking status: Never Smoker   Smokeless tobacco: Never Used  Substance and Sexual Activity   Alcohol use: No   Drug use: No   Sexual activity: Never  Lifestyle   Physical activity    Days per week: 0 days    Minutes per session: 0 min   Stress: Only a little  Relationships   Social  connections    Talks on phone: Patient refused    Gets together: Patient refused    Attends religious service: Patient refused    Active member of club or organization: Patient refused    Attends meetings of clubs or organizations: Patient refused    Relationship status: Patient refused  Other Topics Concern   Not on file  Social History Narrative   Not on file    Past Medical History:  Diagnosis Date   Allergy    Anxiety    Arthritis    Basal cell carcinoma    in vaginal area   Basal cell carcinoma (BCC) in situ of skin    Chronic kidney disease    Depression    Diabetes mellitus without complication ([place])    Genital warts    one time a long time ago   Hyperlipidemia    Hypertension    Portal vein thrombosis    VAIN I (vaginal intraepithelial neoplasia grade I)      Patient Active Problem List   Diagnosis Date Noted   CKD (chronic kidney disease) stage 3, GFR 30-59 ml/min ([place]) 08/02/2015   Arthritis 07/06/2015   Baker's cyst of knee 07/06/2015   Abnormal kidney function 04/04/2015   Anxiety 04/04/2015   Dermatitis, eczematoid 04/04/2015   Diabetes mellitus, type 2 ([place]) 04/04/2015   Abnormal liver enzymes 04/04/2015   Hepatitis 04/04/2015   Hypercholesteremia  04/04/2015   Calculus of kidney 04/04/2015   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 Elvina Mattes   WISDOM TOOTH  EXTRACTION  1986    Family History        Family Status  Relation Name Status   Mother  Alive   Father  Deceased at age 6       MI   Brother  26   Son  (Not Specified)        Her family history includes Alcohol abuse in her father; Breast cancer (age of onset: 22) in her mother; Lymphoma in her son; Parkinsonism in her mother.      Allergies  Allergen Reactions   Atorvastatin Other (See Comments)    Muscle spasms   Lovastatin     muscle aches   Statins Other (See Comments)   Wheat Bran Swelling   Zetia  [Ezetimibe]     muscle aches   Metronidazole Nausea And Vomiting and Rash     Current Outpatient Medications:    acetaminophen (TYLENOL) 500 MG tablet, Take 2 tablets (1,000 mg total) by mouth every 8 (eight) hours as needed., Disp: 1 tablet, Rfl: 0   aspirin EC 81 MG tablet, Take 1 tablet (81 mg total) by mouth daily., Disp: 1 tablet, Rfl: 0   Calcium-Magnesium-Vitamin D (CALCIUM 1200+D3 PO), Take 1 capsule by mouth daily. Vitamin d 3 is 1000 IU, Disp: , Rfl:    Cholecalciferol (VITAMIN D3) 2000 units TABS, Take 1 capsule by mouth daily., Disp: , Rfl:    ONE TOUCH ULTRA TEST test strip, CHECK BLOOD SUGAR ONCE DAILY, Disp: 100 each, Rfl: 6   sertraline (ZOLOFT) 100 MG tablet, TAKE 1 TABLET BY MOUTH EVERYDAY AT BEDTIME, Disp: 90 tablet, Rfl: 1   Patient Care Team: Mar Daring, PA-C as PCP - General (Family Medicine) Leata Mouse (Inactive) as Consulting Physician Arelia Sneddon, OD as Consulting Physician (Optometry) Merril Abbe, MD as Referring Physician (Dermatology)    Objective:    Vitals: BP 122/74 (BP Location: Left Arm, Patient Position: Sitting, Cuff Size: Normal)    Pulse 77    Temp 97.8 F (36.6 C) (Temporal)    Resp 16    Wt 176 lb (79.8 kg)    SpO2 99%    BMI 32.19 kg/m    Vitals:   03/24/19 1018  BP: 122/74  Pulse: 77  Resp: 16  Temp: 97.8 F (36.6 C)  TempSrc: Temporal  SpO2: 99%  Weight: 176 lb  (79.8 kg)     Physical Exam Vitals signs reviewed.  Constitutional:      General: She is not in acute distress.    Appearance: Normal appearance. She is well-developed. She is not ill-appearing or diaphoretic.  HENT:     Head: Normocephalic and atraumatic.     Right Ear: Tympanic membrane, ear canal and external ear normal.     Left Ear: Tympanic  membrane, ear canal and external ear normal.     Nose: Nose normal.     Mouth/Throat:     Mouth: Mucous membranes are moist.     Pharynx: No oropharyngeal exudate.  Eyes:     General: No scleral icterus.       Right eye: No discharge.        Left eye: No discharge.     Extraocular Movements: Extraocular movements intact.     Conjunctiva/sclera: Conjunctivae normal.     Pupils: Pupils are equal, round, and reactive to light.  Neck:     Musculoskeletal: Normal range of motion and neck supple.     Thyroid: No thyromegaly.     Vascular: No carotid bruit or JVD.     Trachea: No tracheal deviation.  Cardiovascular:     Rate and Rhythm: Normal rate and regular rhythm.     Pulses: Normal pulses.     Heart sounds: Normal heart sounds. No murmur. No friction rub. No gallop.   Pulmonary:     Effort: Pulmonary effort is normal. No respiratory distress.     Breath sounds: Normal breath sounds. No wheezing or rales.  Chest:     Chest wall: No tenderness.     Breasts:        Right: Normal.        Left: Normal.  Abdominal:     General: Bowel sounds are normal. There is no distension.     Palpations: Abdomen is soft. There is no mass.     Tenderness: There is no abdominal tenderness. There is no guarding or rebound.  Musculoskeletal: Normal range of motion.        General: No tenderness.     Right lower leg: No edema.     Left lower leg: No edema.  Lymphadenopathy:     Cervical: No cervical adenopathy.  Skin:    General: Skin is warm and dry.     Capillary Refill: Capillary refill takes less than 2 seconds.     Findings: No rash.    Neurological:     General: No focal deficit present.     Mental Status: She is alert and oriented to person, place, and time. Mental status is at baseline.  Psychiatric:        Mood and Affect: Mood normal.        Behavior: Behavior normal.        Thought Content: Thought content normal.        Judgment: Judgment normal.     Diabetic Foot Exam - Simple   Simple Foot Form Diabetic Foot exam was performed with the following findings: Yes 03/24/2019 10:46 AM  Visual Inspection No deformities, no ulcerations, no other skin breakdown bilaterally: Yes Sensation Testing Intact to touch and monofilament testing bilaterally: Yes Pulse Check Posterior Tibialis and Dorsalis pulse intact bilaterally: Yes Comments    Depression Screen PHQ 2/9 Scores 03/24/2019 01/26/2019 01/23/2018 01/22/2017  PHQ - 2 Score 0 0 0 0  PHQ- 9 Score 1 - - 0       Assessment & Plan:     Routine Health Maintenance and Physical Exam  Exercise Activities and Dietary recommendations Goals     DIET - REDUCE SUGAR INTAKE     Recommend monitoring intake of sugar and avoiding to help aid in weight loss.        Immunization History  Administered Date(s) Administered   Td 12/14/2000   Tdap 10/07/2012    Health Maintenance  Topic Date Due   FOOT EXAM  09/20/1961   PNA vac Low Risk Adult (1 of 2 - PCV13) 09/20/2016   HEMOGLOBIN A1C  07/31/2018   URINE MICROALBUMIN  01/27/2019   INFLUENZA VACCINE  02/27/2019   OPHTHALMOLOGY EXAM  04/01/2019   MAMMOGRAM  05/07/2019   Fecal DNA (Cologuard)  05/05/2021   DEXA SCAN  05/06/2022   TETANUS/TDAP  10/08/2022   Hepatitis C Screening  Completed     Discussed health benefits of physical activity, and encouraged her to engage in regular exercise appropriate for her age and condition.    1. Annual physical exam Normal physical exam today. Will check labs as below and f/u pending lab results. If labs are stable and WNL she will not need to have  these rechecked for one year at her next annual physical exam. She is to call the office in the meantime if she has any acute issue, questions or concerns. - CBC w/Diff/Platelet - Comprehensive Metabolic Panel (CMET) - TSH - Lipid Profile - HgB A1c  2. Breast cancer screening Breast exam today was normal. There is no family history of breast cancer. She does perform regular self breast exams. Mammogram was ordered as below. Information for [facility] clinic was given to patient so she may schedule her mammogram at her convenience. - MM 3D SCREEN BREAST BILATERAL; Future  3. Essential (primary) hypertension Stable. Will check labs as below and f/u pending results. - CBC w/Diff/Platelet - Comprehensive Metabolic Panel (CMET) - TSH - Lipid Profile - HgB A1c  4. Type 2 diabetes mellitus with stage 3 chronic kidney disease, without long-term current use of insulin (HCC) Diet controlled. Will check labs as below and f/u pending results. - CBC w/Diff/Platelet - Comprehensive Metabolic Panel (CMET) - TSH - Lipid Profile - HgB A1c  5. CKD (chronic kidney disease) stage 3, GFR 30-59 ml/min (HCC) Stable. Will check labs as below and f/u pending results. - CBC w/Diff/Platelet - Comprehensive Metabolic Panel (CMET) - TSH - Lipid Profile - HgB A1c  6. Abnormal liver enzymes H/O portal vein DVT. Will check labs as below and f/u pending results. - CBC w/Diff/Platelet - Comprehensive Metabolic Panel (CMET) - TSH - Lipid Profile - HgB A1c  7. Hypercholesteremia Diet controlled. Will check labs as below and f/u pending results. - CBC w/Diff/Platelet - Comprehensive Metabolic Panel (CMET) - TSH - Lipid Profile - HgB A1c  --------------------------------------------------------------------    Mar Daring, PA-C  [facility]

## 2019-03-25 ENCOUNTER — Telehealth: Payer: Self-pay

## 2019-03-25 ENCOUNTER — Other Ambulatory Visit: Payer: Self-pay | Admitting: Physician Assistant

## 2019-03-25 DIAGNOSIS — E1122 Type 2 diabetes mellitus with diabetic chronic kidney disease: Secondary | ICD-10-CM

## 2019-03-25 DIAGNOSIS — E7800 Pure hypercholesterolemia, unspecified: Secondary | ICD-10-CM

## 2019-03-25 LAB — LIPID PANEL
Chol/HDL Ratio: 6.5 ratio — ABNORMAL HIGH (ref 0.0–4.4)
Cholesterol, Total: 268 mg/dL — ABNORMAL HIGH (ref 100–199)
HDL: 41 mg/dL (ref >39)
LDL Calculated: 184 mg/dL — ABNORMAL HIGH (ref 0–99)
Triglycerides: 217 mg/dL — ABNORMAL HIGH (ref 0–149)
VLDL Cholesterol Cal: 43 mg/dL — ABNORMAL HIGH (ref 5–40)

## 2019-03-25 LAB — CBC WITH DIFFERENTIAL/PLATELET
Basophils Absolute: 0.1 10*3/uL (ref 0.0–0.2)
Basos: 1 %
EOS (ABSOLUTE): 0.3 10*3/uL (ref 0.0–0.4)
Eos: 5 %
Hematocrit: 41.6 % (ref 34.0–46.6)
Hemoglobin: 13.8 g/dL (ref 11.1–15.9)
Immature Grans (Abs): 0 10*3/uL (ref 0.0–0.1)
Immature Granulocytes: 0 %
Lymphocytes Absolute: 2.4 10*3/uL (ref 0.7–3.1)
Lymphs: 35 %
MCH: 28.9 pg (ref 26.6–33.0)
MCHC: 33.2 g/dL (ref 31.5–35.7)
MCV: 87 fL (ref 79–97)
Monocytes Absolute: 0.5 10*3/uL (ref 0.1–0.9)
Monocytes: 7 %
Neutrophils Absolute: 3.5 10*3/uL (ref 1.4–7.0)
Neutrophils: 52 %
Platelets: 217 10*3/uL (ref 150–450)
RBC: 4.78 x10E6/uL (ref 3.77–5.28)
RDW: 13.9 % (ref 11.7–15.4)
WBC: 6.8 10*3/uL (ref 3.4–10.8)

## 2019-03-25 LAB — COMPREHENSIVE METABOLIC PANEL
ALT: 22 IU/L (ref 0–32)
AST: 24 IU/L (ref 0–40)
Albumin/Globulin Ratio: 1.7 (ref 1.2–2.2)
Albumin: 4.5 g/dL (ref 3.8–4.8)
Alkaline Phosphatase: 91 IU/L (ref 39–117)
BUN/Creatinine Ratio: 11 — ABNORMAL LOW (ref 12–28)
BUN: 15 mg/dL (ref 8–27)
Bilirubin Total: 0.4 mg/dL (ref 0.0–1.2)
CO2: 22 mmol/L (ref 20–29)
Calcium: 10 mg/dL (ref 8.7–10.3)
Chloride: 103 mmol/L (ref 96–106)
Creatinine, Ser: 1.31 mg/dL — ABNORMAL HIGH (ref 0.57–1.00)
GFR calc Af Amer: 49 mL/min/{1.73_m2} — ABNORMAL LOW (ref >59)
GFR calc non Af Amer: 42 mL/min/{1.73_m2} — ABNORMAL LOW (ref >59)
Globulin, Total: 2.7 g/dL (ref 1.5–4.5)
Glucose: 110 mg/dL — ABNORMAL HIGH (ref 65–99)
Potassium: 4.2 mmol/L (ref 3.5–5.2)
Sodium: 140 mmol/L (ref 134–144)
Total Protein: 7.2 g/dL (ref 6.0–8.5)

## 2019-03-25 LAB — TSH: TSH: 3.13 u[IU]/mL (ref 0.450–4.500)

## 2019-03-25 LAB — HEMOGLOBIN A1C
Est. average glucose Bld gHb Est-mCnc: 148 mg/dL
Hgb A1c MFr Bld: 6.8 % — ABNORMAL HIGH (ref 4.8–5.6)

## 2019-03-25 MED ORDER — GLUCOSE BLOOD VI STRP: ORAL_STRIP | 12 refills | Status: DC

## 2019-03-25 MED ORDER — FENOFIBRATE 145 MG PO TABS: 145.0000 mg | ORAL_TABLET | Freq: Every day | ORAL | 1 refills | Status: DC

## 2019-03-25 NOTE — Telephone Encounter (Signed)
Patient advised as below. Patient verbalizes understanding and is in agreement with treatment plan. Patient agreed to start either medication.

## 2019-03-25 NOTE — Telephone Encounter (Signed)
Fenofibrate sent in as it may be more effective.  If she does not tolerate can try Vascepa.

## 2019-03-25 NOTE — Telephone Encounter (Signed)
[facility] faxed refill request for the following medications:  ONE TOUCH ULTRA TEST test strip    Please advise.

## 2019-03-25 NOTE — Telephone Encounter (Signed)
-----   Message from Mar Daring, [place] sent at 03/25/2019  1:28 PM EDT ----- Blood count is normal. Kidney function is stable. Liver enzymes are normal. Sodium, potassium and calcium are normal. Thyroid is normal. Cholesterol is elevated more compared to last year. May consider Fenofibrate or Vascepa (high dose purified Rx fish oil) since intolerant to statins if desired.

## 2019-05-31 ENCOUNTER — Other Ambulatory Visit: Payer: Self-pay

## 2019-05-31 DIAGNOSIS — N200 Calculus of kidney: Secondary | ICD-10-CM

## 2019-06-01 ENCOUNTER — Other Ambulatory Visit
Admission: RE | Admit: 2019-06-01 | Discharge: 2019-06-01 | Disposition: A | Discharge status: Home / Self Care | Payer: Medicare Other | Attending: Urology | Admitting: Urology

## 2019-06-01 ENCOUNTER — Ambulatory Visit: Payer: Medicare Other | Admitting: Urology

## 2019-06-01 ENCOUNTER — Other Ambulatory Visit: Payer: Self-pay

## 2019-06-01 ENCOUNTER — Encounter: Payer: Self-pay | Admitting: Urology

## 2019-06-01 VITALS — BP 146/79 | HR 86 | Ht 62.0 in | Wt 176.0 lb

## 2019-06-01 DIAGNOSIS — N200 Calculus of kidney: Secondary | ICD-10-CM

## 2019-06-01 LAB — URINALYSIS, COMPLETE (UACMP) WITH MICROSCOPIC
Bacteria, UA: NONE SEEN
Bilirubin Urine: NEGATIVE
Glucose, UA: NEGATIVE mg/dL
Hgb urine dipstick: NEGATIVE
Ketones, ur: NEGATIVE mg/dL
Leukocytes,Ua: NEGATIVE
Nitrite: NEGATIVE
Protein, ur: NEGATIVE mg/dL
RBC / HPF: NONE SEEN RBC/hpf (ref 0–5)
Specific Gravity, Urine: 1.025 (ref 1.005–1.030)
pH: 5 (ref 5.0–8.0)

## 2019-06-01 NOTE — Progress Notes (Signed)
06/01/19 1:07 PM   Braulio Conte 1950/11/13 [id number]  Referring provider: Mar Daring, PA-C [address] [place],  [place] [address]  CC: Nephrolithiasis  HPI: I saw Ms. Nicole Kindred in urology clinic for the first time today for evaluation of nephrolithiasis.  She is a 68 year old female with history of CKD and well-controlled diabetes who recently had an episode of left-sided renal colic with nausea and vomiting while she was on vacation in [place].  This lasted for 1 week and she was seen in the ER there where CT scan showed a left 3 mm proximal ureteral stone.  Two days ago her pain completely resolved and she passed a small 3 mm stone.  She denies any complaints today, and specifically denies gross hematuria, dysuria, or flank pain.  She reports 1 prior stone episode which she passed spontaneously 15 years ago.  There are no aggravating or alleviating factors.  Severity is mild.  I am unable to see her CT to evaluate for any other stones, however the report does not mention any additional nephrolithiasis.  PMH: Past Medical History:  Diagnosis Date  . Allergy   . Anxiety   . Arthritis   . Basal cell carcinoma    in vaginal area  . Basal cell carcinoma (BCC) in situ of skin   . Chronic kidney disease   . Depression   . Diabetes mellitus without complication ([place])   . Genital warts    one time a long time ago  . Hyperlipidemia   . Hypertension   . Portal vein thrombosis   . VAIN I (vaginal intraepithelial neoplasia grade I)     Surgical History: Past Surgical History:  Procedure Laterality Date  . ABDOMINAL HYSTERECTOMY  1986  . BLADDER SURGERY     at the same time of hysterectomy  . FOOT SURGERY Left 08/20/2012   Dr. Elvina Mattes  . WISDOM TOOTH EXTRACTION  1986    Allergies:  Allergies  Allergen Reactions  . Atorvastatin Other (See Comments)    Muscle spasms  . Lovastatin     muscle aches  . Statins Other (See Comments)  . Wheat Bran  Swelling  . Zetia  [Ezetimibe]     muscle aches  . Metronidazole Nausea And Vomiting and Rash    Family History: Family History  Problem Relation Age of Onset  . Parkinsonism Mother   . Breast cancer Mother 69  . Alcohol abuse Father   . Lymphoma Son     Social History:  reports that she has never smoked. She has never used smokeless tobacco. She reports that she does not drink alcohol or use drugs.  ROS: Please see flowsheet from today's date for complete review of systems.  Physical Exam: BP (!) 146/79 (BP Location: Left Arm, Patient Position: Sitting, Cuff Size: Normal)   Pulse 86   Ht 5\' 2"  (1.575 m)   Wt 176 lb (79.8 kg)   BMI 32.19 kg/m    Constitutional:  Alert and oriented, No acute distress. Cardiovascular: No clubbing, cyanosis, or edema. Respiratory: Normal respiratory effort, no increased work of breathing. GI: Abdomen is soft, nontender, nondistended, no abdominal masses GU: No CVA tenderness Lymph: No cervical or inguinal lymphadenopathy. Skin: No rashes, bruises or suspicious lesions. Neurologic: Grossly intact, no focal deficits, moving all 4 extremities. Psychiatric: Normal mood and affect.  Laboratory Data: Urinalysis today no bacteria, nitrite negative, no leukocytes, 6-10 WBCs, 0 RBCs  Assessment & Plan:   In summary, the  patient is a relatively healthy 68 year old female with 1 prior episode of spontaneously passed stone 10 years ago, and recent 3 mm spontaneously passed stone who symptoms have completely resolved.  We discussed general stone prevention strategies including adequate hydration with goal of producing 2.5 L of urine daily, increasing citric acid intake, increasing calcium intake during high oxalate meals, minimizing animal protein, and decreasing salt intake. Information about dietary recommendations given today.   RTC 1 year with KUB Call with stone analysis results  A total of 30 minutes were spent face-to-face with the patient,  greater than 50% was spent in patient education, counseling, and coordination of care regarding nephrolithiasis and stone prevention.   Billey Co, [facility] [address] [place],  [address] [phone]

## 2019-06-01 NOTE — Patient Instructions (Signed)
Dietary Guidelines to Help Prevent Kidney Stones Kidney stones are deposits of minerals and salts that form inside your kidneys. Your risk of developing kidney stones may be greater depending on your diet, your lifestyle, the medicines you take, and whether you have certain medical conditions. Most people can reduce their chances of developing kidney stones by following the instructions below. Depending on your overall health and the type of kidney stones you tend to develop, your dietitian may give you more specific instructions. What are tips for following this plan? Reading food labels  Choose foods with "no salt added" or "low-salt" labels. Limit your sodium intake to less than 1500 mg per day.  Choose foods with calcium for each meal and snack. Try to eat about 300 mg of calcium at each meal. Foods that contain 200-500 mg of calcium per serving include: ? 8 oz (237 ml) of milk, fortified nondairy milk, and fortified fruit juice. ? 8 oz (237 ml) of kefir, yogurt, and soy yogurt. ? 4 oz (118 ml) of tofu. ? 1 oz of cheese. ? 1 cup (300 g) of dried figs. ? 1 cup (91 g) of cooked broccoli. ? 1-3 oz can of sardines or mackerel.  Most people need 1000 to 1500 mg of calcium each day. Talk to your dietitian about how much calcium is recommended for you. Shopping  Buy plenty of fresh fruits and vegetables. Most people do not need to avoid fruits and vegetables, even if they contain nutrients that may contribute to kidney stones.  When shopping for convenience foods, choose: ? Whole pieces of fruit. ? Premade salads with dressing on the side. ? Low-fat fruit and yogurt smoothies.  Avoid buying frozen meals or prepared deli foods.  Look for foods with live cultures, such as yogurt and kefir. Cooking  Do not add salt to food when cooking. Place a salt shaker on the table and allow each person to add his or her own salt to taste.  Use vegetable protein, such as beans, textured vegetable  protein (TVP), or tofu instead of meat in pasta, casseroles, and soups. Meal planning   Eat less salt, if told by your dietitian. To do this: ? Avoid eating processed or premade food. ? Avoid eating fast food.  Eat less animal protein, including cheese, meat, poultry, or fish, if told by your dietitian. To do this: ? Limit the number of times you have meat, poultry, fish, or cheese each week. Eat a diet free of meat at least 2 days a week. ? Eat only one serving each day of meat, poultry, fish, or seafood. ? When you prepare animal protein, cut pieces into small portion sizes. For most meat and fish, one serving is about the size of one deck of cards.  Eat at least 5 servings of fresh fruits and vegetables each day. To do this: ? Keep fruits and vegetables on hand for snacks. ? Eat 1 piece of fruit or a handful of berries with breakfast. ? Have a salad and fruit at lunch. ? Have two kinds of vegetables at dinner.  Limit foods that are high in a substance called oxalate. These include: ? Spinach. ? Rhubarb. ? Beets. ? Potato chips and french fries. ? Nuts.  If you regularly take a diuretic medicine, make sure to eat at least 1-2 fruits or vegetables high in potassium each day. These include: ? Avocado. ? Banana. ? Orange, prune, carrot, or tomato juice. ? Baked potato. ? Cabbage. ? Beans and split   peas. General instructions   Drink enough fluid to keep your urine clear or pale yellow. This is the most important thing you can do.  Talk to your health care provider and dietitian about taking daily supplements. Depending on your health and the cause of your kidney stones, you may be advised: ? Not to take supplements with vitamin C. ? To take a calcium supplement. ? To take a daily probiotic supplement. ? To take other supplements such as magnesium, fish oil, or vitamin B6.  Take all medicines and supplements as told by your health care provider.  Limit alcohol intake to no  more than 1 drink a day for nonpregnant women and 2 drinks a day for men. One drink equals 12 oz of beer, 5 oz of wine, or 1 oz of hard liquor.  Lose weight if told by your health care provider. Work with your dietitian to find strategies and an eating plan that works best for you. What foods are not recommended? Limit your intake of the following foods, or as told by your dietitian. Talk to your dietitian about specific foods you should avoid based on the type of kidney stones and your overall health. Grains Breads. Bagels. Rolls. Baked goods. Salted crackers. Cereal. Pasta. Vegetables Spinach. Rhubarb. Beets. Canned vegetables. Angie Fava. Olives. Meats and other protein foods Nuts. Nut butters. Large portions of meat, poultry, or fish. Salted or cured meats. Deli meats. Hot dogs. Sausages. Dairy Cheese. Beverages Regular soft drinks. Regular vegetable juice. Seasonings and other foods Seasoning blends with salt. Salad dressings. Canned soups. Soy sauce. Ketchup. Barbecue sauce. Canned pasta sauce. Casseroles. Pizza. Lasagna. Frozen meals. Potato chips. [place] fries. Summary  You can reduce your risk of kidney stones by making changes to your diet.  The most important thing you can do is drink enough fluid. You should drink enough fluid to keep your urine clear or pale yellow.  Ask your health care provider or dietitian how much protein from animal sources you should eat each day, and also how much salt and calcium you should have each day. This information is not intended to replace advice given to you by your health care provider. Make sure you discuss any questions you have with your health care provider. Document Released: 11/09/2010 Document Revised: 11/04/2018 Document Reviewed: 06/25/2016 Elsevier Patient Education  2020 [employer].   Chronic Kidney Disease, Adult Chronic kidney disease (CKD) occurs when the kidneys become damaged slowly over a long period of time. The kidneys  are a pair of organs that do many important jobs in the body, including:  Removing waste and extra fluid from the blood to make urine.  Making hormones that maintain the amount of fluid in tissues and blood vessels.  Maintaining the right amount of fluids and chemicals in the body. A small amount of kidney damage may not cause problems, but a large amount of damage may make it hard or impossible for the kidneys to work the way they should. If steps are not taken to slow down kidney damage or to stop it from getting worse, the kidneys may stop working permanently (end-stage renal disease or ESRD). Most of the time, CKD does not go away, but it can often be controlled. People who have CKD are usually able to live normal lives. What are the causes? The most common causes of this condition are diabetes and high blood pressure (hypertension). Other causes include:  Heart and blood vessel (cardiovascular) disease.  Kidney diseases, such as: ? Glomerulonephritis. ?  Interstitial nephritis. ? Polycystic kidney disease. ? Renal vascular disease.  Diseases that affect the immune system.  Genetic diseases.  Medicines that damage the kidneys, such as anti-inflammatory medicines.  Being around or being in contact with poisonous (toxic) substances.  A kidney or urinary infection that occurs again and again (recurs).  Vasculitis. This is swelling or inflammation of the blood vessels.  A problem with urine flow that may be caused by: ? Cancer. ? Having kidney stones more than one time. ? An enlarged prostate, in males. What increases the risk? You are more likely to develop this condition if you:  Are older than age 80.  Are female.  Are African-American, Hispanic, Asian, [place], or American [place].  Are a current or former smoker.  Are obese.  Have a family history of kidney disease or failure.  Often take medicines that are damaging to the kidneys. What are the signs or  symptoms? Symptoms of this condition include:  Swelling (edema) of the face, legs, ankles, or feet.  Tiredness (lethargy) and having less energy.  Nausea or vomiting.  Confusion or trouble concentrating.  Problems with urination, such as: ? Painful or burning feeling during urination. ? Decreased urine production. ? Frequent urination, especially at night. ? Bloody urine.  Muscle twitches and cramps, especially in the legs.  Shortness of breath.  Weakness.  Loss of appetite.  Metallic taste in the mouth.  Trouble sleeping.  Dry, itchy skin.  A low blood count (anemia).  Pale lining of the eyelids and surface of the eye (conjunctiva). Symptoms develop slowly and may not be obvious until the kidney damage becomes severe. It is possible to have kidney disease for years without having any symptoms. How is this diagnosed? This condition may be diagnosed based on:  Blood tests.  Urine tests.  Imaging tests, such as an ultrasound or CT scan.  A test in which a sample of tissue is removed from the kidneys to be examined under a microscope (kidney biopsy). These test results will help your health care provider determine how serious the CKD is. How is this treated? There is no cure for most cases of this condition, but treatment usually relieves symptoms and prevents or slows the progression of the disease. Treatment may include:  Making diet changes, which may require you to avoid alcohol, salty foods (sodium), and foods that are high in potassium, calcium, and protein.  Medicines: ? To lower blood pressure. ? To control blood glucose. ? To relieve anemia. ? To relieve swelling. ? To protect your bones. ? To improve the balance of electrolytes in your blood.  Removing toxic waste from the body through types of dialysis, if the kidneys can no longer do their job (kidney failure).  Managing any other conditions that are causing your CKD or making it worse. Follow  these instructions at home: Medicines  Take over-the-counter and prescription medicines only as told by your health care provider. The dose of some medicines that you take may need to be adjusted.  Do not take any new medicines unless approved by your health care provider. Many medicines can worsen your kidney damage.  Do not take any vitamin and mineral supplements unless approved by your health care provider. Many nutritional supplements can worsen your kidney damage. General instructions  Follow your prescribed diet as told by your health care provider.  Do not use any products that contain nicotine or tobacco, such as cigarettes and e-cigarettes. If you need help quitting, ask  your health care provider.  Monitor and track your blood pressure at home. Report changes in your blood pressure as told by your health care provider.  If you are being treated for diabetes, monitor and track your blood sugar (blood glucose) levels as told by your health care provider.  Maintain a healthy weight. If you need help with this, ask your health care provider.  Start or continue an exercise plan. Exercise at least 30 minutes a day, 5 days a week.  Keep your immunizations up to date as told by your health care provider.  Keep all follow-up visits as told by your health care provider. This is important. Where to find more information  American Association of Kidney Patients: BombTimer.gl  National Kidney Foundation: www.kidney.[place]: https://mathis.com/  [facility]: www.lifeoptions.org and www.kidneyschool.org Contact a health care provider if:  Your symptoms get worse.  You develop new symptoms. Get help right away if:  You develop symptoms of ESRD, which include: ? Headaches. ? Numbness in the hands or feet. ? Easy bruising. ? Frequent hiccups. ? Chest pain. ? Shortness of breath. ? Lack of menstruation, in women.  You have a fever.  You  have decreased urine production.  You have pain or bleeding when you urinate. Summary  Chronic kidney disease (CKD) occurs when the kidneys become damaged slowly over a long period of time.  The most common causes of this condition are diabetes and high blood pressure (hypertension).  There is no cure for most cases of this condition, but treatment usually relieves symptoms and prevents or slows the progression of the disease. Treatment may include a combination of medicines and lifestyle changes. This information is not intended to replace advice given to you by your health care provider. Make sure you discuss any questions you have with your health care provider. Document Released: 04/23/2008 Document Revised: 06/27/2017 Document Reviewed: 08/22/2016 Elsevier Patient Education  2020 [employer].

## 2019-06-03 ENCOUNTER — Other Ambulatory Visit: Payer: Self-pay

## 2019-06-07 ENCOUNTER — Other Ambulatory Visit: Payer: Self-pay | Admitting: Urology

## 2019-06-07 ENCOUNTER — Telehealth: Payer: Self-pay

## 2019-06-07 NOTE — Progress Notes (Signed)
Her stone was mostly calcium oxalate, the most common type of stone.   Nickolas Madrid, MD 06/07/2019

## 2019-06-07 NOTE — Telephone Encounter (Signed)
-----   Message from Billey Co, MD sent at 06/07/2019  1:02 PM EST -----   ----- Message ----- From: Delman Cheadle Sent: 06/07/2019  10:27 AM EST To: Billey Co, MD

## 2019-06-07 NOTE — Telephone Encounter (Signed)
Her stone was mostly calcium oxalate, the most common type of stone.   Nickolas Madrid, MD 06/07/2019  mychart notification sent

## 2019-06-09 ENCOUNTER — Ambulatory Visit: Payer: Medicare Other | Admitting: Urology

## 2019-06-23 ENCOUNTER — Encounter: Payer: Self-pay | Admitting: Physician Assistant

## 2019-07-29 ENCOUNTER — Telehealth: Payer: Medicare Other | Admitting: Emergency Medicine

## 2019-07-29 DIAGNOSIS — B349 Viral infection, unspecified: Secondary | ICD-10-CM | POA: Diagnosis not present

## 2019-07-29 MED ORDER — FLUTICASONE PROPIONATE 50 MCG/ACT NA SUSP: 2.0000 | Freq: Every day | NASAL | 0 refills | Status: DC

## 2019-07-29 MED ORDER — BENZONATATE 100 MG PO CAPS: 100.0000 mg | ORAL_CAPSULE | Freq: Two times a day (BID) | ORAL | 0 refills | Status: DC | PRN

## 2019-07-29 NOTE — Progress Notes (Signed)
We are sorry you are not feeling well.  Here is how we plan to help!  Based on what you have shared with me, it looks like you may have a viral upper respiratory infection.  Upper respiratory infections are caused by a large number of viruses; however, rhinovirus is the most common cause.   Symptoms vary from person to person, with common symptoms including sore throat, cough, fatigue or lack of energy and feeling of general discomfort.  A low-grade fever of up to 100.4 may present, but is often uncommon.  Symptoms vary however, and are closely related to a person's age or underlying illnesses.  The most common symptoms associated with an upper respiratory infection are nasal discharge or congestion, cough, sneezing, headache and pressure in the ears and face.  These symptoms usually persist for about 3 to 10 days, but can last up to 2 weeks.  It is important to know that upper respiratory infections do not cause serious illness or complications in most cases.    Upper respiratory infections can be transmitted from person to person, with the most common method of transmission being a person's hands.  The virus is able to live on the skin and can infect other persons for up to 2 hours after direct contact.  Also, these can be transmitted when someone coughs or sneezes; thus, it is important to cover the mouth to reduce this risk.  To keep the spread of the illness at [place], good hand hygiene is very important.  This is an infection that is most likely caused by a virus. There are no specific treatments other than to help you with the symptoms until the infection runs its course.  We are sorry you are not feeling well.     Without a fever, I think it is unlikely that you have COVID-19, but if you have a known exposure, you should be tested.  Additionally, with the lack of fever and presence of cough, strep throat is unlikely and you don't meet criteria for antibiotic therapy.  That being said, there are a  few things we can do to help with your symptoms.   Here is how we plan to help!   For nasal congestion, you may use an oral decongestants such as Mucinex D or if you have glaucoma or high blood pressure use plain Mucinex.  Saline nasal spray or nasal drops can help and can safely be used as often as needed for congestion.  For your congestion, I have prescribed Fluticasone nasal spray one spray in each nostril twice a day  If you do not have a history of heart disease, hypertension, diabetes or thyroid disease, prostate/bladder issues or glaucoma, you may also use Sudafed to treat nasal congestion.  It is highly recommended that you consult with a pharmacist or your primary care physician to ensure this medication is safe for you to take.     If you have a cough, you may use cough suppressants such as Delsym and Robitussin.  If you have glaucoma or high blood pressure, you can also use Coricidin HBP.   For cough I have prescribed for you A prescription cough medication called Tessalon Perles 100 mg. You may take 1-2 capsules every 8 hours as needed for cough  If you have a sore or scratchy throat, use a saltwater gargle-  to  teaspoon of salt dissolved in a 4-ounce to 8-ounce glass of warm water.  Gargle the solution for approximately 15-30 seconds and then  spit.  It is important not to swallow the solution.  You can also use throat lozenges/cough drops and Chloraseptic spray to help with throat pain or discomfort.  Warm or cold liquids can also be helpful in relieving throat pain.  For headache, pain or general discomfort, you can use Ibuprofen or Tylenol as directed.   Some authorities believe that zinc sprays or the use of Echinacea may shorten the course of your symptoms.   HOME CARE . Only take medications as instructed by your medical team. . Be sure to drink plenty of fluids. Water is fine as well as fruit juices, sodas and electrolyte beverages. You may want to stay away from caffeine  or alcohol. If you are nauseated, try taking small sips of liquids. How do you know if you are getting enough fluid? Your urine should be a pale yellow or almost colorless. . Get rest. . Taking a steamy shower or using a humidifier may help nasal congestion and ease sore throat pain. You can place a towel over your head and breathe in the steam from hot water coming from a faucet. . Using a saline nasal spray works much the same way. . Cough drops, hard candies and sore throat lozenges may ease your cough. . Avoid close contacts especially the very young and the elderly . Cover your mouth if you cough or sneeze . Always remember to wash your hands.   GET HELP RIGHT AWAY IF: . You develop worsening fever. . If your symptoms do not improve within 10 days . You develop yellow or green discharge from your nose over 3 days. . You have coughing fits . You develop a severe head ache or visual changes. . You develop shortness of breath, difficulty breathing or start having chest pain . Your symptoms persist after you have completed your treatment plan  MAKE SURE YOU   Understand these instructions.  Will watch your condition.  Will get help right away if you are not doing well or get worse.  Your e-visit answers were reviewed by a board certified advanced clinical practitioner to complete your personal care plan. Depending upon the condition, your plan could have included both over the counter or prescription medications. Please review your pharmacy choice. If there is a problem, you may call our nursing hot line at and have the prescription routed to another pharmacy. Your safety is important to [place]. If you have drug allergies check your prescription carefully.   You can use MyChart to ask questions about today's visit, request a non-urgent call back, or ask for a work or school excuse for 24 hours related to this e-Visit. If it has been greater than 24 hours you will need to follow up with  your provider, or enter a new e-Visit to address those concerns. You will get an e-mail in the next two days asking about your experience.  I hope that your e-visit has been valuable and will speed your recovery. Thank you for using e-visits.   Approximately 5 minutes was used in reviewing the patient's chart, questionnaire, prescribing medications, and documentation.

## 2019-08-14 ENCOUNTER — Other Ambulatory Visit: Payer: Self-pay | Admitting: Physician Assistant

## 2019-08-14 DIAGNOSIS — F32A Depression, unspecified: Secondary | ICD-10-CM

## 2019-08-14 DIAGNOSIS — F329 Major depressive disorder, single episode, unspecified: Secondary | ICD-10-CM

## 2019-09-07 LAB — HM DIABETES EYE EXAM

## 2019-09-16 ENCOUNTER — Other Ambulatory Visit: Payer: Self-pay | Admitting: Physician Assistant

## 2019-09-16 ENCOUNTER — Encounter: Payer: Self-pay | Admitting: *Deleted

## 2019-09-16 DIAGNOSIS — E7800 Pure hypercholesterolemia, unspecified: Secondary | ICD-10-CM

## 2020-01-26 NOTE — Progress Notes (Signed)
Subjective:   [name] [name] is a 69 y.o. female who presents for Medicare Annual (Subsequent) preventive examination.  I connected with [name] [name] today by telephone and verified that I am speaking with the correct person using two identifiers. Location patient: home Location provider: work Persons participating in the virtual visit: patient, provider.   I discussed the limitations, risks, security and privacy concerns of performing an evaluation and management service by telephone and the availability of in person appointments. I also discussed with the patient that there may be a patient responsible charge related to this service. The patient expressed understanding and verbally consented to this telephonic visit.    Interactive audio and video telecommunications were attempted between this provider and patient, however failed, due to patient having technical difficulties OR patient did not have access to video capability.  We continued and completed visit with audio only.  Review of Systems    N/A  Cardiac Risk Factors include: advanced age (>73men, >93 women);diabetes mellitus     Objective:    There were no vitals filed for this visit. There is no height or weight on file to calculate BMI.  Advanced Directives 01/27/2020 01/26/2019 01/23/2018 07/02/2017 01/22/2017 07/06/2015 04/05/2015  Does Patient Have a Medical Advance Directive? No No No No No No No  Does patient want to make changes to medical advance directive? - - Yes (MAU/Ambulatory/Procedural Areas - Information given) - - - -  Would patient like information on creating a medical advance directive? No - Patient declined No - Patient declined - Yes (MAU/Ambulatory/Procedural Areas - Information given) - - -    Current Medications (verified) Outpatient Encounter Medications as of 01/27/2020  Medication Sig  . acetaminophen (TYLENOL) 500 MG tablet Take 2 tablets (1,000 mg total) by mouth every 8 (eight) hours as needed.  Marland Kitchen  aspirin EC 81 MG tablet Take 1 tablet (81 mg total) by mouth daily.  . Calcium-Magnesium-Vitamin D (CALCIUM 1200+D3 PO) Take 1 capsule by mouth daily. Vitamin d 3 is 1000 IU  . Cholecalciferol (VITAMIN D3) 2000 units TABS Take 1 capsule by mouth daily.  . fenofibrate (TRICOR) 145 MG tablet TAKE 1 TABLET BY MOUTH EVERY DAY (Patient taking differently: every other day. )  . glucose blood test strip Check blood sugar once daily.  Marland Kitchen ibuprofen (ADVIL) 600 MG tablet Take 600 mg by mouth every 6 (six) hours as needed.  . ONE TOUCH ULTRA TEST test strip CHECK BLOOD SUGAR ONCE DAILY  . sertraline (ZOLOFT) 100 MG tablet TAKE 1 TABLET BY MOUTH EVERYDAY AT BEDTIME  . benzonatate (TESSALON) 100 MG capsule Take 1 capsule (100 mg total) by mouth 2 (two) times daily as needed for cough. (Patient not taking: Reported on 01/27/2020)  . fluticasone (FLONASE) 50 MCG/ACT nasal spray Place 2 sprays into both nostrils daily. (Patient not taking: Reported on 01/27/2020)  . ondansetron (ZOFRAN-ODT) 4 MG disintegrating tablet SMARTSIG:1 Tablet(s) By Mouth Every 12 Hours PRN (Patient not taking: Reported on 01/27/2020)  . tamsulosin (FLOMAX) 0.4 MG CAPS capsule Take 0.4 mg by mouth daily. (Patient not taking: Reported on 01/27/2020)   No facility-administered encounter medications on file as of 01/27/2020.    Allergies (verified) Atorvastatin, Lovastatin, Statins, Wheat bran, Zetia  [ezetimibe], and Metronidazole   History: Past Medical History:  Diagnosis Date  . Allergy   . Anxiety   . Arthritis   . Basal cell carcinoma    in vaginal area  . Basal cell carcinoma (BCC) in situ of skin   .  Chronic kidney disease   . Depression   . Diabetes mellitus without complication ([place])   . Genital warts    one time a long time ago  . Hyperlipidemia   . Hypertension   . Portal vein thrombosis   . VAIN I (vaginal intraepithelial neoplasia grade I)    Past Surgical History:  Procedure Laterality Date  . ABDOMINAL HYSTERECTOMY   1986  . BLADDER SURGERY     at the same time of hysterectomy  . FOOT SURGERY Left 08/20/2012   Dr. Elvina Mattes  . WISDOM TOOTH EXTRACTION  1986   Family History  Problem Relation Age of Onset  . Parkinsonism Mother   . Breast cancer Mother 44  . Alcohol abuse Father   . Lymphoma Son    Social History   Socioeconomic History  . Marital status: Married    Spouse name: Remo Lipps  . Number of children: 3  . Years of education: 4 years college  . Highest education level: Bachelor's degree (e.g., BA, AB, BS)  Occupational History  . Occupation: retired Pharmacist, hospital  . Occupation: book Visual merchandiser    Comment: part time  Tobacco Use  . Smoking status: Never Smoker  . Smokeless tobacco: Never Used  Vaping Use  . Vaping Use: Never used  Substance and Sexual Activity  . Alcohol use: No  . Drug use: No  . Sexual activity: Not Currently    Birth control/protection: Post-menopausal, Surgical  Other Topics Concern  . Not on file  Social History Narrative  . Not on file   Social Determinants of Health   Financial Resource Strain: Low Risk   . Difficulty of Paying Living Expenses: Not hard at all  Food Insecurity: No Food Insecurity  . Worried About Charity fundraiser in the Last Year: Never true  . Ran Out of Food in the Last Year: Never true  Transportation Needs: No Transportation Needs  . Lack of Transportation (Medical): No  . Lack of Transportation (Non-Medical): No  Physical Activity: Inactive  . Days of Exercise per Week: 0 days  . Minutes of Exercise per Session: 0 min  Stress: No Stress Concern Present  . Feeling of Stress : Not at all  Social Connections: Moderately Isolated  . Frequency of Communication with Friends and Family: More than three times a week  . Frequency of Social Gatherings with Friends and Family: More than three times a week  . Attends Religious Services: Never  . Active Member of Clubs or Organizations: No  . Attends Archivist Meetings: Never  .  Marital Status: Married    Tobacco Counseling Counseling given: Not Answered   Clinical Intake:  Pre-visit preparation completed: Yes  Pain : No/denies pain     Nutritional Risks: None Diabetes: Yes  How often do you need to have someone help you when you read instructions, pamphlets, or other written materials from your doctor or pharmacy?: 1 - Never  Diabetic? Yes  Nutrition Risk Assessment:  Has the patient had any N/V/D within the last 2 months?  No  Does the patient have any non-healing wounds?  No  Has the patient had any unintentional weight loss or weight gain?  No   Diabetes:  Is the patient diabetic?  Yes  If diabetic, was a CBG obtained today?  No  Did the patient bring in their glucometer from home?  No  How often do you monitor your CBG's? Occasionally depending on "how she feels."   Financial Strains and  Diabetes Management:  Are you having any financial strains with the device, your supplies or your medication? No .  Does the patient want to be seen by Chronic Care Management for management of their diabetes?  No  Would the patient like to be referred to a Nutritionist or for Diabetic Management?  No   Diabetic Exams:  Diabetic Eye Exam: Completed 09/07/19 Diabetic Foot Exam: Completed 03/24/19   Interpreter Needed?: No  Information entered by :: [facility], LPN   Activities of Daily Living In your present state of health, do you have any difficulty performing the following activities: 01/27/2020 03/24/2019  Hearing? N N  Vision? N N  Difficulty concentrating or making decisions? N N  Walking or climbing stairs? N N  Dressing or bathing? N N  Doing errands, shopping? N N  Preparing Food and eating ? N -  Using the Toilet? N -  In the past six months, have you accidently leaked urine? Y -  Comment Occasionally with pressure. -  Do you have problems with loss of bowel control? N -  Managing your Medications? N -  Managing your Finances? N -    Housekeeping or managing your Housekeeping? N -  Some recent data might be hidden    Patient Care Team: Mar Daring, PA-C as PCP - General (Family Medicine) Leata Mouse (Inactive) as Consulting Physician Arelia Sneddon, [place] as Consulting Physician (Optometry) Manley Mason Donivan Scull, MD as Referring Physician (Dermatology) Billey Co, MD as Consulting Physician (Urology)  Indicate any recent Medical Services you may have received from other than [facility] providers in the past year (date may be approximate).     Assessment:   This is a routine wellness examination for [name] [name].  Hearing/Vision screen No exam data present  Dietary issues and exercise activities discussed: Current Exercise Habits: The patient does not participate in regular exercise at present, Exercise limited by: None identified  Goals    . DIET - INCREASE WATER INTAKE     Recommend to drink at least 6-8 8oz glasses of water per day.      Depression Screen PHQ 2/9 Scores 01/27/2020 03/24/2019 01/26/2019 01/23/2018 01/22/2017 09/30/2016  PHQ - 2 Score 0 0 0 0 0 0  PHQ- 9 Score - 1 - - 0 0    Fall Risk Fall Risk  01/27/2020 03/24/2019 01/26/2019 01/23/2018 01/22/2017  Falls in the past year? 0 0 1 No No  Number falls in past yr: 0 0 1 - -  Injury with Fall? 0 0 0 - -  Follow up - - Falls prevention discussed - -    Any stairs in or around the home? Yes  If so, are there any without handrails? No  Home free of loose throw rugs in walkways, pet beds, electrical cords, etc? Yes  Adequate lighting in your home to reduce risk of falls? Yes   ASSISTIVE DEVICES UTILIZED TO PREVENT FALLS:  Life alert? No  Use of a cane, walker or w/c? No  Grab bars in the bathroom? Yes  Shower chair or bench in shower? No  Elevated toilet seat or a handicapped toilet? Yes    Cognitive Function:     6CIT Screen 01/27/2020 01/26/2019 01/23/2018  What Year? 0 points 0 points 0 points  What month? 0 points 0 points 0 points   What time? 0 points 0 points 0 points  Count back from 20 0 points 0 points 0 points  Months in reverse 0 points 0  points 0 points  Repeat phrase 0 points 0 points 0 points  Total Score 0 0 0    Immunizations Immunization History  Administered Date(s) Administered  . PFIZER SARS-COV-2 Vaccination 10/20/2019, 11/10/2019  . Td 12/14/2000  . Tdap 10/07/2012    TDAP status: Up to date Flu Vaccine status: Due fall 2021 Pneumococcal vaccine status: Declined,  Education has been provided regarding the importance of this vaccine but patient still declined. Advised may receive this vaccine at local pharmacy or Health Dept. Aware to provide a copy of the vaccination record if obtained from local pharmacy or Health Dept. Verbalized acceptance and understanding.  Covid-19 vaccine status: Completed vaccines  Qualifies for Shingles Vaccine? Yes   Zostavax completed No   Shingrix Completed?: No.    Education has been provided regarding the importance of this vaccine. Patient has been advised to call insurance company to determine out of pocket expense if they have not yet received this vaccine. Advised may also receive vaccine at local pharmacy or Health Dept. Verbalized acceptance and understanding.  Screening Tests Health Maintenance  Topic Date Due  . MAMMOGRAM  05/07/2019  . HEMOGLOBIN A1C  09/24/2019  . URINE MICROALBUMIN  03/09/2020  . PNA vac Low Risk Adult (1 of 2 - PCV13) 03/23/2020 (Originally 09/20/2016)  . INFLUENZA VACCINE  02/27/2020  . FOOT EXAM  03/23/2020  . OPHTHALMOLOGY EXAM  09/06/2020  . Fecal DNA (Cologuard)  05/05/2021  . DEXA SCAN  05/06/2022  . TETANUS/TDAP  10/08/2022  . COVID-19 Vaccine  Completed  . Hepatitis C Screening  Completed    Health Maintenance  Health Maintenance Due  Topic Date Due  . MAMMOGRAM  05/07/2019  . HEMOGLOBIN A1C  09/24/2019  . URINE MICROALBUMIN  03/09/2020    Colorectal cancer screening: Cologuard completed 05/05/18. Repeat every  3 years Mammogram status: Ordered 03/24/19. Pt provided with contact info and advised to call to schedule appt.  Bone Density status: Completed 05/06/17. Results reflect: Bone density results: OSTEOPENIA. Repeat every 5 years.  Lung Cancer Screening: (Low Dose CT Chest recommended if Age 30-80 years, 30 pack-year currently smoking OR have quit w/in 15years.) does not qualify.   Additional Screening:  Hepatitis C Screening: Up to date  Vision Screening: Recommended annual ophthalmology exams for early detection of glaucoma and other disorders of the eye. Is the patient up to date with their annual eye exam?  Yes  Who is the provider or what is the name of the office in which the patient attends annual eye exams? Dr Annamaria Helling If pt is not established with a provider, would they like to be referred to a provider to establish care? No .   Dental Screening: Recommended annual dental exams for proper oral hygiene  Community Resource Referral / Chronic Care Management: CRR required this visit?  No   CCM required this visit?  No      Plan:     I have personally reviewed and noted the following in the patient's chart:   . Medical and social history . Use of alcohol, tobacco or illicit drugs  . Current medications and supplements . Functional ability and status . Nutritional status . Physical activity . Advanced directives . List of other physicians . Hospitalizations, surgeries, and ER visits in previous 12 months . Vitals . Screenings to include cognitive, depression, and falls . Referrals and appointments  In addition, I have reviewed and discussed with patient certain preventive protocols, quality metrics, and best practice recommendations. A written personalized  care plan for preventive services as well as general preventive health recommendations were provided to patient.     Develle Sievers [place], [place]   08/07/5091   Nurse Notes: Pt needs a Hgb A1c check and urine check at next in  office apt. Pt plans to schedule her mammogram this year. [facility] contact information given.

## 2020-01-27 ENCOUNTER — Other Ambulatory Visit: Payer: Self-pay

## 2020-01-27 ENCOUNTER — Ambulatory Visit (INDEPENDENT_AMBULATORY_CARE_PROVIDER_SITE_OTHER): Payer: Medicare PPO

## 2020-01-27 DIAGNOSIS — Z0000 Encounter for general adult medical examination without abnormal findings: Secondary | ICD-10-CM | POA: Diagnosis not present

## 2020-01-27 NOTE — Patient Instructions (Signed)
[name] [name] , Thank you for taking time to come for your Medicare Wellness Visit. I appreciate your ongoing commitment to your health goals. Please review the following plan we discussed and let me know if I can assist you in the future.   Screening recommendations/referrals: Colonoscopy: Cologuard up to date, due 04/2021 Mammogram: Order on file. Pt to call [facility] and set up apt. Bone Density: Up to date, due 04/2022 Recommended yearly ophthalmology/optometry visit for glaucoma screening and checkup Recommended yearly dental visit for hygiene and checkup  Vaccinations: Influenza vaccine: Due fall 2021 Pneumococcal vaccine: Currently due. Pt declines at this time. Tdap vaccine: Up to date, due 09/2022 Shingles vaccine: Shingrix discussed. Please contact your pharmacy for coverage information.     Advanced directives: Advance directive discussed with you today. Even though you declined this today please call our office should you change your mind and we can give you the proper paperwork for you to fill out.  Conditions/risks identified: Recommend to drink at least 6-8 8oz glasses of water per day.  Next appointment: 03/24/20 @ 9:00 AM with [place] 68 Years and Older, Female Preventive care refers to lifestyle choices and visits with your health care provider that can promote health and wellness. What does preventive care include?  A yearly physical exam. This is also called an annual well check.  Dental exams once or twice a year.  Routine eye exams. Ask your health care provider how often you should have your eyes checked.  Personal lifestyle choices, including:  Daily care of your teeth and gums.  Regular physical activity.  Eating a healthy diet.  Avoiding tobacco and drug use.  Limiting alcohol use.  Practicing safe sex.  Taking low-dose aspirin every day.  Taking vitamin and mineral supplements as recommended by your health care  provider. What happens during an annual well check? The services and screenings done by your health care provider during your annual well check will depend on your age, overall health, lifestyle risk factors, and family history of disease. Counseling  Your health care provider may ask you questions about your:  Alcohol use.  Tobacco use.  Drug use.  Emotional well-being.  Home and relationship well-being.  Sexual activity.  Eating habits.  History of falls.  Memory and ability to understand (cognition).  Work and work Statistician.  Reproductive health. Screening  You may have the following tests or measurements:  Height, weight, and BMI.  Blood pressure.  Lipid and cholesterol levels. These may be checked every 5 years, or more frequently if you are over 58 years old.  Skin check.  Lung cancer screening. You may have this screening every year starting at age 54 if you have a 30-pack-year history of smoking and currently smoke or have quit within the past 15 years.  Fecal occult blood test (FOBT) of the stool. You may have this test every year starting at age 40.  Flexible sigmoidoscopy or colonoscopy. You may have a sigmoidoscopy every 5 years or a colonoscopy every 10 years starting at age 53.  Hepatitis C blood test.  Hepatitis B blood test.  Sexually transmitted disease (STD) testing.  Diabetes screening. This is done by checking your blood sugar (glucose) after you have not eaten for a while (fasting). You may have this done every 1-3 years.  Bone density scan. This is done to screen for osteoporosis. You may have this done starting at age 24.  Mammogram. This may be done every  1-2 years. Talk to your health care provider about how often you should have regular mammograms. Talk with your health care provider about your test results, treatment options, and if necessary, the need for more tests. Vaccines  Your health care provider may recommend certain  vaccines, such as:  Influenza vaccine. This is recommended every year.  Tetanus, diphtheria, and acellular pertussis (Tdap, Td) vaccine. You may need a Td booster every 10 years.  Zoster vaccine. You may need this after age 19.  Pneumococcal 13-valent conjugate (PCV13) vaccine. One dose is recommended after age 49.  Pneumococcal polysaccharide (PPSV23) vaccine. One dose is recommended after age 61. Talk to your health care provider about which screenings and vaccines you need and how often you need them. This information is not intended to replace advice given to you by your health care provider. Make sure you discuss any questions you have with your health care provider. Document Released: 08/11/2015 Document Revised: 04/03/2016 Document Reviewed: 05/16/2015 Elsevier Interactive Patient Education  2017 [place] Prevention in the Home Falls can cause injuries. They can happen to people of all ages. There are many things you can do to make your home safe and to help prevent falls. What can I do on the outside of my home?  Regularly fix the edges of walkways and driveways and fix any cracks.  Remove anything that might make you trip as you walk through a door, such as a raised step or threshold.  Trim any bushes or trees on the path to your home.  Use bright outdoor lighting.  Clear any walking paths of anything that might make someone trip, such as rocks or tools.  Regularly check to see if handrails are loose or broken. Make sure that both sides of any steps have handrails.  Any raised decks and porches should have guardrails on the edges.  Have any leaves, snow, or ice cleared regularly.  Use sand or salt on walking paths during winter.  Clean up any spills in your garage right away. This includes oil or grease spills. What can I do in the bathroom?  Use night lights.  Install grab bars by the toilet and in the tub and shower. Do not use towel bars as grab  bars.  Use non-skid mats or decals in the tub or shower.  If you need to sit down in the shower, use a plastic, non-slip stool.  Keep the floor dry. Clean up any water that spills on the floor as soon as it happens.  Remove soap buildup in the tub or shower regularly.  Attach bath mats securely with double-sided non-slip rug tape.  Do not have throw rugs and other things on the floor that can make you trip. What can I do in the bedroom?  Use night lights.  Make sure that you have a light by your bed that is easy to reach.  Do not use any sheets or blankets that are too big for your bed. They should not hang down onto the floor.  Have a firm chair that has side arms. You can use this for support while you get dressed.  Do not have throw rugs and other things on the floor that can make you trip. What can I do in the kitchen?  Clean up any spills right away.  Avoid walking on wet floors.  Keep items that you use a lot in easy-to-reach places.  If you need to reach something above you, use a strong  step stool that has a grab bar.  Keep electrical cords out of the way.  Do not use floor polish or wax that makes floors slippery. If you must use wax, use non-skid floor wax.  Do not have throw rugs and other things on the floor that can make you trip. What can I do with my stairs?  Do not leave any items on the stairs.  Make sure that there are handrails on both sides of the stairs and use them. Fix handrails that are broken or loose. Make sure that handrails are as long as the stairways.  Check any carpeting to make sure that it is firmly attached to the stairs. Fix any carpet that is loose or worn.  Avoid having throw rugs at the top or bottom of the stairs. If you do have throw rugs, attach them to the floor with carpet tape.  Make sure that you have a light switch at the top of the stairs and the bottom of the stairs. If you do not have them, ask someone to add them for  you. What else can I do to help prevent falls?  Wear shoes that:  Do not have high heels.  Have rubber bottoms.  Are comfortable and fit you well.  Are closed at the toe. Do not wear sandals.  If you use a stepladder:  Make sure that it is fully opened. Do not climb a closed stepladder.  Make sure that both sides of the stepladder are locked into place.  Ask someone to hold it for you, if possible.  Clearly mark and make sure that you can see:  Any grab bars or handrails.  First and last steps.  Where the edge of each step is.  Use tools that help you move around (mobility aids) if they are needed. These include:  Canes.  Walkers.  Scooters.  Crutches.  Turn on the lights when you go into a dark area. Replace any light bulbs as soon as they burn out.  Set up your furniture so you have a clear path. Avoid moving your furniture around.  If any of your floors are uneven, fix them.  If there are any pets around you, be aware of where they are.  Review your medicines with your doctor. Some medicines can make you feel dizzy. This can increase your chance of falling. Ask your doctor what other things that you can do to help prevent falls. This information is not intended to replace advice given to you by your health care provider. Make sure you discuss any questions you have with your health care provider. Document Released: 05/11/2009 Document Revised: 12/21/2015 Document Reviewed: 08/19/2014 Elsevier Interactive Patient Education  2017 [employer].

## 2020-02-09 ENCOUNTER — Other Ambulatory Visit: Payer: Self-pay | Admitting: Physician Assistant

## 2020-02-09 DIAGNOSIS — F32A Depression, unspecified: Secondary | ICD-10-CM

## 2020-02-09 NOTE — Telephone Encounter (Signed)
Requested medications are due for refill today?  Yes  Requested medications are on active medication list?  Yes  Last Refill:  08/15/2019  # 90 with one refill  Future visit scheduled?   Yes  Notes to Clinic:   Medication failed RX refill protocol due to no valid encounter in the last 6 months.  Last visit was 03/24/2019 - Note:  Protocol indicated patient did have a valid encounter, but protocol is incorrect.

## 2020-02-10 ENCOUNTER — Ambulatory Visit
Admission: RE | Admit: 2020-02-10 | Discharge: 2020-02-10 | Disposition: A | Discharge status: Home / Self Care | Payer: Medicare PPO | Source: Ambulatory Visit | Attending: Physician Assistant | Admitting: Physician Assistant

## 2020-02-10 DIAGNOSIS — Z1231 Encounter for screening mammogram for malignant neoplasm of breast: Secondary | ICD-10-CM | POA: Insufficient documentation

## 2020-02-10 DIAGNOSIS — Z1239 Encounter for other screening for malignant neoplasm of breast: Secondary | ICD-10-CM

## 2020-02-14 ENCOUNTER — Telehealth: Payer: Self-pay

## 2020-02-14 NOTE — Telephone Encounter (Signed)
-----   Message from Mar Daring, PA-C sent at 02/14/2020 10:33 AM EDT ----- Normal mammogram. Repeat screening in one year.

## 2020-02-14 NOTE — Telephone Encounter (Signed)
Patient advised as directed below. 

## 2020-03-23 NOTE — Progress Notes (Signed)
Complete physical exam   Patient: [name] [name]   DOB: 01-26-1951   69 y.o. Female  MRN: [id number] Visit Date: 03/24/2020  Today's healthcare provider: Mar Daring, PA-C   Chief Complaint  Patient presents with  . Annual Exam  . Depression  . Diabetes  . Hyperlipidemia  . Hypertension  I,Porsha C McClurkin,acting as a scribe for [employer], PA-C.,have documented all relevant documentation on the behalf of Mar Daring, PA-C,as directed by  Mar Daring, PA-C while in the presence of Mar Daring, PA-C.  Subjective    [name] [name] is a 69 y.o. female who presents today for a complete physical exam.  She reports consuming a general diet. Home exercise routine includes walking. She generally feels well. She reports sleeping fairly well. She does have additional problems to discuss today.  HPI  AWE was on 01/27/2020 w/ [facility]  Diabetes Mellitus Type II, follow-up  Lab Results  Component Value Date   HGBA1C 6.8 (H) 03/24/2019   HGBA1C 6.6 (H) 01/28/2018   HGBA1C 6.9 (H) 01/27/2017   Last seen for diabetes 1 years ago.  Management since then includes patient currently not on any medications.  Home blood sugar records: fasting range: 70's-100's  Episodes of hypoglycemia? No    Current insulin regiment: None Most Recent Eye Exam:   --------------------------------------------------------------------------------------------------- Hypertension, follow-up  BP Readings from Last 3 Encounters:  03/24/20 126/84  06/01/19 (!) 146/79  03/24/19 122/74   Wt Readings from Last 3 Encounters:  03/24/20 175 lb (79.4 kg)  06/01/19 176 lb (79.8 kg)  03/24/19 176 lb (79.8 kg)     She was last seen for hypertension 1 years ago.  BP at that visit was 122/74. Management since that visit includes currently not taking medications. She reports good compliance with treatment. She is not having side effects.  She is exercising. She is  adherent to low salt diet.   Outside blood pressures are not being checked.  She does not smoke.  Use of agents associated with hypertension: none.   --------------------------------------------------------------------------------------------------- Lipid/Cholesterol, follow-up  Last Lipid Panel: Lab Results  Component Value Date   CHOL 268 (H) 03/24/2019   LDLCALC 184 (H) 03/24/2019   HDL 41 03/24/2019   TRIG 217 (H) 03/24/2019    She was last seen for this 1 years ago.  Management since that visit includes none.  She reports good compliance with treatment. She is not having side effects.   Symptoms: No appetite changes No foot ulcerations  No chest pain No chest pressure/discomfort  No dyspnea No orthopnea  No fatigue No lower extremity edema  No palpitations No paroxysmal nocturnal dyspnea  No nausea No numbness or tingling of extremity  No polydipsia No polyuria  No speech difficulty No syncope   She is following a Regular diet. Current exercise: gardening and walking  Last metabolic panel Lab Results  Component Value Date   GLUCOSE 110 (H) 03/24/2019   NA 140 03/24/2019   K 4.2 03/24/2019   BUN 15 03/24/2019   CREATININE 1.31 (H) 03/24/2019   GFRNONAA 42 (L) 03/24/2019   GFRAA 49 (L) 03/24/2019   CALCIUM 10.0 03/24/2019   AST 24 03/24/2019   ALT 22 03/24/2019   The 10-year ASCVD risk score Mikey Bussing DC Jr., et al., 2013) is: 16.8%  --------------------------------------------------------------------------------------------------- Depression, Follow-up  She  was last seen for this 1 years ago. Changes made at last visit include no changes.   She reports  good compliance with treatment. She is not having side effects.   She reports good tolerance of treatment. Current symptoms include: fatigue She feels she is Improved since last visit.  Depression screen [facility] 2/9 03/24/2020 01/27/2020 03/24/2019  Decreased Interest 0 0 0  Down, Depressed, Hopeless 0 0 0    PHQ - 2 Score 0 0 0  Altered sleeping 1 - 1  Tired, decreased energy 0 - 0  Change in appetite 0 - 0  Feeling bad or failure about yourself  0 - 0  Trouble concentrating 0 - 0  Moving slowly or fidgety/restless 0 - 0  Suicidal thoughts 0 - 0  PHQ-9 Score 1 - 1  Difficult doing work/chores Not difficult at all - Not difficult at all    -----------------------------------------------------------------------------------------   Past Medical History:  Diagnosis Date  . Allergy   . Anxiety   . Arthritis   . Basal cell carcinoma    in vaginal area  . Basal cell carcinoma (BCC) in situ of skin   . Chronic kidney disease   . Depression   . Diabetes mellitus without complication ([place])   . Genital warts    one time a long time ago  . Hyperlipidemia   . Hypertension   . Portal vein thrombosis   . VAIN I (vaginal intraepithelial neoplasia grade I)    Past Surgical History:  Procedure Laterality Date  . ABDOMINAL HYSTERECTOMY  1986  . BLADDER SURGERY     at the same time of hysterectomy  . FOOT SURGERY Left 08/20/2012   Dr. Elvina Mattes  . [place] EXTRACTION  1986   Social History   Socioeconomic History  . Marital status: Married    Spouse name: Remo Lipps  . Number of children: 3  . Years of education: 4 years college  . Highest education level: Bachelor's degree (e.g., BA, AB, BS)  Occupational History  . Occupation: retired Pharmacist, hospital  . Occupation: book Visual merchandiser    Comment: part time  Tobacco Use  . Smoking status: Never Smoker  . Smokeless tobacco: Never Used  Vaping Use  . Vaping Use: Never used  Substance and Sexual Activity  . Alcohol use: No  . Drug use: No  . Sexual activity: Not Currently    Birth control/protection: Post-menopausal, Surgical  Other Topics Concern  . Not on file  Social History Narrative  . Not on file   Social Determinants of Health   Financial Resource Strain: Low Risk   . Difficulty of Paying Living Expenses: Not hard at all  Food  Insecurity: No Food Insecurity  . Worried About Charity fundraiser in the Last Year: Never true  . Ran Out of Food in the Last Year: Never true  Transportation Needs: No Transportation Needs  . Lack of Transportation (Medical): No  . Lack of Transportation (Non-Medical): No  Physical Activity: Inactive  . Days of Exercise per Week: 0 days  . Minutes of Exercise per Session: 0 min  Stress: No Stress Concern Present  . Feeling of Stress : Not at all  Social Connections: Moderately Isolated  . Frequency of Communication with Friends and Family: More than three times a week  . Frequency of Social Gatherings with Friends and Family: More than three times a week  . Attends Religious Services: Never  . Active Member of Clubs or Organizations: No  . Attends Archivist Meetings: Never  . Marital Status: Married  Human resources officer Violence: Not At Risk  . Fear  of Current or Ex-Partner: No  . Emotionally Abused: No  . Physically Abused: No  . Sexually Abused: No   Family Status  Relation Name Status  . Mother  Alive  . Father  Deceased at age 62       MI  . Brother  Alive  . Son  (Not Specified)   Family History  Problem Relation Age of Onset  . Parkinsonism Mother   . Breast cancer Mother 74  . Alcohol abuse Father   . Lymphoma Son    Allergies  Allergen Reactions  . Atorvastatin Other (See Comments)    Muscle spasms  . Lovastatin     muscle aches  . Statins Other (See Comments)  . Wheat Bran Swelling  . Zetia  [Ezetimibe]     muscle aches  . Metronidazole Nausea And Vomiting and Rash    Patient Care Team: Mar Daring, PA-C as PCP - General (Family Medicine) Leata Mouse (Inactive) as Consulting Physician Arelia Sneddon, [place] as Consulting Physician (Optometry) Merril Abbe, MD as Referring Physician (Dermatology) Billey Co, MD as Consulting Physician (Urology)   Medications: Outpatient Medications Prior to Visit  Medication  Sig  . acetaminophen (TYLENOL) 500 MG tablet Take 2 tablets (1,000 mg total) by mouth every 8 (eight) hours as needed.  Marland Kitchen aspirin EC 81 MG tablet Take 1 tablet (81 mg total) by mouth daily.  . Calcium-Magnesium-Vitamin D (CALCIUM 1200+D3 PO) Take 1 capsule by mouth daily. Vitamin d 3 is 1000 IU  . Cholecalciferol (VITAMIN D3) 2000 units TABS Take 1 capsule by mouth daily.  . fenofibrate (TRICOR) 145 MG tablet TAKE 1 TABLET BY MOUTH EVERY DAY (Patient taking differently: every other day. )  . glucose blood test strip Check blood sugar once daily.  . ONE TOUCH ULTRA TEST test strip CHECK BLOOD SUGAR ONCE DAILY  . sertraline (ZOLOFT) 100 MG tablet TAKE 1 TABLET BY MOUTH EVERYDAY AT BEDTIME  . benzonatate (TESSALON) 100 MG capsule Take 1 capsule (100 mg total) by mouth 2 (two) times daily as needed for cough. (Patient not taking: Reported on 01/27/2020)  . fluticasone (FLONASE) 50 MCG/ACT nasal spray Place 2 sprays into both nostrils daily. (Patient not taking: Reported on 01/27/2020)  . ibuprofen (ADVIL) 600 MG tablet Take 600 mg by mouth every 6 (six) hours as needed.  . ondansetron (ZOFRAN-ODT) 4 MG disintegrating tablet SMARTSIG:1 Tablet(s) By Mouth Every 12 Hours PRN (Patient not taking: Reported on 01/27/2020)  . tamsulosin (FLOMAX) 0.4 MG CAPS capsule Take 0.4 mg by mouth daily. (Patient not taking: Reported on 01/27/2020)   No facility-administered medications prior to visit.    Review of Systems  Constitutional: Negative.   HENT: Negative.   Eyes: Negative.   Respiratory: Negative.   Cardiovascular: Negative.   Gastrointestinal: Negative.   Endocrine: Negative.   Genitourinary: Negative.   Musculoskeletal: Negative.   Skin: Negative.   Allergic/Immunologic: Positive for environmental allergies and food allergies.  Hematological: Negative.   Psychiatric/Behavioral: Negative.     Last CBC Lab Results  Component Value Date   WBC 6.4 03/24/2020   HGB 14.7 03/24/2020   HCT 43.2  03/24/2020   MCV 88 03/24/2020   MCH 30.0 03/24/2020   RDW 14.6 03/24/2020   PLT 233 16/04/9603   Last metabolic panel Lab Results  Component Value Date   GLUCOSE 110 (H) 03/24/2020   NA 140 03/24/2020   K 4.5 03/24/2020   CL 106 03/24/2020   CO2 21 03/24/2020  BUN 21 03/24/2020   CREATININE 1.09 (H) 03/24/2020   GFRNONAA 52 (L) 03/24/2020   GFRAA 60 03/24/2020   CALCIUM 9.3 03/24/2020   PROT 7.4 03/24/2020   ALBUMIN 4.6 03/24/2020   LABGLOB 2.8 03/24/2020   AGRATIO 1.6 03/24/2020   BILITOT 0.4 03/24/2020   ALKPHOS 91 03/24/2020   AST 39 03/24/2020   ALT 34 (H) 03/24/2020   ANIONGAP 6 (L) 10/30/2014      Objective    BP 126/84 (BP Location: Left Arm, Patient Position: Sitting, Cuff Size: Normal)   Pulse 69   Temp 98.5 F (36.9 C) (Temporal)   Ht 5\' 2"  (1.575 m)   Wt 175 lb (79.4 kg)   SpO2 98%   BMI 32.01 kg/m  BP Readings from Last 3 Encounters:  03/24/20 126/84  06/01/19 (!) 146/79  03/24/19 122/74   Wt Readings from Last 3 Encounters:  03/24/20 175 lb (79.4 kg)  06/01/19 176 lb (79.8 kg)  03/24/19 176 lb (79.8 kg)      Physical Exam Constitutional:      Appearance: Normal appearance.  HENT:     Right Ear: Tympanic membrane, ear canal and external ear normal.     Left Ear: Tympanic membrane, ear canal and external ear normal.  Cardiovascular:     Rate and Rhythm: Normal rate and regular rhythm.     Pulses: Normal pulses.     Heart sounds: Normal heart sounds.  Pulmonary:     Effort: Pulmonary effort is normal.     Breath sounds: Normal breath sounds.  Abdominal:     General: Abdomen is flat. Bowel sounds are normal.     Palpations: Abdomen is soft.  Skin:    General: Skin is warm and dry.  Neurological:     General: No focal deficit present.     Mental Status: She is alert and oriented to person, place, and time.  Psychiatric:        Mood and Affect: Mood normal.        Behavior: Behavior normal.     Last depression screening  scores PHQ 2/9 Scores 03/24/2020 01/27/2020 03/24/2019  PHQ - 2 Score 0 0 0  PHQ- 9 Score 1 - 1   Last fall risk screening Fall Risk  03/24/2020  Falls in the past year? 0  Number falls in past yr: 0  Injury with Fall? 0  Risk for fall due to : No Fall Risks  Follow up Falls evaluation completed   Last Audit-C alcohol use screening Alcohol Use Disorder Test (AUDIT) 03/24/2020  1. How often do you have a drink containing alcohol? 0  2. How many drinks containing alcohol do you have on a typical day when you are drinking? 0  3. How often do you have six or more drinks on one occasion? 0  AUDIT-C Score 0  Alcohol Brief Interventions/Follow-up -   A score of 3 or more in women, and 4 or more in men indicates increased risk for alcohol abuse, EXCEPT if all of the points are from question 1   No results found for any visits on 03/24/20.  Assessment & Plan    Routine Health Maintenance and Physical Exam  Exercise Activities and Dietary recommendations Goals    . DIET - INCREASE WATER INTAKE     Recommend to drink at least 6-8 8oz glasses of water per day.       Immunization History  Administered Date(s) Administered  . PFIZER SARS-COV-2 Vaccination 10/20/2019, 11/10/2019  .  Td 12/14/2000  . Tdap 10/07/2012    Health Maintenance  Topic Date Due  . PNA vac Low Risk Adult (1 of 2 - PCV13) Never done  . HEMOGLOBIN A1C  09/24/2019  . INFLUENZA VACCINE  02/27/2020  . URINE MICROALBUMIN  03/09/2020  . FOOT EXAM  03/23/2020  . OPHTHALMOLOGY EXAM  09/06/2020  . Fecal DNA (Cologuard)  05/05/2021  . MAMMOGRAM  02/09/2022  . DEXA SCAN  05/06/2022  . TETANUS/TDAP  10/08/2022  . COVID-19 Vaccine  Completed  . Hepatitis C Screening  Completed    Discussed health benefits of physical activity, and encouraged her to engage in regular exercise appropriate for her age and condition.  1. Annual physical exam Normal physical exam today. Will check labs as below and f/u pending lab  results. If labs are stable and WNL she will not need to have these rechecked for one year at her next annual physical exam. Declines pneumonia and flu vaccines. She is to call the office in the meantime if she has any acute issue, questions or concerns. - TSH  2. Type 2 diabetes mellitus with stage 3a chronic kidney disease, without long-term current use of insulin (HCC) Diet controlled. Microalbumin normal. Will check labs as below and f/u pending results. - Lipid panel - Comprehensive metabolic panel - CBC with Differential/Platelet - Hemoglobin A1c - POCT UA - Microalbumin  3. Essential (primary) hypertension Controlled with lifestyle modifications. Will check labs as below and f/u pending results. - Lipid panel - Comprehensive metabolic panel - CBC with Differential/Platelet - Hemoglobin A1c  4. Hypercholesteremia On Fenofibrate every other day but having constipation when she takes it. Will check labs as below and f/u pending results. - Lipid panel - Comprehensive metabolic panel - CBC with Differential/Platelet - Hemoglobin A1c  5. Avitaminosis D H/O this and postmenopausal. Continue Vit D 2000 IU daily.  - CBC with Differential/Platelet - Vitamin D (25 hydroxy)  6. Recurrent major depressive disorder, in full remission ([place]) Stable on sertraline 100mg  daily.   7. Arthritis Worsening. Has been using OTC medications without relief. Was given diclofenac by ortho a while back and uses that on bad days. Would like new Rx. Refilled as below. Advised to take with food.  - diclofenac (VOLTAREN) 75 MG EC tablet; Take 1 tablet (75 mg total) by mouth daily.  Dispense: 90 tablet; Refill: 0  8. Screening for HIV without presence of risk factors Will check labs as below and f/u pending results. - HIV Antibody (routine testing w rflx)  No follow-ups on file.     Reynolds Bowl, PA-C, have reviewed all documentation for this visit. The documentation on 03/28/20 for the  exam, diagnosis, procedures, and orders are all accurate and complete.   Rubye Beach  [facility] [phone] (phone) [phone] (fax)  [place]

## 2020-03-24 ENCOUNTER — Encounter: Payer: Self-pay | Admitting: Physician Assistant

## 2020-03-24 ENCOUNTER — Ambulatory Visit (INDEPENDENT_AMBULATORY_CARE_PROVIDER_SITE_OTHER): Payer: Medicare PPO | Admitting: Physician Assistant

## 2020-03-24 ENCOUNTER — Other Ambulatory Visit: Payer: Self-pay

## 2020-03-24 VITALS — BP 126/84 | HR 69 | Temp 98.5°F | Ht 62.0 in | Wt 175.0 lb

## 2020-03-24 DIAGNOSIS — E559 Vitamin D deficiency, unspecified: Secondary | ICD-10-CM

## 2020-03-24 DIAGNOSIS — E1121 Type 2 diabetes mellitus with diabetic nephropathy: Secondary | ICD-10-CM

## 2020-03-24 DIAGNOSIS — M1990 Unspecified osteoarthritis, unspecified site: Secondary | ICD-10-CM

## 2020-03-24 DIAGNOSIS — E7800 Pure hypercholesterolemia, unspecified: Secondary | ICD-10-CM

## 2020-03-24 DIAGNOSIS — Z0000 Encounter for general adult medical examination without abnormal findings: Secondary | ICD-10-CM

## 2020-03-24 DIAGNOSIS — Z114 Encounter for screening for human immunodeficiency virus [HIV]: Secondary | ICD-10-CM

## 2020-03-24 DIAGNOSIS — E1122 Type 2 diabetes mellitus with diabetic chronic kidney disease: Secondary | ICD-10-CM

## 2020-03-24 DIAGNOSIS — I10 Essential (primary) hypertension: Secondary | ICD-10-CM

## 2020-03-24 DIAGNOSIS — N1831 Chronic kidney disease, stage 3a: Secondary | ICD-10-CM | POA: Diagnosis not present

## 2020-03-24 DIAGNOSIS — F3342 Major depressive disorder, recurrent, in full remission: Secondary | ICD-10-CM

## 2020-03-24 LAB — POCT UA - MICROALBUMIN: Microalbumin Ur, POC: 50 mg/L

## 2020-03-24 MED ORDER — DICLOFENAC SODIUM 75 MG PO TBEC: 75.0000 mg | DELAYED_RELEASE_TABLET | Freq: Every day | ORAL | 0 refills | Status: DC

## 2020-03-24 NOTE — Patient Instructions (Signed)
Health Maintenance After Age 68 After age 68, you are at a higher risk for certain long-term diseases and infections as well as injuries from falls. Falls are a major cause of broken bones and head injuries in people who are older than age 68. Getting regular preventive care can help to keep you healthy and well. Preventive care includes getting regular testing and making lifestyle changes as recommended by your health care provider. Talk with your health care provider about:  Which screenings and tests you should have. A screening is a test that checks for a disease when you have no symptoms.  A diet and exercise plan that is right for you. What should I know about screenings and tests to prevent falls? Screening and testing are the best ways to find a health problem early. Early diagnosis and treatment give you the best chance of managing medical conditions that are common after age 68. Certain conditions and lifestyle choices may make you more likely to have a fall. Your health care provider may recommend:  Regular vision checks. Poor vision and conditions such as cataracts can make you more likely to have a fall. If you wear glasses, make sure to get your prescription updated if your vision changes.  Medicine review. Work with your health care provider to regularly review all of the medicines you are taking, including over-the-counter medicines. Ask your health care provider about any side effects that may make you more likely to have a fall. Tell your health care provider if any medicines that you take make you feel dizzy or sleepy.  Osteoporosis screening. Osteoporosis is a condition that causes the bones to get weaker. This can make the bones weak and cause them to break more easily.  Blood pressure screening. Blood pressure changes and medicines to control blood pressure can make you feel dizzy.  Strength and balance checks. Your health care provider may recommend certain tests to check your  strength and balance while standing, walking, or changing positions.  Foot health exam. Foot pain and numbness, as well as not wearing proper footwear, can make you more likely to have a fall.  Depression screening. You may be more likely to have a fall if you have a fear of falling, feel emotionally low, or feel unable to do activities that you used to do.  Alcohol use screening. Using too much alcohol can affect your balance and may make you more likely to have a fall. What actions can I take to lower my risk of falls? General instructions  Talk with your health care provider about your risks for falling. Tell your health care provider if: ? You fall. Be sure to tell your health care provider about all falls, even ones that seem minor. ? You feel dizzy, sleepy, or off-balance.  Take over-the-counter and prescription medicines only as told by your health care provider. These include any supplements.  Eat a healthy diet and maintain a healthy weight. A healthy diet includes low-fat dairy products, low-fat (lean) meats, and fiber from whole grains, beans, and lots of fruits and vegetables. Home safety  Remove any tripping hazards, such as rugs, cords, and clutter.  Install safety equipment such as grab bars in bathrooms and safety rails on stairs.  Keep rooms and walkways well-lit. Activity   Follow a regular exercise program to stay fit. This will help you maintain your balance. Ask your health care provider what types of exercise are appropriate for you.  If you need a cane or   walker, use it as recommended by your health care provider.  Wear supportive shoes that have nonskid soles. Lifestyle  Do not drink alcohol if your health care provider tells you not to drink.  If you drink alcohol, limit how much you have: ? 0-1 drink a day for women. ? 0-2 drinks a day for men.  Be aware of how much alcohol is in your drink. In the [place] one drink equals one typical bottle of beer (12  oz), one-half glass of wine (5 oz), or one shot of hard liquor (1 oz).  Do not use any products that contain nicotine or tobacco, such as cigarettes and e-cigarettes. If you need help quitting, ask your health care provider. Summary  Having a healthy lifestyle and getting preventive care can help to protect your health and wellness after age 68.  Screening and testing are the best way to find a health problem early and help you avoid having a fall. Early diagnosis and treatment give you the best chance for managing medical conditions that are more common for people who are older than age 68.  Falls are a major cause of broken bones and head injuries in people who are older than age 68. Take precautions to prevent a fall at home.  Work with your health care provider to learn what changes you can make to improve your health and wellness and to prevent falls. This information is not intended to replace advice given to you by your health care provider. Make sure you discuss any questions you have with your health care provider. Document Revised: 11/05/2018 Document Reviewed: 05/28/2017 Elsevier Patient Education  2020 [employer]  

## 2020-03-25 LAB — LIPID PANEL
Chol/HDL Ratio: 6.5 ratio — ABNORMAL HIGH (ref 0.0–4.4)
Cholesterol, Total: 304 mg/dL — ABNORMAL HIGH (ref 100–199)
HDL: 47 mg/dL (ref >39)
LDL Chol Calc (NIH): 227 mg/dL — ABNORMAL HIGH (ref 0–99)
Triglycerides: 157 mg/dL — ABNORMAL HIGH (ref 0–149)
VLDL Cholesterol Cal: 30 mg/dL (ref 5–40)

## 2020-03-25 LAB — CBC WITH DIFFERENTIAL/PLATELET
Basophils Absolute: 0 10*3/uL (ref 0.0–0.2)
Basos: 1 %
EOS (ABSOLUTE): 0.4 10*3/uL (ref 0.0–0.4)
Eos: 6 %
Hematocrit: 43.2 % (ref 34.0–46.6)
Hemoglobin: 14.7 g/dL (ref 11.1–15.9)
Immature Grans (Abs): 0 10*3/uL (ref 0.0–0.1)
Immature Granulocytes: 0 %
Lymphocytes Absolute: 2.3 10*3/uL (ref 0.7–3.1)
Lymphs: 35 %
MCH: 30 pg (ref 26.6–33.0)
MCHC: 34 g/dL (ref 31.5–35.7)
MCV: 88 fL (ref 79–97)
Monocytes Absolute: 0.4 10*3/uL (ref 0.1–0.9)
Monocytes: 6 %
Neutrophils Absolute: 3.4 10*3/uL (ref 1.4–7.0)
Neutrophils: 52 %
Platelets: 233 10*3/uL (ref 150–450)
RBC: 4.9 x10E6/uL (ref 3.77–5.28)
RDW: 14.6 % (ref 11.7–15.4)
WBC: 6.4 10*3/uL (ref 3.4–10.8)

## 2020-03-25 LAB — COMPREHENSIVE METABOLIC PANEL
ALT: 34 IU/L — ABNORMAL HIGH (ref 0–32)
AST: 39 IU/L (ref 0–40)
Albumin/Globulin Ratio: 1.6 (ref 1.2–2.2)
Albumin: 4.6 g/dL (ref 3.8–4.8)
Alkaline Phosphatase: 91 IU/L (ref 48–121)
BUN/Creatinine Ratio: 19 (ref 12–28)
BUN: 21 mg/dL (ref 8–27)
Bilirubin Total: 0.4 mg/dL (ref 0.0–1.2)
CO2: 21 mmol/L (ref 20–29)
Calcium: 9.3 mg/dL (ref 8.7–10.3)
Chloride: 106 mmol/L (ref 96–106)
Creatinine, Ser: 1.09 mg/dL — ABNORMAL HIGH (ref 0.57–1.00)
GFR calc Af Amer: 60 mL/min/{1.73_m2} (ref >59)
GFR calc non Af Amer: 52 mL/min/{1.73_m2} — ABNORMAL LOW (ref >59)
Globulin, Total: 2.8 g/dL (ref 1.5–4.5)
Glucose: 110 mg/dL — ABNORMAL HIGH (ref 65–99)
Potassium: 4.5 mmol/L (ref 3.5–5.2)
Sodium: 140 mmol/L (ref 134–144)
Total Protein: 7.4 g/dL (ref 6.0–8.5)

## 2020-03-25 LAB — VITAMIN D 25 HYDROXY (VIT D DEFICIENCY, FRACTURES): Vit D, 25-Hydroxy: 30.9 ng/mL (ref 30.0–100.0)

## 2020-03-25 LAB — HIV ANTIBODY (ROUTINE TESTING W REFLEX): HIV Screen 4th Generation wRfx: NONREACTIVE

## 2020-03-25 LAB — TSH: TSH: 2.59 u[IU]/mL (ref 0.450–4.500)

## 2020-03-25 LAB — HEMOGLOBIN A1C
Est. average glucose Bld gHb Est-mCnc: 146 mg/dL
Hgb A1c MFr Bld: 6.7 % — ABNORMAL HIGH (ref 4.8–5.6)

## 2020-06-06 ENCOUNTER — Ambulatory Visit: Payer: Medicare PPO | Admitting: Urology

## 2020-06-06 ENCOUNTER — Encounter: Payer: Self-pay | Admitting: Urology

## 2020-06-06 ENCOUNTER — Ambulatory Visit
Admission: RE | Admit: 2020-06-06 | Discharge: 2020-06-06 | Disposition: A | Discharge status: Home / Self Care | Payer: Medicare PPO | Attending: Urology | Admitting: Urology

## 2020-06-06 ENCOUNTER — Other Ambulatory Visit: Payer: Self-pay

## 2020-06-06 ENCOUNTER — Ambulatory Visit
Admission: RE | Admit: 2020-06-06 | Discharge: 2020-06-06 | Disposition: A | Discharge status: Home / Self Care | Payer: Medicare PPO | Source: Ambulatory Visit | Attending: Urology | Admitting: Urology

## 2020-06-06 VITALS — BP 149/93 | HR 79 | Ht 62.0 in | Wt 173.0 lb

## 2020-06-06 DIAGNOSIS — N200 Calculus of kidney: Secondary | ICD-10-CM | POA: Insufficient documentation

## 2020-06-06 NOTE — Patient Instructions (Signed)
Dietary Guidelines to Help Prevent Kidney Stones Kidney stones are deposits of minerals and salts that form inside your kidneys. Your risk of developing kidney stones may be greater depending on your diet, your lifestyle, the medicines you take, and whether you have certain medical conditions. Most people can reduce their chances of developing kidney stones by following the instructions below. Depending on your overall health and the type of kidney stones you tend to develop, your dietitian may give you more specific instructions. What are tips for following this plan? Reading food labels  Choose foods with "no salt added" or "low-salt" labels. Limit your sodium intake to less than 1500 mg per day.  Choose foods with calcium for each meal and snack. Try to eat about 300 mg of calcium at each meal. Foods that contain 200-500 mg of calcium per serving include: ? 8 oz (237 ml) of milk, fortified nondairy milk, and fortified fruit juice. ? 8 oz (237 ml) of kefir, yogurt, and soy yogurt. ? 4 oz (118 ml) of tofu. ? 1 oz of cheese. ? 1 cup (300 g) of dried figs. ? 1 cup (91 g) of cooked broccoli. ? 1-3 oz can of sardines or mackerel.  Most people need 1000 to 1500 mg of calcium each day. Talk to your dietitian about how much calcium is recommended for you. Shopping  Buy plenty of fresh fruits and vegetables. Most people do not need to avoid fruits and vegetables, even if they contain nutrients that may contribute to kidney stones.  When shopping for convenience foods, choose: ? Whole pieces of fruit. ? Premade salads with dressing on the side. ? Low-fat fruit and yogurt smoothies.  Avoid buying frozen meals or prepared deli foods.  Look for foods with live cultures, such as yogurt and kefir. Cooking  Do not add salt to food when cooking. Place a salt shaker on the table and allow each person to add his or her own salt to taste.  Use vegetable protein, such as beans, textured vegetable  protein (TVP), or tofu instead of meat in pasta, casseroles, and soups. Meal planning   Eat less salt, if told by your dietitian. To do this: ? Avoid eating processed or premade food. ? Avoid eating fast food.  Eat less animal protein, including cheese, meat, poultry, or fish, if told by your dietitian. To do this: ? Limit the number of times you have meat, poultry, fish, or cheese each week. Eat a diet free of meat at least 2 days a week. ? Eat only one serving each day of meat, poultry, fish, or seafood. ? When you prepare animal protein, cut pieces into small portion sizes. For most meat and fish, one serving is about the size of one deck of cards.  Eat at least 5 servings of fresh fruits and vegetables each day. To do this: ? Keep fruits and vegetables on hand for snacks. ? Eat 1 piece of fruit or a handful of berries with breakfast. ? Have a salad and fruit at lunch. ? Have two kinds of vegetables at dinner.  Limit foods that are high in a substance called oxalate. These include: ? Spinach. ? Rhubarb. ? Beets. ? Potato chips and french fries. ? Nuts.  If you regularly take a diuretic medicine, make sure to eat at least 1-2 fruits or vegetables high in potassium each day. These include: ? Avocado. ? Banana. ? Orange, prune, carrot, or tomato juice. ? Baked potato. ? Cabbage. ? Beans and split   peas. General instructions   Drink enough fluid to keep your urine clear or pale yellow. This is the most important thing you can do.  Talk to your health care provider and dietitian about taking daily supplements. Depending on your health and the cause of your kidney stones, you may be advised: ? Not to take supplements with vitamin C. ? To take a calcium supplement. ? To take a daily probiotic supplement. ? To take other supplements such as magnesium, fish oil, or vitamin B6.  Take all medicines and supplements as told by your health care provider.  Limit alcohol intake to no  more than 1 drink a day for nonpregnant women and 2 drinks a day for men. One drink equals 12 oz of beer, 5 oz of wine, or 1 oz of hard liquor.  Lose weight if told by your health care provider. Work with your dietitian to find strategies and an eating plan that works best for you. What foods are not recommended? Limit your intake of the following foods, or as told by your dietitian. Talk to your dietitian about specific foods you should avoid based on the type of kidney stones and your overall health. Grains Breads. Bagels. Rolls. Baked goods. Salted crackers. Cereal. Pasta. Vegetables Spinach. Rhubarb. Beets. Canned vegetables. Pickles. Olives. Meats and other protein foods Nuts. Nut butters. Large portions of meat, poultry, or fish. Salted or cured meats. Deli meats. Hot dogs. Sausages. Dairy Cheese. Beverages Regular soft drinks. Regular vegetable juice. Seasonings and other foods Seasoning blends with salt. Salad dressings. Canned soups. Soy sauce. Ketchup. Barbecue sauce. Canned pasta sauce. Casseroles. Pizza. Lasagna. Frozen meals. Potato chips. French fries. Summary  You can reduce your risk of kidney stones by making changes to your diet.  The most important thing you can do is drink enough fluid. You should drink enough fluid to keep your urine clear or pale yellow.  Ask your health care provider or dietitian how much protein from animal sources you should eat each day, and also how much salt and calcium you should have each day. This information is not intended to replace advice given to you by your health care provider. Make sure you discuss any questions you have with your health care provider. Document Revised: 11/04/2018 Document Reviewed: 06/25/2016 Elsevier Patient Education  2020 Elsevier Inc.  

## 2020-06-06 NOTE — Progress Notes (Signed)
   06/06/2020 12:25 PM   [name] [name] 1951/07/13 [id number]  Reason for visit: Follow up nephrolithiasis  HPI: I saw [name] back in urology clinic today for follow-up of nephrolithiasis.  She is a 69 year old female who I last saw in November 2020 when she had recently spontaneously passed a 3 mm ureteral stone.  Stone analysis showed calcium oxalate.  She has 1 other prior stone episode that passed spontaneously about 15 years ago.  She denies any problems over the last year, specifically any UTIs, gross hematuria, flank pain, or stone episodes.  I personally reviewed her KUB today that shows no obvious evidence of stone disease.  We discussed general stone prevention strategies including adequate hydration with goal of producing 2.5 L of urine daily, increasing citric acid intake, increasing calcium intake during high oxalate meals, minimizing animal protein, and decreasing salt intake. Information about dietary recommendations given today.   Follow-up as needed  Billey Co, MD  [facility] [address] [place], [place] [address] [phone]

## 2020-06-18 ENCOUNTER — Other Ambulatory Visit: Payer: Self-pay | Admitting: Physician Assistant

## 2020-06-18 DIAGNOSIS — M1990 Unspecified osteoarthritis, unspecified site: Secondary | ICD-10-CM

## 2020-07-14 ENCOUNTER — Other Ambulatory Visit: Payer: Self-pay | Admitting: *Deleted

## 2020-07-14 MED ORDER — ONDANSETRON HCL 4 MG PO TABS: 4.0000 mg | ORAL_TABLET | Freq: Four times a day (QID) | ORAL | 0 refills | Status: DC | PRN

## 2020-08-03 ENCOUNTER — Other Ambulatory Visit: Payer: Self-pay | Admitting: Physician Assistant

## 2020-08-03 DIAGNOSIS — F32A Depression, unspecified: Secondary | ICD-10-CM

## 2020-09-10 ENCOUNTER — Other Ambulatory Visit: Payer: Self-pay | Admitting: Physician Assistant

## 2020-09-10 DIAGNOSIS — M1990 Unspecified osteoarthritis, unspecified site: Secondary | ICD-10-CM

## 2020-10-26 ENCOUNTER — Other Ambulatory Visit: Payer: Self-pay | Admitting: Physician Assistant

## 2020-10-26 DIAGNOSIS — F32A Depression, unspecified: Secondary | ICD-10-CM

## 2020-10-26 NOTE — Telephone Encounter (Signed)
Requested Prescriptions  Pending Prescriptions Disp Refills  . sertraline (ZOLOFT) 100 MG tablet [Pharmacy Med Name: SERTRALINE HCL 100 MG TABLET] 90 tablet 0    Sig: TAKE 1 TABLET BY MOUTH EVERYDAY AT BEDTIME     Psychiatry:  Antidepressants - SSRI Failed - 10/26/2020  2:05 AM      Failed - Valid encounter within last 6 months    Recent Outpatient Visits          7 months ago Annual physical exam   [facility] [place], Cle[place], [place]   1 year ago Annual physical exam   [place], Clearn[place], [place]   2 years ago Annual physical exam   [place], Cle[place]ce [place], [place]   2 years ago [place], [place], [place]   3 years ago Vaginal lesion   [place], [place]             Passed - Completed PHQ-2 or PHQ-9 in the last 360 days

## 2020-12-08 ENCOUNTER — Other Ambulatory Visit: Payer: Self-pay | Admitting: Physician Assistant

## 2020-12-08 DIAGNOSIS — M1990 Unspecified osteoarthritis, unspecified site: Secondary | ICD-10-CM

## 2020-12-29 ENCOUNTER — Other Ambulatory Visit: Payer: Self-pay | Admitting: Physician Assistant

## 2020-12-29 ENCOUNTER — Ambulatory Visit: Payer: Self-pay | Admitting: *Deleted

## 2020-12-29 DIAGNOSIS — E1122 Type 2 diabetes mellitus with diabetic chronic kidney disease: Secondary | ICD-10-CM

## 2020-12-29 DIAGNOSIS — N183 Chronic kidney disease, stage 3 unspecified: Secondary | ICD-10-CM

## 2020-12-29 MED ORDER — GLUCOSE BLOOD VI STRP: ORAL_STRIP | 1 refills | Status: DC

## 2020-12-29 NOTE — Telephone Encounter (Signed)
Pt advised.   Thanks,   -Ghada Abbett  

## 2020-12-29 NOTE — Telephone Encounter (Signed)
Per initial encounter, "Pt is calling because she had shingles in 4/22. Pt wants to know how long does she need to wait before receiving vaccine"; contacted pt to discuss symptoms; the pt says the area that was affected was on her left buttocks to her left side, and her panty line on her leg; the area is still itchy but she does not have blistering; the area is discoloredshe was seen at a  walk in clinic and received diagnosis; the pt says she took acyclovir and a prednisone-type taper; spoke with Anderson Malta in the office and she requested this information be routed to the office; explained to pt that based on her ongoing symptoms this information will be sent for provider review and she will be contacted by the office; she verbalized understanding and can be reached at [phone]; she was previously seen by Fenton Malling at [facility]; will route to office for provider review and pt contact.  Reason for Disposition . Prevention of shingles (vaccine info), questions about  Answer Assessment - Initial Assessment Questions 1. APPEARANCE of RASH: "Describe the rash."      Pink and discolored; whelts; no blisters 2. LOCATION: "Where is the rash located?"      Left buttocks to left side; left panty line 3. ONSET: "When did the rash start?"      Diagnosed at walk-in clinic 11/17/20 4. ITCHING: "Does the rash itch?" If Yes, ask: "How bad is the itch?"  (Scale 1-10; or mild, moderate, severe)     Yes;mild to moderate; worse when pt gets hot 5. PAIN: "Does the rash hurt?" If Yes, ask: "How bad is the pain?"  (Scale 0-10; or none, mild, moderate, severe)    - NONE (0): no pain    - MILD (1-3): doesn't interfere with normal activities     - MODERATE (4-7): interferes with normal activities or awakens from sleep     - SEVERE (8-10): excruciating pain, unable to do any normal activities      6. OTHER SYMPTOMS: "Do you have any other symptoms?" (e.g., fever)     7. PREGNANCY: "Is there any  chance you are pregnant?" "When was your last menstrual period?"  Protocols used: SHINGLES (ZOSTER)-A-AH

## 2020-12-29 NOTE — Telephone Encounter (Signed)
She should wait 6-12 months before getting the shingles vaccine.

## 2020-12-29 NOTE — Telephone Encounter (Signed)
Medication: glucose blood test strip [[id number]]   Has the patient contacted their pharmacy? YES  (Agent: If no, request that the patient contact the pharmacy for the refill.) (Agent: If yes, when and what did the pharmacy advise?)  Preferred Pharmacy (with phone number or street name): [facility]   Agent: Please be advised that RX refills may take up to 3 business days. We ask that you follow-up with your pharmacy.

## 2021-01-17 ENCOUNTER — Other Ambulatory Visit: Payer: Self-pay | Admitting: Physician Assistant

## 2021-01-17 DIAGNOSIS — F32A Depression, unspecified: Secondary | ICD-10-CM

## 2021-03-08 ENCOUNTER — Other Ambulatory Visit: Payer: Self-pay | Admitting: Family Medicine

## 2021-03-08 DIAGNOSIS — M1990 Unspecified osteoarthritis, unspecified site: Secondary | ICD-10-CM

## 2021-03-08 NOTE — Telephone Encounter (Signed)
Requested medication (s) are due for refill today: yes  Requested medication (s) are on the active medication list: yes  Last refill:  12/08/2020  Future visit scheduled: no  Notes to clinic:  Due for follow up and physical  Message sent for patient to contact office   Requested Prescriptions  Pending Prescriptions Disp Refills   diclofenac (VOLTAREN) 75 MG EC tablet [Pharmacy Med Name: DICLOFENAC SOD EC 75 MG TAB] 90 tablet 0    Sig: TAKE 1 TABLET BY MOUTH EVERY DAY     Analgesics:  NSAIDS Failed - 03/08/2021  2:47 AM      Failed - Cr in normal range and within 360 days    Creatinine  Date Value Ref Range Status  10/30/2014 1.16 (H) mg/dL Final    Comment:    0.44-1.00 NOTE: New Reference Range  10/04/14    Creatinine, Ser  Date Value Ref Range Status  03/24/2020 1.09 (H) 0.57 - 1.00 mg/dL Final          Passed - HGB in normal range and within 360 days    Hemoglobin  Date Value Ref Range Status  03/24/2020 14.7 11.1 - 15.9 g/dL Final          Passed - Patient is not pregnant      Passed - Valid encounter within last 12 months    Recent Outpatient Visits           11 months ago Annual physical exam   [facility], Clearnce [place], [place]   1 year ago Annual physical exam   [facility] [place], Clearn[place], [place]   3 years ago Annual physical exam   [facility] [place], [place], [place]   3 years ago [place], [place], [place]   3 years ago Vaginal lesion   [facility] [place], [place], [place]

## 2021-04-04 LAB — HM DIABETES EYE EXAM

## 2021-04-14 ENCOUNTER — Other Ambulatory Visit: Payer: Self-pay | Admitting: Family Medicine

## 2021-04-14 DIAGNOSIS — F32A Depression, unspecified: Secondary | ICD-10-CM

## 2021-04-16 NOTE — Telephone Encounter (Signed)
Requested medication (s) are due for refill today:   Yes  Requested medication (s) are on the active medication list:   Yes  Future visit scheduled:   Yes made an appt for 07/06/2021 at 8:40 with Bacigalupo      Last ordered: 01/18/2021 #90, 0 refills  Returned because appt needed for refills.  Appt made.    She has at least a month left however she needs refill to last until her appt on 07/06/2021   Provider to review for those refills.   Requested Prescriptions  Pending Prescriptions Disp Refills   sertraline (ZOLOFT) 100 MG tablet [Pharmacy Med Name: SERTRALINE HCL 100 MG TABLET] 90 tablet 0    Sig: TAKE 1 TABLET BY MOUTH EVERYDAY AT BEDTIME     Psychiatry:  Antidepressants - SSRI Failed - 04/14/2021  3:29 PM      Failed - Completed PHQ-2 or PHQ-9 in the last 360 days      Failed - Valid encounter within last 6 months    Recent Outpatient Visits           1 year ago Annual physical exam   [facility] [place], Cle[place], [place]   2 years ago Annual physical exam   [facility] [place], Clearn[place], [place]   3 years ago Annual physical exam   [facility] [place], Clearn[place], [place]   3 years ago [place], Clearnce Sorrel, [place]   3 years ago Vaginal lesion   [place], Clearnce Sor[place], [place]       Future Appointments             In 2 months Bacigalupo, Dionne Bucy, MD [facility] [place], [place]

## 2021-06-28 ENCOUNTER — Other Ambulatory Visit (HOSPITAL_COMMUNITY): Payer: Self-pay | Admitting: Orthopedic Surgery

## 2021-07-05 NOTE — Progress Notes (Signed)
Established patient visit   Patient: [name] [name]   DOB: 12-Mar-1951   70 y.o. Female  MRN: [id number] Visit Date: 07/06/2021  Today's healthcare provider: Lavon Paganini, MD   Chief Complaint  Patient presents with   Diabetes   Hypertension   Hyperlipidemia   Hematuria   I,Sulibeya S Dimas,acting as a scribe for Lavon Paganini, MD.,have documented all relevant documentation on the behalf of Lavon Paganini, MD,as directed by  Lavon Paganini, MD while in the presence of Lavon Paganini, MD.  Subjective    Hematuria This is a new problem. Episode onset: Monday. The problem is unchanged. She describes the hematuria as gross hematuria. She reports no clotting in her urine stream. Her pain is at a severity of 0/10. She is experiencing no pain. She describes her urine color as yellow. Irritative symptoms do not include frequency, nocturia or urgency. Associated symptoms include bladder pain. Pertinent negatives include no chills, dysuria, fever or flank pain.   X4 days, gross hematuria. Drinking more water and it is more faint  H/o kidney stones, no pain Some suprapubic discomfort   Diabetes Mellitus Type II, follow-up  Lab Results  Component Value Date   HGBA1C 6.7 (H) 03/24/2020   HGBA1C 6.8 (H) 03/24/2019   HGBA1C 6.6 (H) 01/28/2018   Last seen for diabetes 3 months ago.  Management since then includes Diet controlled.  Home blood sugar records: fasting range: 110s  Episodes of hypoglycemia? No    Most Recent Eye Exam: UTD  --------------------------------------------------------------------------------------------------- Hypertension, follow-up  BP Readings from Last 3 Encounters:  07/06/21 119/60  06/06/20 (!) 149/93  03/24/20 126/84   Wt Readings from Last 3 Encounters:  07/06/21 164 lb (74.4 kg)  06/06/20 173 lb (78.5 kg)  03/24/20 175 lb (79.4 kg)     She was last seen for hypertension 3 months ago.  BP at that visit was  126/84. Management since that visit includes Diet controlled. She is exercising. She is not adherent to low salt diet.   Outside blood pressures are not being checked.  --------------------------------------------------------------------------------------------------- Lipid/Cholesterol, follow-up  Last Lipid Panel: Lab Results  Component Value Date   CHOL 304 (H) 03/24/2020   LDLCALC 227 (H) 03/24/2020   HDL 47 03/24/2020   TRIG 157 (H) 03/24/2020   She was last seen for this 3 months ago.  Management since that visit includes not tolerating the tricor well (causing constipation) and not helped the cholesterol well, could stop this medication if desired.  Symptoms: No appetite changes No foot ulcerations  No chest pain No chest pressure/discomfort  No dyspnea No orthopnea  No fatigue Yes lower extremity edema  No palpitations No paroxysmal nocturnal dyspnea  No nausea Yes numbness or tingling of extremity  No polydipsia No polyuria  No speech difficulty No syncope   She is following a Regular diet. Current exercise: walking  Last metabolic panel Lab Results  Component Value Date   GLUCOSE 110 (H) 03/24/2020   NA 140 03/24/2020   K 4.5 03/24/2020   BUN 21 03/24/2020   CREATININE 1.09 (H) 03/24/2020   GFRNONAA 52 (L) 03/24/2020   CALCIUM 9.3 03/24/2020   AST 39 03/24/2020   ALT 34 (H) 03/24/2020   The 10-year ASCVD risk score (Arnett DK, et al., 2019) is: 16.5%  ---------------------------------------------------------------------------------------------------    Medications: Outpatient Medications Prior to Visit  Medication Sig   aspirin EC 81 MG tablet Take 1 tablet (81 mg total) by mouth daily.   Calcium-Magnesium-Vitamin  D (CALCIUM 1200+D3 PO) Take 1 capsule by mouth daily. Vitamin d 3 is 1000 IU   Cholecalciferol (VITAMIN D3) 2000 units TABS Take 1 capsule by mouth daily.   Glucose Blood (TRUE METRIX BLOOD GLUCOSE TEST VI) by In Vitro route.   meloxicam  (MOBIC) 15 MG tablet Take 15 mg by mouth daily.   [DISCONTINUED] sertraline (ZOLOFT) 100 MG tablet TAKE 1 TABLET BY MOUTH EVERYDAY AT BEDTIME   [DISCONTINUED] acetaminophen (TYLENOL) 500 MG tablet Take 2 tablets (1,000 mg total) by mouth every 8 (eight) hours as needed.   [DISCONTINUED] diclofenac (VOLTAREN) 75 MG EC tablet TAKE 1 TABLET BY MOUTH EVERY DAY   [DISCONTINUED] glucose blood test strip Check blood sugar once daily. (Patient not taking: Reported on 07/06/2021)   [DISCONTINUED] ondansetron (ZOFRAN) 4 MG tablet Take 1 tablet (4 mg total) by mouth every 6 (six) hours as needed for nausea or vomiting.   [DISCONTINUED] ONE TOUCH ULTRA TEST test strip CHECK BLOOD SUGAR ONCE DAILY   No facility-administered medications prior to visit.    Review of Systems  Constitutional:  Negative for chills and fever.  Respiratory:  Negative for shortness of breath.   Cardiovascular:  Negative for chest pain and palpitations.  Genitourinary:  Positive for hematuria. Negative for dysuria, flank pain, frequency, nocturia and urgency.       Objective    BP 119/60 (BP Location: Left Arm, Patient Position: Sitting, Cuff Size: Large)   Pulse 68   Temp (!) 97.5 F (36.4 C) (Temporal)   Resp 16   Ht 5\' 2"  (1.575 m)   Wt 164 lb (74.4 kg)   SpO2 99%   BMI 30.00 kg/m  BP Readings from Last 3 Encounters:  07/06/21 119/60  06/06/20 (!) 149/93  03/24/20 126/84   Wt Readings from Last 3 Encounters:  07/06/21 164 lb (74.4 kg)  06/06/20 173 lb (78.5 kg)  03/24/20 175 lb (79.4 kg)      Physical Exam Vitals reviewed.  Constitutional:      General: She is not in acute distress.    Appearance: Normal appearance. She is well-developed. She is not diaphoretic.  HENT:     Head: Normocephalic and atraumatic.  Eyes:     General: No scleral icterus.    Conjunctiva/sclera: Conjunctivae normal.  Neck:     Thyroid: No thyromegaly.  Cardiovascular:     Rate and Rhythm: Normal rate and regular rhythm.      Pulses: Normal pulses.     Heart sounds: Normal heart sounds. No murmur heard. Pulmonary:     Effort: Pulmonary effort is normal. No respiratory distress.     Breath sounds: Normal breath sounds. No wheezing, rhonchi or rales.  Abdominal:     General: There is no distension.     Palpations: Abdomen is soft.     Tenderness: There is no abdominal tenderness. There is no right CVA tenderness, left CVA tenderness, guarding or rebound.  Musculoskeletal:     Cervical back: Neck supple.     Right lower leg: No edema.     Left lower leg: No edema.  Lymphadenopathy:     Cervical: No cervical adenopathy.  Skin:    General: Skin is warm and dry.     Findings: No rash.  Neurological:     Mental Status: She is alert and oriented to person, place, and time. Mental status is at baseline.  Psychiatric:        Mood and Affect: Mood normal.  Behavior: Behavior normal.      No results found for any visits on 07/06/21.  Assessment & Plan     Problem List Items Addressed This Visit       Cardiovascular and Mediastinum   Hypertension associated with diabetes ([place]) - Primary    Well controlled Continue lifestyle control - no meds Recheck metabolic panel F/u in 6 months       Relevant Orders   Comprehensive metabolic panel     Endocrine   Diabetes mellitus, type 2 ([place])    Well controlled with A1c 6.2 Continue diet control-no meds Declines Prevnar UTD on eye exam, foot exam (completed today) Check urine microalbumin/creatinine ratio Discussed diet and exercise F/u in 6 months       Relevant Orders   POCT glycosylated hemoglobin (Hb A1C)   Urine Microalbumin w/creat. ratio   Hyperlipidemia associated with type 2 diabetes mellitus ([place])    Reviewed last lipid panel Not currently on a statin-has tried multiple and failed due to myalgias Recheck FLP and CMP Discussed diet and exercise       Relevant Orders   Comprehensive metabolic panel   Lipid panel      Genitourinary   CKD (chronic kidney disease) stage 3, GFR 30-59 ml/min (HCC)    Chronic and stable Recheck metabolic panel Avoid nephrotoxic meds       Relevant Orders   Comprehensive metabolic panel     Other   Clinical depression    In remission  Stable Continue zoloft at current dose May consider taper in the future      Relevant Medications   sertraline (ZOLOFT) 100 MG tablet   Abnormal liver enzymes   Relevant Orders   Comprehensive metabolic panel   Avitaminosis D   Relevant Orders   VITAMIN D 25 Hydroxy (Vit-D Deficiency, Fractures)   Hematuria    New problem of gross hematuria x4 days that is slowly improving History of nephrolithiasis Check urine micro, culture KUB to evaluate for recurrent stones May need follow-up with urology       Relevant Orders   Urine Microscopic   Urine Culture   DG Abd 1 View   Other Visit Diagnoses     Nephrolithiasis       Relevant Orders   DG Abd 1 View   Pneumococcal vaccination declined       Depression       Relevant Medications   sertraline (ZOLOFT) 100 MG tablet        Return in about 6 months (around 01/04/2022) for AWV, CPE.      I, Lavon Paganini, MD, have reviewed all documentation for this visit. The documentation on 07/06/21 for the exam, diagnosis, procedures, and orders are all accurate and complete.   Lorry Furber, Dionne Bucy, MD, MPH [facility]

## 2021-07-06 ENCOUNTER — Other Ambulatory Visit: Payer: Self-pay

## 2021-07-06 ENCOUNTER — Encounter: Payer: Self-pay | Admitting: Family Medicine

## 2021-07-06 ENCOUNTER — Ambulatory Visit
Admission: RE | Admit: 2021-07-06 | Discharge: 2021-07-06 | Disposition: A | Discharge status: Home / Self Care | Payer: Medicare PPO | Source: Ambulatory Visit | Attending: Family Medicine | Admitting: Family Medicine

## 2021-07-06 ENCOUNTER — Ambulatory Visit: Payer: Medicare PPO | Admitting: Family Medicine

## 2021-07-06 ENCOUNTER — Ambulatory Visit: Payer: Self-pay

## 2021-07-06 ENCOUNTER — Ambulatory Visit
Admission: RE | Admit: 2021-07-06 | Discharge: 2021-07-06 | Disposition: A | Discharge status: Home / Self Care | Payer: Medicare PPO | Attending: Family Medicine | Admitting: Family Medicine

## 2021-07-06 VITALS — BP 119/60 | HR 68 | Temp 97.5°F | Resp 16 | Ht 62.0 in | Wt 164.0 lb

## 2021-07-06 DIAGNOSIS — N1831 Chronic kidney disease, stage 3a: Secondary | ICD-10-CM

## 2021-07-06 DIAGNOSIS — F3342 Major depressive disorder, recurrent, in full remission: Secondary | ICD-10-CM

## 2021-07-06 DIAGNOSIS — N200 Calculus of kidney: Secondary | ICD-10-CM

## 2021-07-06 DIAGNOSIS — F32A Depression, unspecified: Secondary | ICD-10-CM

## 2021-07-06 DIAGNOSIS — E559 Vitamin D deficiency, unspecified: Secondary | ICD-10-CM

## 2021-07-06 DIAGNOSIS — I10 Essential (primary) hypertension: Secondary | ICD-10-CM

## 2021-07-06 DIAGNOSIS — R319 Hematuria, unspecified: Secondary | ICD-10-CM | POA: Diagnosis not present

## 2021-07-06 DIAGNOSIS — E785 Hyperlipidemia, unspecified: Secondary | ICD-10-CM

## 2021-07-06 DIAGNOSIS — E1159 Type 2 diabetes mellitus with other circulatory complications: Secondary | ICD-10-CM

## 2021-07-06 DIAGNOSIS — Z2821 Immunization not carried out because of patient refusal: Secondary | ICD-10-CM

## 2021-07-06 DIAGNOSIS — E1122 Type 2 diabetes mellitus with diabetic chronic kidney disease: Secondary | ICD-10-CM

## 2021-07-06 DIAGNOSIS — E1169 Type 2 diabetes mellitus with other specified complication: Secondary | ICD-10-CM | POA: Diagnosis not present

## 2021-07-06 DIAGNOSIS — E7800 Pure hypercholesterolemia, unspecified: Secondary | ICD-10-CM

## 2021-07-06 DIAGNOSIS — R748 Abnormal levels of other serum enzymes: Secondary | ICD-10-CM

## 2021-07-06 DIAGNOSIS — I152 Hypertension secondary to endocrine disorders: Secondary | ICD-10-CM

## 2021-07-06 LAB — POCT URINALYSIS DIPSTICK
Bilirubin, UA: NEGATIVE
Glucose, UA: NEGATIVE
Ketones, UA: NEGATIVE
Leukocytes, UA: NEGATIVE
Nitrite, UA: NEGATIVE
Protein, UA: NEGATIVE
Spec Grav, UA: 1.02 (ref 1.010–1.025)
Urobilinogen, UA: 0.2 E.U./dL
pH, UA: 6 (ref 5.0–8.0)

## 2021-07-06 LAB — POCT GLYCOSYLATED HEMOGLOBIN (HGB A1C)
Est. average glucose Bld gHb Est-mCnc: 131
Hemoglobin A1C: 6.2 % — AB (ref 4.0–5.6)

## 2021-07-06 MED ORDER — SERTRALINE HCL 100 MG PO TABS
100.0000 mg | ORAL_TABLET | Freq: Every day | ORAL | 1 refills | Status: DC
Start: 2021-07-06 — End: 2021-12-31

## 2021-07-06 NOTE — Assessment & Plan Note (Signed)
In remission  Stable Continue zoloft at current dose May consider taper in the future

## 2021-07-06 NOTE — Assessment & Plan Note (Signed)
Reviewed last lipid panel Not currently on a statin-has tried multiple and failed due to myalgias Recheck FLP and CMP Discussed diet and exercise

## 2021-07-06 NOTE — Assessment & Plan Note (Signed)
Well controlled with A1c 6.2 Continue diet control-no meds Declines Prevnar UTD on eye exam, foot exam (completed today) Check urine microalbumin/creatinine ratio Discussed diet and exercise F/u in 6 months

## 2021-07-06 NOTE — Assessment & Plan Note (Signed)
Chronic and stable Recheck metabolic panel Avoid nephrotoxic meds  

## 2021-07-06 NOTE — Assessment & Plan Note (Addendum)
Well controlled Continue lifestyle control - no meds Recheck metabolic panel F/u in 6 months

## 2021-07-06 NOTE — Telephone Encounter (Signed)
Stat abd xray results called in by Suanne Marker at [facility].   Stones L abdomen, 2 classifications  up to 58mm CT recommended  Stone in upper pole of L kidney  called on call provider to make aware. Left VM to call back.

## 2021-07-06 NOTE — Assessment & Plan Note (Signed)
New problem of gross hematuria x4 days that is slowly improving History of nephrolithiasis Check urine micro, culture KUB to evaluate for recurrent stones May need follow-up with urology

## 2021-07-06 NOTE — Assessment & Plan Note (Signed)
>>  ASSESSMENT AND PLAN FOR DIABETES MELLITUS, TYPE 2 (HCC) WRITTEN ON 07/06/2021  9:38 AM BY BACIGALUPO, Marzella Schlein, MD  Well controlled with A1c 6.2 Continue diet control-no meds Declines Prevnar UTD on eye exam, foot exam (completed today) Check urine microalbumin/creatinine ratio Discussed diet and exercise F/u in 6 months

## 2021-07-06 NOTE — Telephone Encounter (Signed)
Dr Brita Romp called back, gave up ABD XR results and pt's number.

## 2021-07-07 LAB — URINALYSIS, MICROSCOPIC ONLY
Bacteria, UA: NONE SEEN
Casts: NONE SEEN /lpf
Epithelial Cells (non renal): NONE SEEN /hpf (ref 0–10)
RBC, Urine: >30 /hpf — AB (ref 0–2)

## 2021-07-07 LAB — COMPREHENSIVE METABOLIC PANEL
ALT: 23 IU/L (ref 0–32)
AST: 20 IU/L (ref 0–40)
Albumin/Globulin Ratio: 1.9 (ref 1.2–2.2)
Albumin: 4.4 g/dL (ref 3.8–4.8)
Alkaline Phosphatase: 99 IU/L (ref 44–121)
BUN/Creatinine Ratio: 16 (ref 12–28)
BUN: 21 mg/dL (ref 8–27)
Bilirubin Total: 0.4 mg/dL (ref 0.0–1.2)
CO2: 21 mmol/L (ref 20–29)
Calcium: 9.2 mg/dL (ref 8.7–10.3)
Chloride: 105 mmol/L (ref 96–106)
Creatinine, Ser: 1.28 mg/dL — ABNORMAL HIGH (ref 0.57–1.00)
Globulin, Total: 2.3 g/dL (ref 1.5–4.5)
Glucose: 101 mg/dL — ABNORMAL HIGH (ref 70–99)
Potassium: 4.6 mmol/L (ref 3.5–5.2)
Sodium: 140 mmol/L (ref 134–144)
Total Protein: 6.7 g/dL (ref 6.0–8.5)
eGFR: 45 mL/min/{1.73_m2} — ABNORMAL LOW (ref >59)

## 2021-07-07 LAB — VITAMIN D 25 HYDROXY (VIT D DEFICIENCY, FRACTURES): Vit D, 25-Hydroxy: 75 ng/mL (ref 30.0–100.0)

## 2021-07-07 LAB — LIPID PANEL
Chol/HDL Ratio: 4.9 ratio — ABNORMAL HIGH (ref 0.0–4.4)
Cholesterol, Total: 225 mg/dL — ABNORMAL HIGH (ref 100–199)
HDL: 46 mg/dL (ref >39)
LDL Chol Calc (NIH): 159 mg/dL — ABNORMAL HIGH (ref 0–99)
Triglycerides: 112 mg/dL (ref 0–149)
VLDL Cholesterol Cal: 20 mg/dL (ref 5–40)

## 2021-07-07 LAB — MICROALBUMIN / CREATININE URINE RATIO
Creatinine, Urine: 119.1 mg/dL
Microalb/Creat Ratio: 55 mg/g creat — ABNORMAL HIGH (ref 0–29)
Microalbumin, Urine: 65.8 ug/mL

## 2021-07-10 NOTE — Telephone Encounter (Signed)
Called patient and she reports that she has seen Dr. Sharyn Creamer and states that she has had this problem and is still declining to follow up with urology. [name] [name]

## 2021-07-11 LAB — URINE CULTURE

## 2021-07-25 ENCOUNTER — Encounter: Payer: Self-pay | Admitting: Family Medicine

## 2021-07-26 ENCOUNTER — Other Ambulatory Visit: Payer: Self-pay | Admitting: Family Medicine

## 2021-07-26 DIAGNOSIS — B029 Zoster without complications: Secondary | ICD-10-CM

## 2021-07-26 MED ORDER — VALACYCLOVIR HCL 1 G PO TABS: 1000.0000 mg | ORAL_TABLET | Freq: Three times a day (TID) | ORAL | 0 refills | Status: AC

## 2021-08-16 ENCOUNTER — Encounter (HOSPITAL_BASED_OUTPATIENT_CLINIC_OR_DEPARTMENT_OTHER): Payer: Self-pay

## 2021-08-16 ENCOUNTER — Ambulatory Visit (HOSPITAL_BASED_OUTPATIENT_CLINIC_OR_DEPARTMENT_OTHER): Admit: 2021-08-16 | Payer: Medicare PPO | Admitting: Orthopedic Surgery

## 2021-08-16 SURGERY — EXCISION HAGLUND'S DEFORMITY WITH ACHILLES TENDON REPAIR
Anesthesia: General | Laterality: Right

## 2021-08-21 ENCOUNTER — Other Ambulatory Visit (HOSPITAL_COMMUNITY): Payer: Self-pay | Admitting: Orthopedic Surgery

## 2021-09-06 ENCOUNTER — Encounter: Payer: Self-pay | Admitting: Family Medicine

## 2021-09-06 ENCOUNTER — Other Ambulatory Visit: Payer: Self-pay

## 2021-09-06 ENCOUNTER — Telehealth: Payer: Self-pay

## 2021-09-06 MED ORDER — PAXLOVID (150/100) 10 X 150 MG & 10 X 100MG PO TBPK
2.0000 | ORAL_TABLET | Freq: Two times a day (BID) | ORAL | 0 refills | Status: DC
  Filled 2021-09-06: qty 20, 5d supply, fill #0

## 2021-09-06 NOTE — Telephone Encounter (Signed)
Outpatient Pharmacy Oral COVID Treatment Note  I connected with [name] [name] on 09/06/2021/12:14 PM by telephone and verified that I am speaking with the correct person using two identifiers.  I discussed the limitations, risks, security, and privacy concerns of performing an evaluation and management service by telephone and the availability of in person appointments via referral to a physician. The patient expressed understanding and agreed to proceed.  Pharmacy location: [place]  Diagnosis: COVID-19 infection  Purpose of visit: Discussion of potential use of Paxlovid, a new treatment for mild to moderate COVID-19 viral infection in non-hospitalized patients.  Subjective/Objective: Patient is a 71 y.o. female who is presenting with COVID 19 viral infection.  COVID 19 viral infection. Their symptoms began on 09/04/21 with sore throat, cough, congestion.  The patient has confirmed COVID-19 via a home test on 09/06/21.   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   Portal vein thrombosis    Shingles    VAIN I (vaginal intraepithelial neoplasia grade I)      Allergies  Allergen Reactions   Atorvastatin Other (See Comments)    Muscle spasms   Lovastatin     muscle aches   Statins Other (See Comments)   Wheat Bran Swelling   Zetia  [Ezetimibe]     muscle aches   Metronidazole Nausea And Vomiting and Rash     Current Outpatient Medications:    aspirin EC 81 MG tablet, Take 1 tablet (81 mg total) by mouth daily., Disp: 1 tablet, Rfl: 0   Calcium-Magnesium-Vitamin D (CALCIUM 1200+D3 PO), Take  1 capsule by mouth daily. Vitamin d 3 is 1000 IU, Disp: , Rfl:    Cholecalciferol (VITAMIN D3) 2000 units TABS, Take 1 capsule by mouth daily., Disp: , Rfl:    Glucose Blood (TRUE METRIX BLOOD GLUCOSE TEST VI), by In Vitro route., Disp: , Rfl:    meloxicam (MOBIC) 15 MG tablet, Take 15 mg by mouth daily., Disp: , Rfl:    nirmatrelvir & ritonavir (PAXLOVID, 150/100,) 10 x 150 MG & 10 x 100MG TBPK, Take 2 tablets ( 1 tablet of nirmatrelavir and 1 tablet of ritonavir) by mouth 2 (two) times daily., Disp: 20 tablet, Rfl: 0   sertraline (ZOLOFT) 100 MG tablet, Take 1 tablet (100 mg total) by mouth daily., Disp: 90 tablet, Rfl: 1  Lab Monitoring: eGFR 45  Drug Interactions Noted: none  Plan:  This patient is a 71 y.o. female that meets the criteria for Emergency Use Authorization of Paxlovid. After reviewing the emergency use authorization with the patient, the patient agrees to receive Paxlovid.  Through FDA guidance and current [facility] standing order Paxlovid will be prescribed to the patient.   Patient contacted for counseling on 09/06/21 and verbalized understanding.   Delivery or Pick-Up Date: 09/06/21  Follow up instructions:    Take prescription BID x 5 days as directed Counseling was provided by pharmacist. Reach out to pharmacist with follow up questions For concerns regarding  further COVID symptoms please follow up with your PCP or urgent care For urgent or life-threatening issues, seek care at your local emergency department   Lawarence Meek J 09/06/2021, 12:14 PM [facility] Pharmacist Phone# [phone]

## 2021-09-13 ENCOUNTER — Other Ambulatory Visit: Payer: Self-pay | Admitting: Family Medicine

## 2021-09-14 ENCOUNTER — Other Ambulatory Visit (HOSPITAL_COMMUNITY): Payer: Self-pay

## 2021-09-14 MED ORDER — MELOXICAM 15 MG PO TABS
15.0000 mg | ORAL_TABLET | Freq: Every day | ORAL | 1 refills | Status: DC
  Filled 2021-09-14: qty 30, 30d supply, fill #0
  Filled 2021-10-29: qty 30, 30d supply, fill #1

## 2021-09-15 ENCOUNTER — Other Ambulatory Visit (HOSPITAL_COMMUNITY): Payer: Self-pay

## 2021-09-16 IMAGING — MG DIGITAL SCREENING BILAT W/ TOMO W/ CAD
8 series · 8 of 24 positions shown · non-contrast
Comparison: Previous exam(s).

CLINICAL DATA: Screening.

EXAM:
DIGITAL SCREENING BILATERAL MAMMOGRAM WITH TOMO AND CAD

[L CC synth-2D]
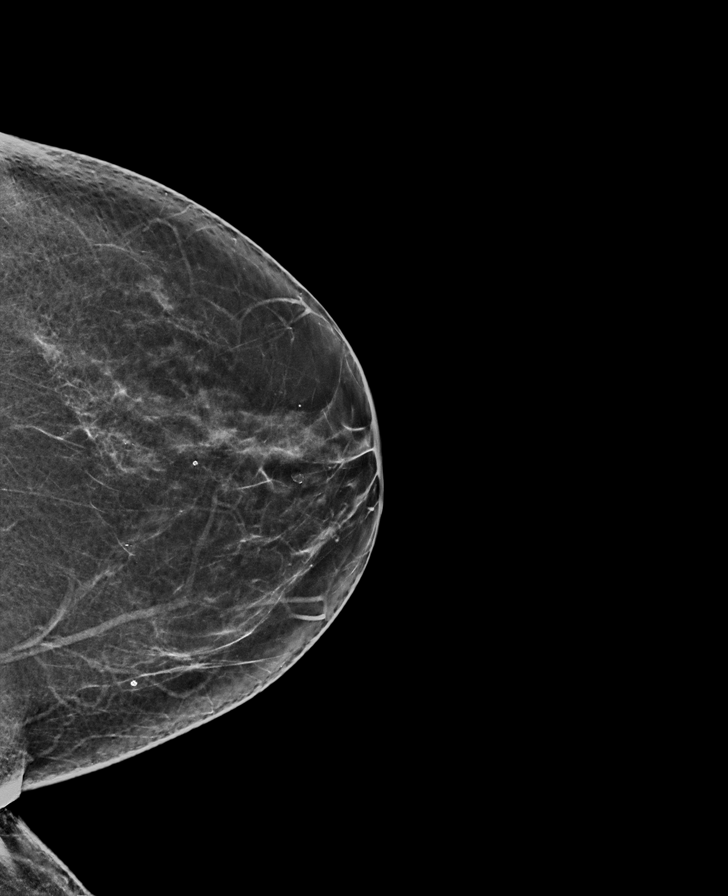

[R MLO synth-2D]
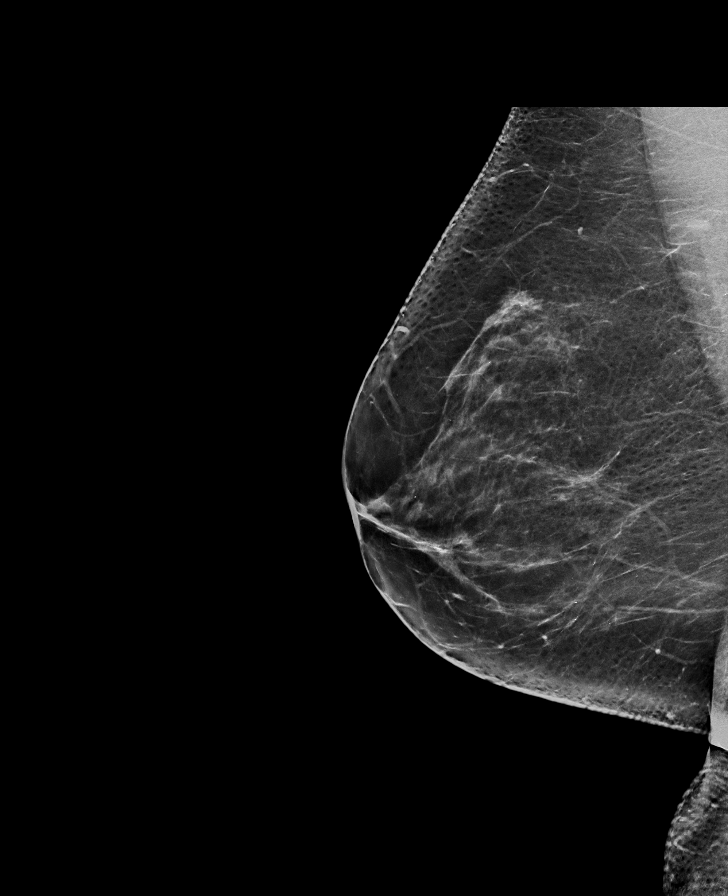

[L MLO synth-2D]
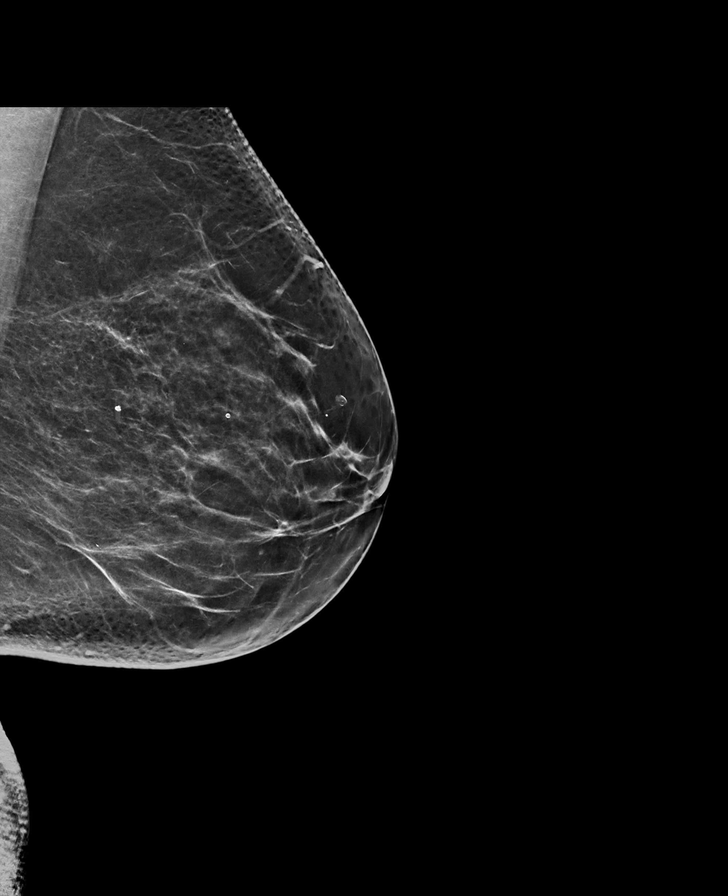

[R CC synth-2D]
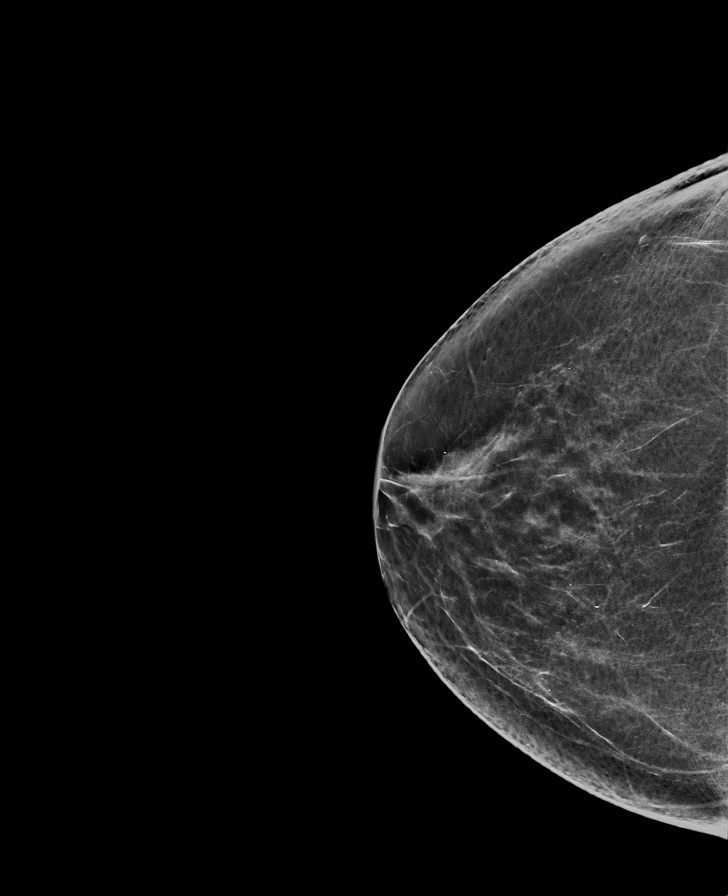

[R CC tomo · tomo slice 37/72.0]
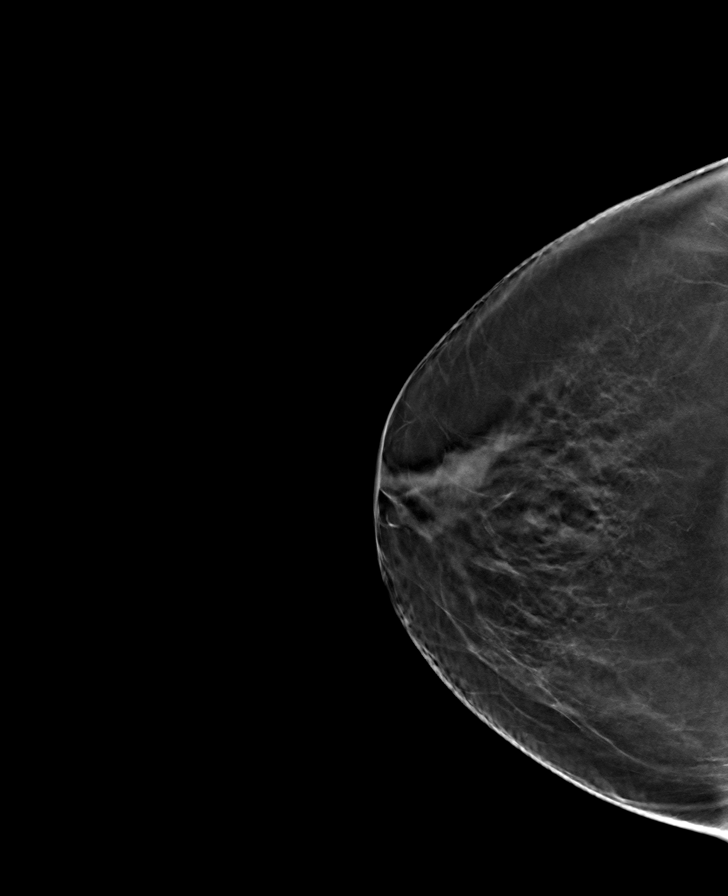

[L CC tomo · tomo slice 36/71.0]
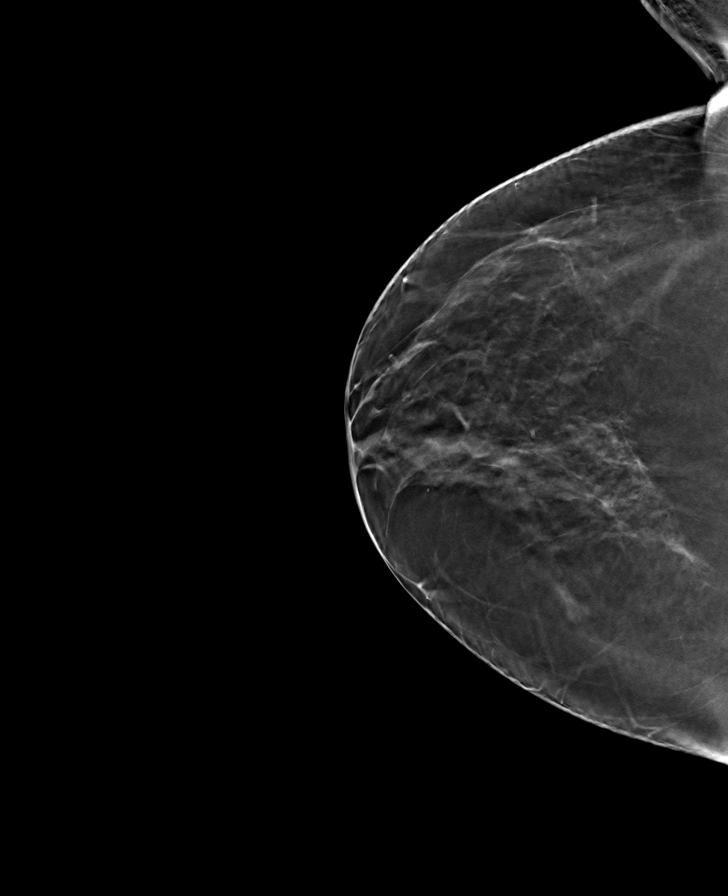

[L MLO tomo · tomo slice 37/72.0]
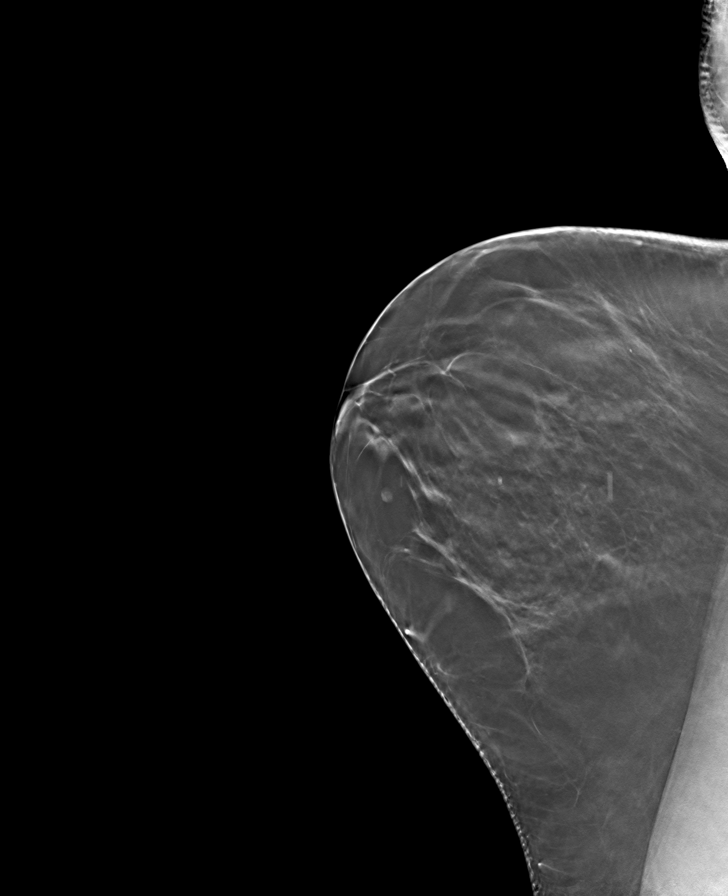

[R MLO tomo · tomo slice 39/77.0]
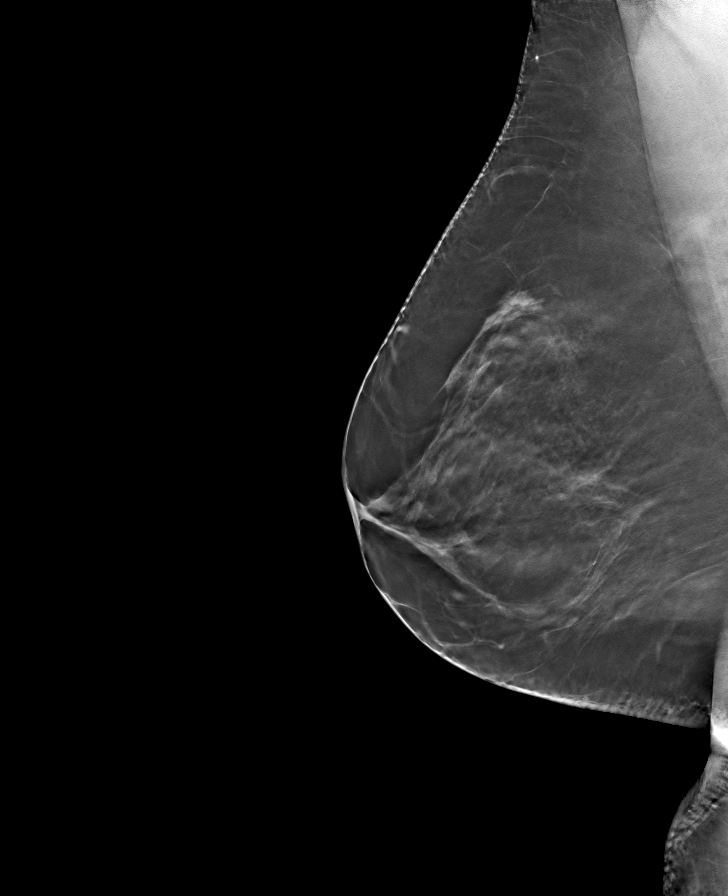

[8 of 24 positions shown; findings below may reference images not displayed]

ACR Breast Density Category b: There are scattered areas of
fibroglandular density.
FINDINGS: There are no findings suspicious for malignancy. Images were
processed with CAD.
IMPRESSION: No mammographic evidence of malignancy. A result letter of this
screening mammogram will be mailed directly to the patient.

RECOMMENDATION:
Screening mammogram in one year. (Code:[id number])

BI-RADS CATEGORY  1: Negative.

## 2021-10-02 ENCOUNTER — Other Ambulatory Visit: Payer: Self-pay

## 2021-10-02 ENCOUNTER — Encounter (HOSPITAL_BASED_OUTPATIENT_CLINIC_OR_DEPARTMENT_OTHER): Payer: Self-pay | Admitting: Orthopedic Surgery

## 2021-10-05 ENCOUNTER — Encounter (HOSPITAL_BASED_OUTPATIENT_CLINIC_OR_DEPARTMENT_OTHER)
Admission: RE | Admit: 2021-10-05 | Discharge: 2021-10-05 | Disposition: A | Discharge status: Home / Self Care | Payer: Medicare PPO | Source: Ambulatory Visit | Attending: Orthopedic Surgery | Admitting: Orthopedic Surgery

## 2021-10-05 DIAGNOSIS — Z01812 Encounter for preprocedural laboratory examination: Secondary | ICD-10-CM | POA: Diagnosis not present

## 2021-10-05 LAB — BASIC METABOLIC PANEL
Anion gap: 8 (ref 5–15)
BUN: 23 mg/dL (ref 8–23)
CO2: 25 mmol/L (ref 22–32)
Calcium: 9.6 mg/dL (ref 8.9–10.3)
Chloride: 106 mmol/L (ref 98–111)
Creatinine, Ser: 1.18 mg/dL — ABNORMAL HIGH (ref 0.44–1.00)
GFR, Estimated: 50 mL/min — ABNORMAL LOW (ref >60)
Glucose, Bld: 89 mg/dL (ref 70–99)
Potassium: 4.6 mmol/L (ref 3.5–5.1)
Sodium: 139 mmol/L (ref 135–145)

## 2021-10-05 NOTE — Progress Notes (Signed)

## 2021-10-10 NOTE — Anesthesia Preprocedure Evaluation (Addendum)
Anesthesia Evaluation  ?Patient identified by MRN, date of birth, ID band ?Patient awake ? ? ? ?Reviewed: ?Allergy & Precautions, NPO status , Patient's Chart, lab work & pertinent test results ? ?Airway ?Mallampati: III ? ?TM Distance: >3 FB ?Neck ROM: Full ? ? ? Dental ?no notable dental hx. ? ?  ?Pulmonary ?neg pulmonary ROS,  ?  ?Pulmonary exam normal ?breath sounds clear to auscultation ? ? ? ? ? ? Cardiovascular ?Normal cardiovascular exam ?Rhythm:Regular Rate:Normal ? ? ?  ?Neuro/Psych ?PSYCHIATRIC DISORDERS Anxiety Depression  Neuromuscular disease   ? GI/Hepatic ?negative GI ROS, Neg liver ROS,   ?Endo/Other  ?negative endocrine ROS ? Renal/GU ?Renal disease  ? ?  ?Musculoskeletal ? ?(+) Arthritis ,  ? Abdominal ?  ?Peds ? Hematology ?negative hematology ROS ?(+)   ?Anesthesia Other Findings ?right foot posterior calcaneal exostosis, acquired short right Achilles tendon ? Reproductive/Obstetrics ? ?  ? ? ? ? ? ? ? ? ? ? ? ? ? ?  ?  ? ? ? ? ? ? ? ?Anesthesia Physical ?Anesthesia Plan ? ?ASA: 2 ? ?Anesthesia Plan: General and Regional  ? ?Post-op Pain Management:   ? ?Induction: Intravenous ? ?PONV Risk Score and Plan: Ondansetron, Dexamethasone, Midazolam and Treatment may vary due to age or medical condition ? ?Airway Management Planned: Oral ETT ? ?Additional Equipment:  ? ?Intra-op Plan:  ? ?Post-operative Plan: Extubation in OR ? ?Informed Consent: I have reviewed the patients History and Physical, chart, labs and discussed the procedure including the risks, benefits and alternatives for the proposed anesthesia with the patient or authorized representative who has indicated his/her understanding and acceptance.  ? ? ? ?Dental advisory given ? ?Plan Discussed with: CRNA ? ?Anesthesia Plan Comments:   ? ? ? ? ? ?Anesthesia Quick Evaluation ? ?

## 2021-10-11 ENCOUNTER — Encounter (HOSPITAL_BASED_OUTPATIENT_CLINIC_OR_DEPARTMENT_OTHER)
Admission: RE | Disposition: A | Discharge status: Home / Self Care | Payer: Self-pay | Source: Home / Self Care | Attending: Orthopedic Surgery

## 2021-10-11 ENCOUNTER — Encounter (HOSPITAL_BASED_OUTPATIENT_CLINIC_OR_DEPARTMENT_OTHER): Payer: Self-pay | Admitting: Orthopedic Surgery

## 2021-10-11 ENCOUNTER — Other Ambulatory Visit: Payer: Self-pay

## 2021-10-11 ENCOUNTER — Ambulatory Visit (HOSPITAL_BASED_OUTPATIENT_CLINIC_OR_DEPARTMENT_OTHER): Payer: Medicare PPO | Admitting: Anesthesiology

## 2021-10-11 ENCOUNTER — Ambulatory Visit (HOSPITAL_BASED_OUTPATIENT_CLINIC_OR_DEPARTMENT_OTHER)
Admission: RE | Admit: 2021-10-11 | Discharge: 2021-10-11 | Disposition: A | Discharge status: Home / Self Care | Payer: Medicare PPO | Attending: Orthopedic Surgery | Admitting: Orthopedic Surgery

## 2021-10-11 DIAGNOSIS — N189 Chronic kidney disease, unspecified: Secondary | ICD-10-CM | POA: Insufficient documentation

## 2021-10-11 DIAGNOSIS — M216X1 Other acquired deformities of right foot: Secondary | ICD-10-CM

## 2021-10-11 DIAGNOSIS — I129 Hypertensive chronic kidney disease with stage 1 through stage 4 chronic kidney disease, or unspecified chronic kidney disease: Secondary | ICD-10-CM | POA: Insufficient documentation

## 2021-10-11 DIAGNOSIS — F418 Other specified anxiety disorders: Secondary | ICD-10-CM | POA: Insufficient documentation

## 2021-10-11 DIAGNOSIS — M7661 Achilles tendinitis, right leg: Secondary | ICD-10-CM

## 2021-10-11 DIAGNOSIS — M9261 Juvenile osteochondrosis of tarsus, right ankle: Secondary | ICD-10-CM | POA: Diagnosis not present

## 2021-10-11 DIAGNOSIS — G8918 Other acute postprocedural pain: Secondary | ICD-10-CM | POA: Diagnosis not present

## 2021-10-11 DIAGNOSIS — M79671 Pain in right foot: Secondary | ICD-10-CM | POA: Diagnosis present

## 2021-10-11 DIAGNOSIS — M6701 Short Achilles tendon (acquired), right ankle: Secondary | ICD-10-CM

## 2021-10-11 DIAGNOSIS — M7731 Calcaneal spur, right foot: Secondary | ICD-10-CM | POA: Diagnosis not present

## 2021-10-11 DIAGNOSIS — N1831 Chronic kidney disease, stage 3a: Secondary | ICD-10-CM

## 2021-10-11 HISTORY — DX: Other complications of anesthesia, initial encounter: T88.59XA

## 2021-10-11 HISTORY — DX: Nausea with vomiting, unspecified: R11.2

## 2021-10-11 HISTORY — PX: EXCISION HAGLUND'S DEFORMITY WITH ACHILLES TENDON REPAIR: SHX5627

## 2021-10-11 HISTORY — DX: Prediabetes: R73.03

## 2021-10-11 HISTORY — DX: Other specified postprocedural states: Z98.890

## 2021-10-11 SURGERY — EXCISION HAGLUND'S DEFORMITY WITH ACHILLES TENDON REPAIR
Anesthesia: Regional | Site: Ankle | Laterality: Right

## 2021-10-11 MED ORDER — ASPIRIN EC 81 MG PO TBEC: 81.0000 mg | DELAYED_RELEASE_TABLET | Freq: Two times a day (BID) | ORAL | 0 refills | Status: DC

## 2021-10-11 MED ORDER — SODIUM CHLORIDE 0.9 % IV SOLN: INTRAVENOUS | Status: DC

## 2021-10-11 MED ORDER — 0.9 % SODIUM CHLORIDE (POUR BTL) OPTIME
TOPICAL | Status: DC | PRN
Start: 2021-10-11 — End: 2021-10-11
  Administered 2021-10-11: 500 mL

## 2021-10-11 MED ORDER — FENTANYL CITRATE (PF) 100 MCG/2ML IJ SOLN
50.0000 ug | Freq: Once | INTRAMUSCULAR | Status: AC
  Administered 2021-10-11: 50 ug via INTRAVENOUS

## 2021-10-11 MED ORDER — EPHEDRINE SULFATE (PRESSORS) 50 MG/ML IJ SOLN
INTRAMUSCULAR | Status: DC | PRN
  Administered 2021-10-11 (×4): 10 mg via INTRAVENOUS

## 2021-10-11 MED ORDER — ONDANSETRON HCL 4 MG/2ML IJ SOLN
INTRAMUSCULAR | Status: DC | PRN
  Administered 2021-10-11: 4 mg via INTRAVENOUS

## 2021-10-11 MED ORDER — LIDOCAINE HCL (CARDIAC) PF 100 MG/5ML IV SOSY
PREFILLED_SYRINGE | INTRAVENOUS | Status: DC | PRN
  Administered 2021-10-11: 30 mg via INTRAVENOUS

## 2021-10-11 MED ORDER — LIDOCAINE 2% (20 MG/ML) 5 ML SYRINGE
INTRAMUSCULAR | Status: AC
  Filled 2021-10-11: qty 20

## 2021-10-11 MED ORDER — SENNA 8.6 MG PO TABS: 2.0000 | ORAL_TABLET | Freq: Two times a day (BID) | ORAL | 0 refills | Status: DC

## 2021-10-11 MED ORDER — DEXAMETHASONE SODIUM PHOSPHATE 4 MG/ML IJ SOLN
INTRAMUSCULAR | Status: DC | PRN
Start: 2021-10-11 — End: 2021-10-11
  Administered 2021-10-11: 4 mg via INTRAVENOUS

## 2021-10-11 MED ORDER — MIDAZOLAM HCL 2 MG/2ML IJ SOLN
INTRAMUSCULAR | Status: AC
  Filled 2021-10-11: qty 2

## 2021-10-11 MED ORDER — LACTATED RINGERS IV SOLN: INTRAVENOUS | Status: DC

## 2021-10-11 MED ORDER — PROPOFOL 500 MG/50ML IV EMUL
INTRAVENOUS | Status: AC
  Filled 2021-10-11: qty 100

## 2021-10-11 MED ORDER — FENTANYL CITRATE (PF) 100 MCG/2ML IJ SOLN: 25.0000 ug | INTRAMUSCULAR | Status: DC | PRN

## 2021-10-11 MED ORDER — PROPOFOL 10 MG/ML IV BOLUS
INTRAVENOUS | Status: DC | PRN
  Administered 2021-10-11: 120 mg via INTRAVENOUS

## 2021-10-11 MED ORDER — PHENYLEPHRINE 40 MCG/ML (10ML) SYRINGE FOR IV PUSH (FOR BLOOD PRESSURE SUPPORT)
PREFILLED_SYRINGE | INTRAVENOUS | Status: AC
  Filled 2021-10-11: qty 30

## 2021-10-11 MED ORDER — ACETAMINOPHEN 500 MG PO TABS
ORAL_TABLET | ORAL | Status: AC
  Filled 2021-10-11: qty 2

## 2021-10-11 MED ORDER — SUGAMMADEX SODIUM 200 MG/2ML IV SOLN
INTRAVENOUS | Status: DC | PRN
  Administered 2021-10-11: 200 mg via INTRAVENOUS

## 2021-10-11 MED ORDER — VANCOMYCIN HCL 500 MG IV SOLR
INTRAVENOUS | Status: AC
  Filled 2021-10-11: qty 10

## 2021-10-11 MED ORDER — FENTANYL CITRATE (PF) 100 MCG/2ML IJ SOLN
INTRAMUSCULAR | Status: AC
Start: 2021-10-11 — End: ?
  Filled 2021-10-11: qty 2

## 2021-10-11 MED ORDER — FENTANYL CITRATE (PF) 100 MCG/2ML IJ SOLN
INTRAMUSCULAR | Status: DC | PRN
  Administered 2021-10-11: 25 ug via INTRAVENOUS

## 2021-10-11 MED ORDER — BUPIVACAINE-EPINEPHRINE (PF) 0.5% -1:200000 IJ SOLN
INTRAMUSCULAR | Status: DC | PRN
  Administered 2021-10-11: 30 mL via PERINEURAL

## 2021-10-11 MED ORDER — DOCUSATE SODIUM 100 MG PO CAPS: 100.0000 mg | ORAL_CAPSULE | Freq: Two times a day (BID) | ORAL | 0 refills | Status: DC

## 2021-10-11 MED ORDER — DEXAMETHASONE SODIUM PHOSPHATE 10 MG/ML IJ SOLN
INTRAMUSCULAR | Status: AC
  Filled 2021-10-11: qty 2

## 2021-10-11 MED ORDER — KETOROLAC TROMETHAMINE 15 MG/ML IJ SOLN: 15.0000 mg | Freq: Once | INTRAMUSCULAR | Status: DC | PRN

## 2021-10-11 MED ORDER — ONDANSETRON HCL 4 MG/2ML IJ SOLN: 4.0000 mg | Freq: Once | INTRAMUSCULAR | Status: DC | PRN

## 2021-10-11 MED ORDER — ROCURONIUM BROMIDE 100 MG/10ML IV SOLN
INTRAVENOUS | Status: DC | PRN
  Administered 2021-10-11: 60 mg via INTRAVENOUS

## 2021-10-11 MED ORDER — AMISULPRIDE (ANTIEMETIC) 5 MG/2ML IV SOLN: 10.0000 mg | Freq: Once | INTRAVENOUS | Status: DC | PRN

## 2021-10-11 MED ORDER — FENTANYL CITRATE (PF) 100 MCG/2ML IJ SOLN
INTRAMUSCULAR | Status: AC
  Filled 2021-10-11: qty 2

## 2021-10-11 MED ORDER — ONDANSETRON HCL 4 MG/2ML IJ SOLN
INTRAMUSCULAR | Status: AC
  Filled 2021-10-11: qty 12

## 2021-10-11 MED ORDER — EPHEDRINE 5 MG/ML INJ
INTRAVENOUS | Status: AC
  Filled 2021-10-11: qty 10

## 2021-10-11 MED ORDER — OXYCODONE HCL 5 MG PO TABS: 5.0000 mg | ORAL_TABLET | ORAL | 0 refills | Status: AC | PRN

## 2021-10-11 MED ORDER — VANCOMYCIN HCL 500 MG IV SOLR
INTRAVENOUS | Status: DC | PRN
  Administered 2021-10-11: 500 mg via TOPICAL

## 2021-10-11 MED ORDER — CEFAZOLIN SODIUM-DEXTROSE 2-4 GM/100ML-% IV SOLN
2.0000 g | INTRAVENOUS | Status: AC
  Administered 2021-10-11: 2 g via INTRAVENOUS

## 2021-10-11 MED ORDER — CEFAZOLIN SODIUM-DEXTROSE 2-4 GM/100ML-% IV SOLN
INTRAVENOUS | Status: AC
  Filled 2021-10-11: qty 100

## 2021-10-11 MED ORDER — ACETAMINOPHEN 500 MG PO TABS
1000.0000 mg | ORAL_TABLET | Freq: Once | ORAL | Status: AC
  Administered 2021-10-11: 1000 mg via ORAL

## 2021-10-11 MED ORDER — PHENYLEPHRINE 40 MCG/ML (10ML) SYRINGE FOR IV PUSH (FOR BLOOD PRESSURE SUPPORT)
PREFILLED_SYRINGE | INTRAVENOUS | Status: AC
  Filled 2021-10-11: qty 10

## 2021-10-11 SURGICAL SUPPLY — 87 items
ANCH SUT 2 JK 1.5X2.9 2 LD (Anchor) ×4 IMPLANT
ANCH SUT 4.75 LNK KNTLS STRL (Anchor) ×4 IMPLANT
ANCHOR KNTLS VENTIX 4.75 (Anchor) ×4 IMPLANT
ANCHOR SUT JK SZ 2 2.9 DBL SL (Anchor) ×4 IMPLANT
APL PRP STRL LF DISP 70% ISPRP (MISCELLANEOUS) ×2
BANDAGE ESMARK 6X9 LF (GAUZE/BANDAGES/DRESSINGS) ×2 IMPLANT
BIT DRILL JUGRKNT W/NDL BIT2.9 (DRILL) ×1 IMPLANT
BLADE MICRO SAGITTAL (BLADE) ×2 IMPLANT
BLADE SURG 15 STRL LF DISP TIS (BLADE) ×4 IMPLANT
BLADE SURG 15 STRL SS (BLADE) ×6
BNDG CMPR 9X6 STRL LF SNTH (GAUZE/BANDAGES/DRESSINGS) ×2
BNDG COHESIVE 4X5 TAN ST LF (GAUZE/BANDAGES/DRESSINGS) ×3 IMPLANT
BNDG COHESIVE 6X5 TAN ST LF (GAUZE/BANDAGES/DRESSINGS) ×3 IMPLANT
BNDG CONFORM 2 STRL LF (GAUZE/BANDAGES/DRESSINGS) IMPLANT
BNDG ESMARK 6X9 LF (GAUZE/BANDAGES/DRESSINGS) ×3
BOOT STEPPER DURA LG (SOFTGOODS) IMPLANT
BOOT STEPPER DURA MED (SOFTGOODS) IMPLANT
CANISTER SUCT 1200ML W/VALVE (MISCELLANEOUS) IMPLANT
CHLORAPREP W/TINT 26 (MISCELLANEOUS) ×3 IMPLANT
COVER BACK TABLE 60X90IN (DRAPES) ×3 IMPLANT
CUFF TOURN SGL QUICK 34 (TOURNIQUET CUFF)
CUFF TRNQT CYL 34X4.125X (TOURNIQUET CUFF) IMPLANT
DRAPE EXTREMITY T 121X128X90 (DISPOSABLE) ×3 IMPLANT
DRAPE OEC MINIVIEW 54X84 (DRAPES) IMPLANT
DRAPE U-SHAPE 47X51 STRL (DRAPES) ×3 IMPLANT
DRILL JUGGERKNOT W/NDL BIT 2.9 (DRILL) ×3
DRSG MEPITEL 4X7.2 (GAUZE/BANDAGES/DRESSINGS) ×3 IMPLANT
DRSG PAD ABDOMINAL 8X10 ST (GAUZE/BANDAGES/DRESSINGS) ×6 IMPLANT
ELECT REM PT RETURN 9FT ADLT (ELECTROSURGICAL) ×3
ELECTRODE REM PT RTRN 9FT ADLT (ELECTROSURGICAL) ×2 IMPLANT
GAUZE SPONGE 4X4 12PLY STRL (GAUZE/BANDAGES/DRESSINGS) ×3 IMPLANT
GLOVE SRG 8 PF TXTR STRL LF DI (GLOVE) ×4 IMPLANT
GLOVE SURG ENC MOIS LTX SZ8 (GLOVE) ×3 IMPLANT
GLOVE SURG LTX SZ8 (GLOVE) ×3 IMPLANT
GLOVE SURG POLYISO LF SZ7 (GLOVE) ×4 IMPLANT
GLOVE SURG UNDER POLY LF SZ7 (GLOVE) ×4 IMPLANT
GLOVE SURG UNDER POLY LF SZ8 (GLOVE) ×6
GOWN STRL REUS W/ TWL LRG LVL3 (GOWN DISPOSABLE) ×2 IMPLANT
GOWN STRL REUS W/ TWL XL LVL3 (GOWN DISPOSABLE) ×4 IMPLANT
GOWN STRL REUS W/TWL LRG LVL3 (GOWN DISPOSABLE) ×3
GOWN STRL REUS W/TWL XL LVL3 (GOWN DISPOSABLE) ×6
NDL SAFETY ECLIPSE 18X1.5 (NEEDLE) IMPLANT
NDL SUT 6 .5 CRC .975X.05 MAYO (NEEDLE) IMPLANT
NEEDLE HYPO 18GX1.5 SHARP (NEEDLE)
NEEDLE HYPO 22GX1.5 SAFETY (NEEDLE) IMPLANT
NEEDLE MAYO TAPER (NEEDLE)
NS IRRIG 1000ML POUR BTL (IV SOLUTION) ×3 IMPLANT
PACK BASIN DAY SURGERY FS (CUSTOM PROCEDURE TRAY) ×3 IMPLANT
PAD CAST 4YDX4 CTTN HI CHSV (CAST SUPPLIES) ×2 IMPLANT
PADDING CAST ABS 4INX4YD NS (CAST SUPPLIES)
PADDING CAST ABS COTTON 4X4 ST (CAST SUPPLIES) IMPLANT
PADDING CAST COTTON 4X4 STRL (CAST SUPPLIES) ×3
PADDING CAST COTTON 6X4 STRL (CAST SUPPLIES) ×3 IMPLANT
PASSER SUT SWANSON 36MM LOOP (INSTRUMENTS) IMPLANT
PENCIL SMOKE EVACUATOR (MISCELLANEOUS) ×3 IMPLANT
RETRIEVER SUT HEWSON (MISCELLANEOUS) IMPLANT
SANITIZER HAND PURELL 535ML FO (MISCELLANEOUS) ×3 IMPLANT
SHEET MEDIUM DRAPE 40X70 STRL (DRAPES) ×3 IMPLANT
SLEEVE SCD COMPRESS KNEE MED (STOCKING) ×3 IMPLANT
SPIKE FLUID TRANSFER (MISCELLANEOUS) IMPLANT
SPLINT FAST PLASTER 5X30 (CAST SUPPLIES) ×20
SPLINT PLASTER CAST FAST 5X30 (CAST SUPPLIES) ×40 IMPLANT
SPONGE T-LAP 18X18 ~~LOC~~+RFID (SPONGE) ×3 IMPLANT
STOCKINETTE 6  STRL (DRAPES) ×1
STOCKINETTE 6 STRL (DRAPES) ×2 IMPLANT
SUCTION FRAZIER HANDLE 10FR (MISCELLANEOUS) ×1
SUCTION TUBE FRAZIER 10FR DISP (MISCELLANEOUS) ×2 IMPLANT
SUT ETHIBOND 2 OS 4 DA (SUTURE) IMPLANT
SUT ETHIBOND 3-0 V-5 (SUTURE) IMPLANT
SUT ETHILON 3 0 PS 1 (SUTURE) ×3 IMPLANT
SUT FIBERWIRE #2 38 T-5 BLUE (SUTURE)
SUT MERSILENE 2.0 SH NDLE (SUTURE) IMPLANT
SUT MNCRL AB 3-0 PS2 18 (SUTURE) ×3 IMPLANT
SUT VIC AB 0 CT1 27 (SUTURE)
SUT VIC AB 0 CT1 27XBRD ANBCTR (SUTURE) IMPLANT
SUT VIC AB 1 CT1 27 (SUTURE)
SUT VIC AB 1 CT1 27XBRD ANBCTR (SUTURE) IMPLANT
SUT VIC AB 2-0 SH 18 (SUTURE) IMPLANT
SUT VIC AB 2-0 SH 27 (SUTURE)
SUT VIC AB 2-0 SH 27XBRD (SUTURE) IMPLANT
SUT VICRYL 0 SH 27 (SUTURE) ×3 IMPLANT
SUTURE FIBERWR #2 38 T-5 BLUE (SUTURE) IMPLANT
SYR BULB EAR ULCER 3OZ GRN STR (SYRINGE) ×3 IMPLANT
TOWEL GREEN STERILE FF (TOWEL DISPOSABLE) ×6 IMPLANT
TUBE CONNECTING 20X1/4 (TUBING) ×3 IMPLANT
UNDERPAD 30X36 HEAVY ABSORB (UNDERPADS AND DIAPERS) ×3 IMPLANT
YANKAUER SUCT BULB TIP NO VENT (SUCTIONS) IMPLANT

## 2021-10-11 NOTE — Anesthesia Postprocedure Evaluation (Signed)
Anesthesia Post Note ? ?Patient: [name] [name] ? ?Procedure(s) Performed: Right gastroc recession, excision of Haglund deformity, Achilles tendon debridement and reconstruction (Right: Ankle) ? ?  ? ?Patient location during evaluation: PACU ?Anesthesia Type: Regional and General ?Level of consciousness: awake ?Pain management: pain level controlled ?Vital Signs Assessment: post-procedure vital signs reviewed and stable ?Respiratory status: spontaneous breathing, nonlabored ventilation, respiratory function stable and patient connected to nasal cannula oxygen ?Cardiovascular status: blood pressure returned to baseline and stable ?Postop Assessment: no apparent nausea or vomiting ?Anesthetic complications: no ? ? ?No notable events documented. ? ?Last Vitals:  ?Vitals:  ? 10/11/21 1045 10/11/21 1103  ?BP: 124/66 131/62  ?Pulse: 83 91  ?Resp: 17 18  ?Temp:  36.4 ?C  ?SpO2: 95% 99%  ?  ?Last Pain:  ?Vitals:  ? 10/11/21 1103  ?TempSrc:   ?PainSc: 0-No pain  ? ? ?  ?  ?  ?  ?  ?  ? ?[name] [name] ? ? ? ? ?

## 2021-10-11 NOTE — H&P (Signed)
[name] [name] is an 71 y.o. female.   ?Chief Complaint: right heel pain ?HPI: 71 y/o female without significant PMH has a long h/o R heel pain due to achilles tendonitis.  She has a tight heel cord as well as insertional Achilles tendinopathy and a prominent Haglund deformity.  She has failed nonoperative treatment to date including activity modification, oral anti-inflammatories, shoewear modification and physical therapy.  She presents now for surgical treatment of this painful and limiting condition. ? ?Past Medical History:  ?Diagnosis Date  ? Allergy   ? Anxiety   ? Arthritis   ? Basal cell carcinoma   ? in vaginal area  ? Basal cell carcinoma (BCC) in situ of skin   ? Chronic kidney disease   ? Complication of anesthesia   ? Depression   ? Genital warts   ? one time a long time ago  ? Hyperlipidemia   ? Hypertension   ? controls with diet and exercise  ? Kidney stone   ? 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 no further information given.  ? PONV (postoperative nausea and vomiting)   ? Portal vein thrombosis 2015  ? Pre-diabetes   ? Shingles   ? VAIN I (vaginal intraepithelial neoplasia grade I)   ? ? ?Past Surgical History:  ?Procedure Laterality Date  ? ABDOMINAL HYSTERECTOMY  1986  ? BLADDER SURGERY    ? at the same time of hysterectomy  ? FOOT SURGERY Left 08/20/2012  ? Dr. Elvina Mattes  ? TUBAL LIGATION    ? [place] EXTRACTION  1986  ? ? ?Family History  ?Problem Relation Age of Onset  ? Parkinsonism Mother   ? Breast cancer Mother 8  ? Alcohol abuse Father   ? Lymphoma Son   ? ?Social History:  reports that she has never smoked. She has never used smokeless tobacco. She reports current alcohol use. She reports that she does not use drugs. ? ?Allergies:  ?Allergies  ?Allergen Reactions  ? Atorvastatin Other (See Comments)  ?  Muscle spasms  ? Lovastatin   ?  muscle aches  ? Statins Other (See Comments)  ? Wheat Bran Swelling  ? Zetia  [Ezetimibe]   ?  muscle aches  ? Metronidazole Nausea And Vomiting and Rash   ? ? ?Medications Prior to Admission  ?Medication Sig Dispense Refill  ? aspirin EC 81 MG tablet Take 1 tablet (81 mg total) by mouth daily. 1 tablet 0  ? Cholecalciferol (VITAMIN D3) 2000 units TABS Take 1 capsule by mouth daily.    ? meloxicam (MOBIC) 15 MG tablet Take 1 tablet by mouth daily. 30 tablet 1  ? sertraline (ZOLOFT) 100 MG tablet Take 1 tablet (100 mg total) by mouth daily. 90 tablet 1  ? ? ?No results found for this or any previous visit (from the past 48 hour(s)). ?No results found. ? ?Review of Systems no recent fever, chills, nausea, vomiting or changes in her appetite ? ?Blood pressure 116/66, pulse 74, temperature (!) 97.3 ?F (36.3 ?C), temperature source Oral, resp. rate 18, height '5\' 2"'$  (1.575 m), weight 72.2 kg, SpO2 98 %. ?Physical Exam  ?Well-nourished well-developed woman in no apparent distress.  Alert and oriented x4.  Normal mood and affect.  Extraocular motions are intact.  Gait is antalgic to the right.  Heel cord is tight on the right.  Skin is healthy and intact at the right ankle and foot.  Pulses are palpable in the foot.  Tender to palpation at the  insertion of the Achilles.  There is prominent swelling at the posterior calcaneus. ? ? ?Assessment/Plan ?Chronic right insertional Achilles tendinopathy, Haglund deformity and short Achilles tendon -to the operating room today for gastrocnemius recession, excision of the Haglund deformity and debridement and reconstruction of the Achilles tendon.  The risks and benefits of the alternative treatment options have been discussed in detail.  The patient wishes to proceed with surgery and specifically understands risks of bleeding, infection, nerve damage, blood clots, need for additional surgery, amputation and death.  ? ?Wylene Simmer, MD ?08-Nov-2021, 8:41 AM ? ? ? ?

## 2021-10-11 NOTE — Discharge Instructions (Addendum)
Regional Anesthesia Blocks ? ?1. Numbness or the inability to move the "blocked" extremity may last from 3-48 hours after placement. The length of time depends on the medication injected and your individual response to the medication. If the numbness is not going away after 48 hours, call your surgeon. ? ?2. The extremity that is blocked will need to be protected until the numbness is gone and the  Strength has returned. Because you cannot feel it, you will need to take extra care to avoid injury. Because it may be weak, you may have difficulty moving it or using it. You may not know what position it is in without looking at it while the block is in effect. ? ?3. For blocks in the legs and feet, returning to weight bearing and walking needs to be done carefully. You will need to wait until the numbness is entirely gone and the strength has returned. You should be able to move your leg and foot normally before you try and bear weight or walk. You will need someone to be with you when you first try to ensure you do not fall and possibly risk injury. ? ?4. Bruising and tenderness at the needle site are common side effects and will resolve in a few days. ? ?5. Persistent numbness or new problems with movement should be communicated to the surgeon or the [facility] [phone] [place] [phone]). s instructed by your physician ?-Make any legal decisions or sign important papers. ? ?Meals: ?Start with liquid foods such as gelatin or soup. Progress to regular foods as tolerated. Avoid greasy, spicy, heavy foods. If nausea and/or vomiting occur, drink only clear liquids until the nausea and/or vomiting subsides. Call your physician if vomiting continues. ? ?Special Instructions/Symptoms: ?Your throat may feel dry or sore from the anesthesia or the breathing tube placed in your throat during surgery. If this causes discomfort, gargle with warm salt water. The discomfort should disappear  within 24 hours. ? ?If you had a scopolamine patch placed behind your ear for the management of post- operative nausea and/or vomiting: ? ?1. The medication in the patch is effective for 72 hours, after which it should be removed.  Wrap patch in a tissue and discard in the trash. Wash hands thoroughly with soap and water. ?2. You may remove the patch earlier than 72 hours if you experience unpleasant side effects which may include dry mouth, dizziness or visual disturbances. ?3. Avoid touching the patch. Wash your hands with soap and water after contact with the patch. ?    ?Post Anesthesia Home Care Instructions ? ?Activity: ?Get plenty of rest for the remainder of the day. A responsible individual must stay with you for 24 hours following the procedure.  ?For the next 24 hours, DO NOT: ?-Drive a car ?-Paediatric nurse ?-Drink alcoholic beverages ?-Take any medication unless instructed by your physician ?-Make any legal decisions or sign important papers. ? ?Meals: ?Start with liquid foods such as gelatin or soup. Progress to regular foods as tolerated. Avoid greasy, spicy, heavy foods. If nausea and/or vomiting occur, drink only clear liquids until the nausea and/or vomiting subsides. Call your physician if vomiting continues. ? ?Special Instructions/Symptoms: ?Your throat may feel dry or sore from the anesthesia or the breathing tube placed in your throat during surgery. If this causes discomfort, gargle with warm salt water. The discomfort should disappear within 24 hours. ? ?If you had a scopolamine patch placed behind your ear for the  management of post- operative nausea and/or vomiting: ? ?1. The medication in the patch is effective for 72 hours, after which it should be removed.  Wrap patch in a tissue and discard in the trash. Wash hands thoroughly with soap and water. ?2. You may remove the patch earlier than 72 hours if you experience unpleasant side effects which may include dry mouth, dizziness or  visual disturbances. ?3. Avoid touching the patch. Wash your hands with soap and water after contact with the patch. ?    ?

## 2021-10-11 NOTE — Anesthesia Procedure Notes (Signed)
Procedure Name: Intubation ?Date/Time: 10/11/2021 8:52 AM ?Performed by: Signe Colt, CRNA ?Pre-anesthesia Checklist: Patient identified, Emergency Drugs available, Suction available and Patient being monitored ?Patient Re-evaluated:Patient Re-evaluated prior to induction ?Oxygen Delivery Method: Circle system utilized ?Preoxygenation: Pre-oxygenation with 100% oxygen ?Induction Type: IV induction ?Ventilation: Mask ventilation without difficulty ?Laryngoscope Size: Mac and 3 ?Grade View: Grade I ?Tube type: Oral ?Tube size: 7.0 mm ?Number of attempts: 1 ?Airway Equipment and Method: Stylet and Oral airway ?Placement Confirmation: ETT inserted through vocal cords under direct vision, positive ETCO2 and breath sounds checked- equal and bilateral ?Secured at: 21 cm ?Tube secured with: Tape ?Dental Injury: Teeth and Oropharynx as per pre-operative assessment  ? ? ? ? ?

## 2021-10-11 NOTE — Op Note (Signed)
10/11/2021 ? ?10:13 AM ? ?PATIENT:  [name] [name]  71 y.o. female ? ?PRE-OPERATIVE DIAGNOSIS:  1.  Right insertional Achilles tendinopathy ?2.  Short right Achilles tendon ?3.  Right calcaneus Haglund deformity ? ?POST-OPERATIVE DIAGNOSIS: Same ? ?Procedure(s): ?1.  Right gastroc recession through separate incision ?2.  excision of right calcaneus Haglund deformity ?3.  Right Achilles tendon debridement and reconstruction ? ?SURGEON:  Wylene Simmer, MD ? ?ASSISTANT: Mechele Claude, PA-C ? ?ANESTHESIA:   General, regional ? ?EBL:  minimal  ? ?TOURNIQUET:   ?Total Tourniquet Time Documented: ?Thigh (Right) - 64 minutes ?Total: Thigh (Right) - 64 minutes ? ?COMPLICATIONS:  None apparent ? ?DISPOSITION:  Extubated, awake and stable to recovery. ? ?INDICATION FOR PROCEDURE: The patient is a 71 year old female with a long history of right heel pain due to insertional Achilles tendinopathy.  She has a short Achilles and a prominent Haglund deformity as well.  She has failed nonoperative treatment to date including activity modification, oral anti-inflammatories, shoewear modification and physical therapy.  She presents today for surgical treatment of this painful and limiting condition. ? ?PROCEDURE IN DETAIL: After preoperative consent was obtained and the correct operative site was identified, the patient was brought the operating room supine on stretcher.  General anesthesia was induced.  Preoperative antibiotics were administered.  A surgical timeout was taken.  The right lower extremity was exsanguinated and a thigh tourniquet inflated to 250 mmHg.  The patient was then turned into the prone position on the operating table with all bony prominences padded well.  The right lower extremity was prepped and draped in standard sterile fashion.  A longitudinal incision was made over the calf.  Dissection was carried sharply down through the subcutaneous tissues.  Care was taken to protect the sural nerve and lesser saphenous  vein.  The gastrocnemius tendon was identified.  It was divided in its entirety under direct vision.  The wound was irrigated and sprinkled with vancomycin powder.  Subcutaneous tissues were approximated with Monocryl.  Skin incision was closed with nylon. ? ?Attention was turned to the posterior heel where a longitudinal incision was made.  Sharp dissection was carried down through the subcutaneous tissue and peritenon creating full-thickness flaps medially and laterally.  The Achilles tendon was then split longitudinally and was released from the calcaneus insertion medially and laterally.  The tendon was debrided of all degenerated and calcified tissue.  The oscillating saw was then used to resect the prominent enthesophyte and Haglund deformity.  The tendon was repaired to bone with 2 juggernaut and 2 Ventix anchors with an hourglass pattern of nonabsorbable suture.  The ankle can dorsiflex to neutral passively with no gapping at the tendon repair site.  The wound was irrigated copiously.  The split in the tendon was repaired with an imbricating suture of 0 Vicryl.  The wound was sprinkled with vancomycin powder.  Subcutaneous tissues were approximated with Vicryl.  The skin incision was closed with nylon.  Sterile dressings were applied followed by a well-padded short leg splint with the ankle in gravity equinus.  The tourniquet was released after application of the dressings.  The patient was awakened from anesthesia and transported to the recovery room in stable condition. ? ? ?FOLLOW UP PLAN: Nonweightbearing on the right lower extremity.  Follow-up in the office in 2 weeks for suture removal and conversion to a cam boot with 2 or 3 heel lifts to begin early range of motion and weightbearing.  Aspirin for DVT prophylaxis. ? ?  Mechele Claude PA-C was present and scrubbed for the duration of the operative case. His assistance was essential in positioning the patient, prepping and draping, gaining and maintaining  exposure, performing the operation, closing and dressing the wounds and applying the splint. ? ?  ?

## 2021-10-11 NOTE — Progress Notes (Signed)
Assisted Dr. Roanna Banning with right, ultrasound guided, popliteal block. Side rails up, monitors on throughout procedure. See vital signs in flow sheet. Tolerated Procedure well. ?

## 2021-10-11 NOTE — Transfer of Care (Signed)
Immediate Anesthesia Transfer of Care Note ? ?Patient: [name] [name] ? ?Procedure(s) Performed: Right gastroc recession, excision of Haglund deformity, Achilles tendon debridement and reconstruction (Right: Ankle) ? ?Patient Location: PACU ? ?Anesthesia Type:GA combined with regional for post-op pain ? ?Level of Consciousness: drowsy and patient cooperative ? ?Airway & Oxygen Therapy: Patient Spontanous Breathing and Patient connected to face mask oxygen ? ?Post-op Assessment: Report given to RN and Post -op Vital signs reviewed and stable ? ?Post vital signs: Reviewed and stable ? ?Last Vitals:  ?Vitals Value Taken Time  ?BP    ?Temp    ?Pulse 84 10/11/21 1016  ?Resp    ?SpO2 100 % 10/11/21 1016  ?Vitals shown include unvalidated device data. ? ?Last Pain:  ?Vitals:  ? 10/11/21 0725  ?TempSrc: Oral  ?PainSc: 0-No pain  ?   ? ?  ? ?Complications: No notable events documented. ?

## 2021-10-11 NOTE — Anesthesia Procedure Notes (Signed)
Anesthesia Regional Block: Popliteal block  ? ?Pre-Anesthetic Checklist: , timeout performed,  Correct Patient, Correct Site, Correct Laterality,  Correct Procedure, Correct Position, site marked,  Risks and benefits discussed,  Surgical consent,  Pre-op evaluation,  At surgeon's request and post-op pain management ? ?Laterality: Right ? ?Prep: chloraprep     ?  ?Needles:  ?Injection technique: Single-shot ? ?Needle Type: Echogenic Stimulator Needle   ? ? ?Needle Length: 9cm  ?Needle Gauge: 21  ? ? ? ?Additional Needles: ? ? ?Procedures:,,,, ultrasound used (permanent image in chart),,    ?Narrative:  ?Start time: 10/11/2021 8:00 AM ?End time: 10/11/2021 8:10 AM ?Injection made incrementally with aspirations every 5 mL. ? ?Performed by: Personally  ?Anesthesiologist: Murvin Natal, MD ? ?Additional Notes: ?Functioning IV was confirmed and monitors were applied.  A timeout was performed. Sterile prep, hand hygiene and sterile gloves were used. A 73m 21ga Arrow echogenic stimulator needle was used. Negative aspiration and negative test dose prior to incremental administration of local anesthetic. The patient tolerated the procedure well. ? ?Ultrasound guidance: relevent anatomy identified, needle position confirmed, local anesthetic spread visualized around nerve(s), vascular puncture avoided.  Image printed for medical record.  ? ? ? ? ? ?

## 2021-10-12 ENCOUNTER — Encounter (HOSPITAL_BASED_OUTPATIENT_CLINIC_OR_DEPARTMENT_OTHER): Payer: Self-pay | Admitting: Orthopedic Surgery

## 2021-10-24 DIAGNOSIS — M67873 Other specified disorders of tendon, right ankle and foot: Secondary | ICD-10-CM | POA: Diagnosis not present

## 2021-10-29 ENCOUNTER — Other Ambulatory Visit (HOSPITAL_COMMUNITY): Payer: Self-pay

## 2021-11-26 DIAGNOSIS — M25571 Pain in right ankle and joints of right foot: Secondary | ICD-10-CM | POA: Diagnosis not present

## 2021-11-26 DIAGNOSIS — M25671 Stiffness of right ankle, not elsewhere classified: Secondary | ICD-10-CM | POA: Diagnosis not present

## 2021-11-28 LAB — FECAL OCCULT BLOOD, IMMUNOCHEMICAL: IFOBT: NEGATIVE

## 2021-11-28 LAB — FECAL OCCULT BLOOD, GUAIAC: Fecal Occult Blood: NEGATIVE

## 2021-12-03 ENCOUNTER — Other Ambulatory Visit: Payer: Self-pay | Admitting: Family Medicine

## 2021-12-03 DIAGNOSIS — M25571 Pain in right ankle and joints of right foot: Secondary | ICD-10-CM | POA: Diagnosis not present

## 2021-12-03 DIAGNOSIS — M25671 Stiffness of right ankle, not elsewhere classified: Secondary | ICD-10-CM | POA: Diagnosis not present

## 2021-12-04 ENCOUNTER — Other Ambulatory Visit (HOSPITAL_COMMUNITY): Payer: Self-pay

## 2021-12-04 MED ORDER — MELOXICAM 15 MG PO TABS
15.0000 mg | ORAL_TABLET | Freq: Every day | ORAL | 1 refills | Status: DC
  Filled 2021-12-04: qty 30, 30d supply, fill #0
  Filled 2022-01-04: qty 30, 30d supply, fill #1

## 2021-12-04 NOTE — Telephone Encounter (Signed)
Requested medication (s) are due for refill today:   Yes ? ?Requested medication (s) are on the active medication list:   Yes ? ?Future visit scheduled:   Yes ? ? ?Last ordered: 09/14/2021 #30, 1 refill ? ?Returned because per protocol labs are overdue.   ? ?Requested Prescriptions  ?Pending Prescriptions Disp Refills  ? meloxicam (MOBIC) 15 MG tablet 30 tablet 1  ?  Sig: Take 1 tablet by mouth daily.  ?  ? Analgesics:  COX2 Inhibitors Failed - 12/03/2021  1:39 PM  ?  ?  Failed - Manual Review: Labs are only required if the patient has taken medication for more than 8 weeks.  ?  ?  Failed - HGB in normal range and within 360 days  ?  Hemoglobin  ?Date Value Ref Range Status  ?03/24/2020 14.7 11.1 - 15.9 g/dL Final  ?  ?  ?  ?  Failed - Cr in normal range and within 360 days  ?  Creatinine  ?Date Value Ref Range Status  ?10/30/2014 1.16 (H) mg/dL Final  ?  Comment:  ?  0.44-1.00 ?NOTE: New Reference Range ? 10/04/14 ?  ? ?Creatinine, Ser  ?Date Value Ref Range Status  ?10/05/2021 1.18 (H) 0.44 - 1.00 mg/dL Final  ?  ?  ?  ?  Failed - HCT in normal range and within 360 days  ?  Hematocrit  ?Date Value Ref Range Status  ?03/24/2020 43.2 34.0 - 46.6 % Final  ?  ?  ?  ?  Passed - AST in normal range and within 360 days  ?  AST  ?Date Value Ref Range Status  ?07/06/2021 20 0 - 40 IU/L Final  ? ?SGOT(AST)  ?Date Value Ref Range Status  ?10/29/2014 58 (H) U/L Final  ?  Comment:  ?  15-41 ?NOTE: New Reference Range ? 10/04/14 ?  ?  ?  ?  ?  Passed - ALT in normal range and within 360 days  ?  ALT  ?Date Value Ref Range Status  ?07/06/2021 23 0 - 32 IU/L Final  ? ?SGPT (ALT)  ?Date Value Ref Range Status  ?10/29/2014 75 (H) U/L Final  ?  Comment:  ?  14-54 ?NOTE: New Reference Range ? 10/04/14 ?  ?  ?  ?  ?  Passed - eGFR is 30 or above and within 360 days  ?  EGFR (African American)  ?Date Value Ref Range Status  ?10/30/2014 58 (L)  Final  ? ?GFR calc Af Amer  ?Date Value Ref Range Status  ?03/24/2020 60 >59 mL/min/1.73 Final   ?  Comment:  ?  **[facility] currently reports eGFR in compliance with the current** ?  recommendations of the National Kidney Foundation. [facility] will ?  update reporting as new guidelines are published from the NKF-ASN ?  Task force. ?  ? ?EGFR (Non-African Amer.)  ?Date Value Ref Range Status  ?10/30/2014 50 (L)  Final  ?  Comment:  ?  eGFR values <60mL/min/1.73 m2 may be an indication of chronic ?kidney disease (CKD). ?Calculated eGFR is useful in patients with stable renal function. ?The eGFR calculation will not be reliable in acutely ill patients ?when serum creatinine is changing rapidly. It is not useful in ?patients on dialysis. The eGFR calculation may not be applicable ?to patients at the low and high extremes of body sizes, pregnant ?women, and vegetarians. ?  ? ?GFR, Estimated  ?Date Value Ref Range Status  ?10/05/2021 50 (L) >60 mL/min   Final  ?  Comment:  ?  (NOTE) ?Calculated using the CKD-EPI Creatinine Equation (2021) ?  ? ?eGFR  ?Date Value Ref Range Status  ?07/06/2021 45 (L) >59 mL/min/1.73 Final  ?  ?  ?  ?  Passed - Patient is not pregnant  ?  ?  Passed - Valid encounter within last 12 months  ?  Recent Outpatient Visits   ? ?      ? 5 months ago Hypertension associated with diabetes (HCC)  ? [facility] Bacigalupo, Angela M, MD  ? 1 year ago Annual physical exam  ? [facility] Burnette, Jennifer M, PA-C  ? 2 years ago Annual physical exam  ? [facility] Burnette, Jennifer M, PA-C  ? 3 years ago Annual physical exam  ? [facility] Burnette, Jennifer M, PA-C  ? 4 years ago Laryngitis  ? [facility] Burnette, Jennifer M, PA-C  ? ?  ?  ?Future Appointments   ? ?        ? In 1 year  [facility]  ? In 1 year Payne, Elise T, FNP [facility]  ? ?  ? ? ?  ?  ?  ? ?

## 2021-12-06 DIAGNOSIS — M25571 Pain in right ankle and joints of right foot: Secondary | ICD-10-CM | POA: Diagnosis not present

## 2021-12-06 DIAGNOSIS — M25671 Stiffness of right ankle, not elsewhere classified: Secondary | ICD-10-CM | POA: Diagnosis not present

## 2021-12-11 DIAGNOSIS — M25671 Stiffness of right ankle, not elsewhere classified: Secondary | ICD-10-CM | POA: Diagnosis not present

## 2021-12-11 DIAGNOSIS — M25571 Pain in right ankle and joints of right foot: Secondary | ICD-10-CM | POA: Diagnosis not present

## 2021-12-14 DIAGNOSIS — M25571 Pain in right ankle and joints of right foot: Secondary | ICD-10-CM | POA: Diagnosis not present

## 2021-12-14 DIAGNOSIS — M25671 Stiffness of right ankle, not elsewhere classified: Secondary | ICD-10-CM | POA: Diagnosis not present

## 2021-12-20 DIAGNOSIS — M25671 Stiffness of right ankle, not elsewhere classified: Secondary | ICD-10-CM | POA: Diagnosis not present

## 2021-12-20 DIAGNOSIS — M25571 Pain in right ankle and joints of right foot: Secondary | ICD-10-CM | POA: Diagnosis not present

## 2021-12-26 DIAGNOSIS — M25571 Pain in right ankle and joints of right foot: Secondary | ICD-10-CM | POA: Diagnosis not present

## 2021-12-26 DIAGNOSIS — M25671 Stiffness of right ankle, not elsewhere classified: Secondary | ICD-10-CM | POA: Diagnosis not present

## 2021-12-27 DIAGNOSIS — M25571 Pain in right ankle and joints of right foot: Secondary | ICD-10-CM | POA: Diagnosis not present

## 2021-12-27 DIAGNOSIS — M25671 Stiffness of right ankle, not elsewhere classified: Secondary | ICD-10-CM | POA: Diagnosis not present

## 2021-12-30 ENCOUNTER — Other Ambulatory Visit: Payer: Self-pay | Admitting: Family Medicine

## 2021-12-30 DIAGNOSIS — F32A Depression, unspecified: Secondary | ICD-10-CM

## 2022-01-04 ENCOUNTER — Other Ambulatory Visit (HOSPITAL_COMMUNITY): Payer: Self-pay

## 2022-01-07 ENCOUNTER — Ambulatory Visit (INDEPENDENT_AMBULATORY_CARE_PROVIDER_SITE_OTHER): Payer: Medicare PPO | Admitting: Family Medicine

## 2022-01-07 ENCOUNTER — Encounter: Payer: Self-pay | Admitting: Family Medicine

## 2022-01-07 VITALS — BP 138/84 | HR 74 | Temp 98.4°F | Resp 16 | Ht 62.0 in | Wt 164.4 lb

## 2022-01-07 DIAGNOSIS — E1122 Type 2 diabetes mellitus with diabetic chronic kidney disease: Secondary | ICD-10-CM | POA: Diagnosis not present

## 2022-01-07 DIAGNOSIS — Z1211 Encounter for screening for malignant neoplasm of colon: Secondary | ICD-10-CM

## 2022-01-07 DIAGNOSIS — E1169 Type 2 diabetes mellitus with other specified complication: Secondary | ICD-10-CM | POA: Diagnosis not present

## 2022-01-07 DIAGNOSIS — Z1231 Encounter for screening mammogram for malignant neoplasm of breast: Secondary | ICD-10-CM

## 2022-01-07 DIAGNOSIS — F332 Major depressive disorder, recurrent severe without psychotic features: Secondary | ICD-10-CM | POA: Diagnosis not present

## 2022-01-07 DIAGNOSIS — R7989 Other specified abnormal findings of blood chemistry: Secondary | ICD-10-CM

## 2022-01-07 DIAGNOSIS — I152 Hypertension secondary to endocrine disorders: Secondary | ICD-10-CM

## 2022-01-07 DIAGNOSIS — E559 Vitamin D deficiency, unspecified: Secondary | ICD-10-CM | POA: Diagnosis not present

## 2022-01-07 DIAGNOSIS — Z2821 Immunization not carried out because of patient refusal: Secondary | ICD-10-CM

## 2022-01-07 DIAGNOSIS — N1831 Chronic kidney disease, stage 3a: Secondary | ICD-10-CM | POA: Diagnosis not present

## 2022-01-07 DIAGNOSIS — F330 Major depressive disorder, recurrent, mild: Secondary | ICD-10-CM

## 2022-01-07 DIAGNOSIS — Z1159 Encounter for screening for other viral diseases: Secondary | ICD-10-CM

## 2022-01-07 DIAGNOSIS — E1159 Type 2 diabetes mellitus with other circulatory complications: Secondary | ICD-10-CM | POA: Diagnosis not present

## 2022-01-07 DIAGNOSIS — E785 Hyperlipidemia, unspecified: Secondary | ICD-10-CM | POA: Diagnosis not present

## 2022-01-07 DIAGNOSIS — Z0000 Encounter for general adult medical examination without abnormal findings: Secondary | ICD-10-CM

## 2022-01-07 DIAGNOSIS — Z23 Encounter for immunization: Secondary | ICD-10-CM | POA: Insufficient documentation

## 2022-01-07 DIAGNOSIS — M159 Polyosteoarthritis, unspecified: Secondary | ICD-10-CM

## 2022-01-07 MED ORDER — MELOXICAM 15 MG PO TABS: 15.0000 mg | ORAL_TABLET | Freq: Every day | ORAL | 1 refills | Status: DC

## 2022-01-07 MED ORDER — SERTRALINE HCL 100 MG PO TABS: 100.0000 mg | ORAL_TABLET | Freq: Every day | ORAL | 1 refills | Status: DC

## 2022-01-07 NOTE — Assessment & Plan Note (Signed)
Chronic, stable with diet and exercise Reports UTD on dental and eye appts Last A1c was 6.2%; repeat microalbumin given previously elevated at 42 Sees nephrology annually  Continue to recommend balanced, lower carb meals. Smaller meal size, adding snacks. Choosing water as drink of choice and increasing purposeful exercise.

## 2022-01-07 NOTE — Assessment & Plan Note (Signed)
Chronic, stable Wishes to continue Mobic 15 mg QD PT would like to continue treatment as benefit determined to outweigh risk.

## 2022-01-07 NOTE — Progress Notes (Signed)
Complete physical exam   Patient: [name] [name]   DOB: [birth year]   71 y.o. Female  MRN: [id number] Visit Date: 01/07/2022  Today's healthcare provider: Gwyneth Sprout, FNP  Patient presents for new patient visit to establish care.  Introduced to Designer, jewellery role and practice setting.  All questions answered.  Discussed provider/patient relationship and expectations.   I,Tiffany J Bragg,acting as a scribe for Gwyneth Sprout, FNP.,have documented all relevant documentation on the behalf of Gwyneth Sprout, FNP,as directed by  Gwyneth Sprout, FNP while in the presence of Gwyneth Sprout, FNP.   Chief Complaint  Patient presents with   Annual Exam   Subjective    [name] [name] is a 71 y.o. female who presents today for a complete physical exam.  She reports consuming a general diet.  Exercise includes housework and gardening.  She generally feels well. She reports sleeping poorly. She does not have additional problems to discuss today.  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   PONV (postoperative nausea and vomiting)    Portal vein thrombosis 2015   Pre-diabetes    Shingles    VAIN I (vaginal intraepithelial neoplasia grade I)    Past Surgical History:  Procedure Laterality Date   ABDOMINAL HYSTERECTOMY  1986   BLADDER SURGERY     at the same time of hysterectomy   EXCISION HAGLUND'S DEFORMITY WITH ACHILLES TENDON REPAIR Right 10/11/2021   Procedure: Right gastroc recession, excision of Haglund deformity, Achilles tendon debridement and reconstruction;  Surgeon: Wylene Simmer,  MD;  Location: [facility];  Service: Orthopedics;  Laterality: Right;   FOOT SURGERY Left 08/20/2012   Dr. Elvina Mattes   TUBAL LIGATION     WISDOM TOOTH EXTRACTION  1986   Social History   Socioeconomic History   Marital status: Married    Spouse name: Remo Lipps   Number of children: 3   Years of education: 4 years college   Highest education level: Bachelor's degree (e.g., BA, AB, BS)  Occupational History   Occupation: retired Pharmacist, hospital   Occupation: book Visual merchandiser    Comment: part time  Tobacco Use   Smoking status: Never   Smokeless tobacco: Never  Vaping Use   Vaping Use: Never used  Substance and Sexual Activity   Alcohol use: Yes    Comment: rare   Drug use: No   Sexual activity: Not Currently    Birth control/protection: Surgical  Other Topics Concern   Not on file  Social History Narrative   Not on file   Social Determinants of Health   Financial Resource Strain: Not on file  Food Insecurity: Not on file  Transportation Needs: Not on file  Physical Activity: Inactive (01/27/2020)   Exercise Vital Sign    Days of Exercise per Week: 0 days    Minutes of Exercise per Session: 0 min  Stress: Not on file  Social Connections: Moderately Isolated (01/27/2020)   Social Connection and Isolation Panel [NHANES]    Frequency of Communication with Friends and Family: More than three times a week    Frequency of Social Gatherings with Friends and Family: More than three times a week    Attends Religious Services: Never    Marine scientist or Organizations: No    Attends Archivist Meetings: Never    Marital Status: Married  Human resources officer Violence: Not on file   Family Status  Relation Name Status   Mother  Alive   Father  Deceased at age 9       MI   Brother  Alive   Son  (Not Specified)   Family History  Problem Relation Age of Onset   Parkinsonism Mother    Breast cancer Mother 18   Alcohol abuse Father    Lymphoma Son    Allergies   Allergen Reactions   Atorvastatin Other (See Comments)    Muscle spasms   Lovastatin     muscle aches   Statins Other (See Comments)   Wheat Bran Swelling   Zetia  [Ezetimibe]     muscle aches   Metronidazole Nausea And Vomiting and Rash    Patient Care Team: Gwyneth Sprout, FNP as PCP - General (Family Medicine) Delice Lesch, [place] (Inactive) as Consulting Physician Arelia Sneddon, [place] as Consulting Physician (Optometry) Merril Abbe, MD as Referring Physician (Dermatology) Billey Co, MD as Consulting Physician (Urology)   Medications: Outpatient Medications Prior to Visit  Medication Sig   aspirin EC 81 MG tablet Take 1 tablet (81 mg total) by mouth 2 (two) times daily.   Cholecalciferol (VITAMIN D3) 2000 units TABS Take 1 capsule by mouth daily.   [DISCONTINUED] docusate sodium (COLACE) 100 MG capsule Take 1 capsule (100 mg total) by mouth 2 (two) times daily. While taking narcotic pain medicine.   [DISCONTINUED] meloxicam (MOBIC) 15 MG tablet Take 1 tablet by mouth daily.   [DISCONTINUED] senna (SENOKOT) 8.6 MG TABS tablet Take 2 tablets (17.2 mg total) by mouth 2 (two) times daily.   [DISCONTINUED] sertraline (ZOLOFT) 100 MG tablet Take 1 tablet (100 mg total) by mouth daily. Please schedule office visit before any future refill.   No facility-administered medications prior to visit.    Review of Systems  Musculoskeletal:  Positive for arthralgias.  Hematological:  Bruises/bleeds easily.    Last CBC Lab Results  Component Value Date   WBC 6.4 03/24/2020   HGB 14.7 03/24/2020   HCT 43.2 03/24/2020   MCV 88 03/24/2020   MCH 30.0 03/24/2020   RDW 14.6 03/24/2020   PLT 233 24/40/1027   Last metabolic panel Lab Results  Component Value Date   GLUCOSE 89 10/05/2021   NA 139 10/05/2021   K 4.6 10/05/2021   CL 106 10/05/2021   CO2 25 10/05/2021   BUN 23 10/05/2021   CREATININE 1.18 (H) 10/05/2021   GFRNONAA 50 (L) 10/05/2021   CALCIUM 9.6  10/05/2021   PROT 6.7 07/06/2021   ALBUMIN 4.4 07/06/2021   LABGLOB 2.3 07/06/2021   AGRATIO 1.9 07/06/2021   BILITOT 0.4 07/06/2021   ALKPHOS 99 07/06/2021   AST 20 07/06/2021   ALT 23 07/06/2021   ANIONGAP 8 10/05/2021   Last lipids Lab Results  Component Value Date   CHOL 225 (H) 07/06/2021   HDL 46 07/06/2021   LDLCALC 159 (H) 07/06/2021   TRIG 112 07/06/2021   CHOLHDL 4.9 (  H) 07/06/2021   Last hemoglobin A1c Lab Results  Component Value Date   HGBA1C 6.2 (A) 07/06/2021   Last thyroid functions Lab Results  Component Value Date   TSH 2.590 03/24/2020   Last vitamin D Lab Results  Component Value Date   VD25OH 75.0 07/06/2021      Objective     BP 138/84 (BP Location: Right Arm, Patient Position: Sitting, Cuff Size: Normal)   Pulse 74   Temp 98.4 F (36.9 C) (Oral)   Resp 16   Ht '5\' 2"'$  (1.575 m)   Wt 164 lb 6.4 oz (74.6 kg)   SpO2 99%   BMI 30.07 kg/m  BP Readings from Last 3 Encounters:  01/07/22 138/84  10/11/21 131/62  07/06/21 119/60   Wt Readings from Last 3 Encounters:  01/07/22 164 lb 6.4 oz (74.6 kg)  10/11/21 159 lb 2.8 oz (72.2 kg)  07/06/21 164 lb (74.4 kg)   SpO2 Readings from Last 3 Encounters:  01/07/22 99%  10/11/21 99%  07/06/21 99%       Physical Exam Vitals and nursing note reviewed.  Constitutional:      General: She is awake. She is not in acute distress.    Appearance: Normal appearance. She is well-developed and well-groomed. She is obese. She is not ill-appearing, toxic-appearing or diaphoretic.  HENT:     Head: Normocephalic and atraumatic.     Jaw: There is normal jaw occlusion. No trismus, tenderness, swelling or pain on movement.     Right Ear: Hearing, tympanic membrane, ear canal and external ear normal. There is no impacted cerumen.     Left Ear: Hearing, tympanic membrane, ear canal and external ear normal. There is no impacted cerumen.     Nose: Nose normal. No congestion or rhinorrhea.     Right  Turbinates: Not enlarged, swollen or pale.     Left Turbinates: Not enlarged, swollen or pale.     Right Sinus: No maxillary sinus tenderness or frontal sinus tenderness.     Left Sinus: No maxillary sinus tenderness or frontal sinus tenderness.     Mouth/Throat:     Lips: Pink.     Mouth: Mucous membranes are moist. No injury.     Tongue: No lesions.     Pharynx: Oropharynx is clear. Uvula midline. No pharyngeal swelling, oropharyngeal exudate, posterior oropharyngeal erythema or uvula swelling.     Tonsils: No tonsillar exudate or tonsillar abscesses.  Eyes:     General: Lids are normal. Lids are everted, no foreign bodies appreciated. Vision grossly intact. Gaze aligned appropriately. No allergic shiner or visual field deficit.       Right eye: No discharge.        Left eye: No discharge.     Extraocular Movements: Extraocular movements intact.     Conjunctiva/sclera: Conjunctivae normal.     Right eye: Right conjunctiva is not injected. No exudate.    Left eye: Left conjunctiva is not injected. No exudate.    Pupils: Pupils are equal, round, and reactive to light.  Neck:     Thyroid: No thyroid mass, thyromegaly or thyroid tenderness.     Vascular: No carotid bruit.     Trachea: Trachea normal.  Cardiovascular:     Rate and Rhythm: Normal rate and regular rhythm.     Pulses: Normal pulses.          Carotid pulses are 2+ on the right side and 2+ on the left side.  Radial pulses are 2+ on the right side and 2+ on the left side.       Dorsalis pedis pulses are 2+ on the right side and 2+ on the left side.       Posterior tibial pulses are 2+ on the right side and 2+ on the left side.     Heart sounds: Normal heart sounds, S1 normal and S2 normal. No murmur heard.    No friction rub. No gallop.  Pulmonary:     Effort: Pulmonary effort is normal. No respiratory distress.     Breath sounds: Normal breath sounds and air entry. No stridor. No wheezing, rhonchi or rales.  Chest:      Chest wall: No tenderness.     Comments: Breasts: risk and benefit of breast self-exam was discussed, not examined  Abdominal:     General: Abdomen is flat. Bowel sounds are normal. There is no distension.     Palpations: Abdomen is soft. There is no mass.     Tenderness: There is no abdominal tenderness. There is no right CVA tenderness, left CVA tenderness, guarding or rebound.     Hernia: No hernia is present.  Genitourinary:    Comments: Exam deferred; denies complaints Musculoskeletal:        General: No swelling, tenderness, deformity or signs of injury. Normal range of motion.     Cervical back: Full passive range of motion without pain, normal range of motion and neck supple. No edema, rigidity or tenderness. No muscular tenderness.     Right lower leg: No edema.     Left lower leg: No edema.  Lymphadenopathy:     Cervical: No cervical adenopathy.     Right cervical: No superficial, deep or posterior cervical adenopathy.    Left cervical: No superficial, deep or posterior cervical adenopathy.  Skin:    General: Skin is warm and dry.     Capillary Refill: Capillary refill takes less than 2 seconds.     Coloration: Skin is not jaundiced or pale.     Findings: No bruising, erythema, lesion or rash.  Neurological:     General: No focal deficit present.     Mental Status: She is alert and oriented to person, place, and time. Mental status is at baseline.     GCS: GCS eye subscore is 4. GCS verbal subscore is 5. GCS motor subscore is 6.     Sensory: Sensation is intact. No sensory deficit.     Motor: Motor function is intact. No weakness.     Coordination: Coordination is intact. Coordination normal.     Gait: Gait is intact. Gait normal.  Psychiatric:        Attention and Perception: Attention and perception normal.        Mood and Affect: Mood and affect normal.        Speech: Speech normal.        Behavior: Behavior normal. Behavior is cooperative.        Thought Content:  Thought content normal.        Cognition and Memory: Cognition and memory normal.        Judgment: Judgment normal.      Last depression screening scores    01/07/2022    8:28 AM 07/06/2021    8:55 AM 03/24/2020    9:07 AM  PHQ 2/9 Scores  PHQ - 2 Score 0 0 0  PHQ- 9 Score 1 0 1   Last fall risk screening  01/07/2022    8:28 AM  Fall Risk   Falls in the past year? 0  Number falls in past yr: 0  Injury with Fall? 0   Last Audit-C alcohol use screening    01/07/2022    8:28 AM  Alcohol Use Disorder Test (AUDIT)  1. How often do you have a drink containing alcohol? 0  2. How many drinks containing alcohol do you have on a typical day when you are drinking? 0  3. How often do you have six or more drinks on one occasion? 0  AUDIT-C Score 0   A score of 3 or more in women, and 4 or more in men indicates increased risk for alcohol abuse, EXCEPT if all of the points are from question 1   No results found for any visits on 01/07/22.  Assessment & Plan    Routine Health Maintenance and Physical Exam  Exercise Activities and Dietary recommendations  Goals      DIET - INCREASE WATER INTAKE     Recommend to drink at least 6-8 8oz glasses of water per day.        Immunization History  Administered Date(s) Administered   PFIZER(Purple Top)SARS-COV-2 Vaccination 10/20/2019, 11/10/2019   Pfizer Covid-19 Vaccine Bivalent Booster 77yr & up 05/22/2020   Td 12/14/2000   Tdap 10/07/2012   Zoster Recombinat (Shingrix) 01/22/2021, 05/11/2021    Health Maintenance  Topic Date Due   Hepatitis C Screening  Never done   Pneumonia Vaccine 70 Years old (1 - PCV) Never done   COVID-19 Vaccine (4 - Pfizer series) 09/22/2020   HEMOGLOBIN A1C  01/04/2022   MAMMOGRAM  02/09/2022   INFLUENZA VACCINE  02/26/2022   OPHTHALMOLOGY EXAM  04/04/2022   DEXA SCAN  05/06/2022   FOOT EXAM  07/06/2022   URINE MICROALBUMIN  07/06/2022   TETANUS/TDAP  10/08/2022   COLON CANCER SCREENING  ANNUAL FOBT  11/29/2022   Zoster Vaccines- Shingrix  Completed   HPV VACCINES  Aged Out   Fecal DNA (Cologuard)  Discontinued    Discussed health benefits of physical activity, and encouraged her to engage in regular exercise appropriate for her age and condition.  Problem List Items Addressed This Visit       Cardiovascular and Mediastinum   Hypertension associated with diabetes ([place]    Chronic, borderline elevated; goal <130/<80 with co-history of diabetes  Denies Chest Pain or Chest Pressure Denies Shortness of Breath/ Dyspnea on Exertion Denies low blood pressure/hypotension Denies vision changes No LE Edema noted on exam; however, non-pitting edema in R ankle s/p surgery in 09/2021, continues to do Physical Therapy  Continue medication, Aspirin 81 mg Daily Reports easily bleeding with injuries; not interested in alternative medications at this time Return to Clinic in 6 months for chronic disease management  Seek emergent care if you develop chest pain or chest pressure       Relevant Orders   CBC with Differential/Platelet   Comprehensive metabolic panel   Lipid panel     Endocrine   Hyperlipidemia associated with type 2 diabetes mellitus (HCC)    Chronic, elevated I recommend diet low in saturated fat and regular exercise - 30 min at least 5 times per week Has tried multiple statins with poor tolerance ASCVD risk high; will send to lipid clinic for injection assistance The 10-year ASCVD risk score (Arnett DK, et al., 2019) is: 21.4%   Values used to calculate the score:     Age: [age]years  Sex: Female     Is Non-Hispanic African American: No     Diabetic: Yes     Tobacco smoker: No     Systolic Blood Pressure: 454 mmHg     Is BP treated: No     HDL Cholesterol: 46 mg/dL     Total Cholesterol: 225 mg/dL       Relevant Orders   Comprehensive metabolic panel   Lipid panel   Type 2 diabetes mellitus with stage 3a chronic kidney disease, without long-term  current use of insulin (HCC)    Chronic, stable with diet and exercise Reports UTD on dental and eye appts Last A1c was 6.2%; repeat microalbumin given previously elevated at 55 Sees nephrology annually  Continue to recommend balanced, lower carb meals. Smaller meal size, adding snacks. Choosing water as drink of choice and increasing purposeful exercise.       Relevant Orders   CBC with Differential/Platelet   Comprehensive metabolic panel   Lipid panel   Hemoglobin A1c   Urine Microalbumin w/creat. ratio     Musculoskeletal and Integument   Primary osteoarthritis involving multiple joints    Chronic, stable Wishes to continue Mobic 15 mg QD PT would like to continue treatment as benefit determined to outweigh risk.         Relevant Medications   meloxicam (MOBIC) 15 MG tablet     Other   Annual physical exam    Things to do to keep yourself healthy  - Exercise at least 30-45 minutes a day, 3-4 days a week.  - Eat a low-fat diet with lots of fruits and vegetables, up to 7-9 servings per day.  - Seatbelts can save your life. Wear them always.  - Smoke detectors on every level of your home, check batteries every year.  - Eye Doctor - have an eye exam every 1-2 years  - Safe sex - if you may be exposed to STDs, use a condom.  - Alcohol -  If you drink, do it moderately, less than 2 drinks per day.  - Barrera. Choose someone to speak for you if you are not able.  - Depression is common in our stressful world.If you're feeling down or losing interest in things you normally enjoy, please come in for a visit.  - Violence - If anyone is threatening or hurting you, please call immediately.        Relevant Orders   CBC with Differential/Platelet   Comprehensive metabolic panel   Lipid panel   T4, free   TSH   Avitaminosis D    Chronic, previously stable with use of OTC supplement, 2000 IU/day Repeat labs given co-existing obesity and depression        Relevant Orders   Vitamin D (25 hydroxy)   Clinical depression    Chronic, stable Mild, recurrent Continue Zoloft 100 mg daily Denies SI or HI      Relevant Medications   sertraline (ZOLOFT) 100 MG tablet   Colon cancer screening    Appears pt is due for colon cancer screening; last completed in 2019; however, pt believes that she has completed I will re-order and their system will correct if this is an oversight Denies GI concerns; was on stool softeners with use of opioids for R ankle in 3/23; now off.       Relevant Orders   Cologuard   Elevated serum creatinine    Chronic, stable Review of labs shows last creatinine at  1.18 Repeat CMP      Relevant Orders   Comprehensive metabolic panel   Encounter for hepatitis C screening test for low risk patient    Low risk screen Treatable, and curable. If left untreated Hep C can lead to cirrhosis and liver failure. Encourage routine testing; recommend repeat testing if risk factors change.       Relevant Orders   Hepatitis C Antibody   Encounter for screening mammogram for malignant neoplasm of breast    Due for screening for mammogram, denies breast concerns, provided with phone number to call and schedule appointment for mammogram. Encouraged to repeat breast cancer screening every 1-2 years.       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     Vonna Kotyk, FNP, have reviewed all documentation for this visit. The documentation on 01/07/22 for the exam, diagnosis, procedures, and orders are all accurate and complete.    Gwyneth Sprout, [place] [phone] (phone) [phone] (fax)  [place]

## 2022-01-07 NOTE — Assessment & Plan Note (Signed)

## 2022-01-07 NOTE — Assessment & Plan Note (Signed)
Chronic, stable Review of labs shows last creatinine at 1.18 Repeat CMP

## 2022-01-07 NOTE — Assessment & Plan Note (Signed)
Chronic, stable Mild, recurrent Continue Zoloft 100 mg daily Denies SI or HI

## 2022-01-07 NOTE — Assessment & Plan Note (Signed)
Chronic, previously stable with use of OTC supplement, 2000 IU/day Repeat labs given co-existing obesity and depression

## 2022-01-07 NOTE — Assessment & Plan Note (Signed)
Appears pt is due for colon cancer screening; last completed in 2019; however, pt believes that she has completed I will re-order and their system will correct if this is an oversight Denies GI concerns; was on stool softeners with use of opioids for R ankle in 3/23; now off.

## 2022-01-07 NOTE — Assessment & Plan Note (Addendum)
Chronic, borderline elevated; goal <130/<80 with co-history of diabetes  Denies Chest Pain or Chest Pressure Denies Shortness of Breath/ Dyspnea on Exertion Denies low blood pressure/hypotension Denies vision changes No LE Edema noted on exam; however, non-pitting edema in R ankle s/p surgery in 09/2021, continues to do Physical Therapy  Continue medication, Aspirin 81 mg Daily Reports easily bleeding with injuries; not interested in alternative medications at this time Return to Clinic in 6 months for chronic disease management  Seek emergent care if you develop chest pain or chest pressure

## 2022-01-07 NOTE — Assessment & Plan Note (Signed)
Due for screening for mammogram, denies breast concerns, provided with phone number to call and schedule appointment for mammogram. Encouraged to repeat breast cancer screening every 1-2 years.  

## 2022-01-07 NOTE — Assessment & Plan Note (Signed)
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

## 2022-01-07 NOTE — Patient Instructions (Signed)
Please call and schedule your mammogram:  [facility]  [address], [address] [facility] [place],  [place]  [address] Get Driving Directions Main: [phone]  Sunday:Closed Monday:7:20 AM - 5:00 PM Tuesday:7:20 AM - 5:00 PM Wednesday:7:20 AM - 5:00 PM Thursday:7:20 AM - 5:00 PM Friday:7:20 AM - 4:30 PM Saturday:Closed  

## 2022-01-07 NOTE — Assessment & Plan Note (Signed)
Low risk screen Treatable, and curable. If left untreated Hep C can lead to cirrhosis and liver failure. Encourage routine testing; recommend repeat testing if risk factors change.  

## 2022-01-07 NOTE — Assessment & Plan Note (Signed)
Re-education provided; continues to deny

## 2022-01-07 NOTE — Assessment & Plan Note (Signed)
Chronic, elevated I recommend diet low in saturated fat and regular exercise - 30 min at least 5 times per week Has tried multiple statins with poor tolerance ASCVD risk high; will send to lipid clinic for injection assistance The 10-year ASCVD risk score (Arnett DK, et al., 2019) is: 21.4%   Values used to calculate the score:     Age: 71 years     Sex: Female     Is Non-Hispanic African American: No     Diabetic: Yes     Tobacco smoker: No     Systolic Blood Pressure: 094 mmHg     Is BP treated: No     HDL Cholesterol: 46 mg/dL     Total Cholesterol: 225 mg/dL

## 2022-01-08 ENCOUNTER — Telehealth: Payer: Self-pay | Admitting: Family Medicine

## 2022-01-08 ENCOUNTER — Other Ambulatory Visit: Payer: Self-pay | Admitting: Family Medicine

## 2022-01-08 DIAGNOSIS — R7989 Other specified abnormal findings of blood chemistry: Secondary | ICD-10-CM

## 2022-01-08 DIAGNOSIS — E1169 Type 2 diabetes mellitus with other specified complication: Secondary | ICD-10-CM

## 2022-01-08 DIAGNOSIS — E1122 Type 2 diabetes mellitus with diabetic chronic kidney disease: Secondary | ICD-10-CM

## 2022-01-08 LAB — COMPREHENSIVE METABOLIC PANEL
ALT: 20 IU/L (ref 0–32)
AST: 19 IU/L (ref 0–40)
Albumin/Globulin Ratio: 2 (ref 1.2–2.2)
Albumin: 4.9 g/dL — ABNORMAL HIGH (ref 3.8–4.8)
Alkaline Phosphatase: 109 IU/L (ref 44–121)
BUN/Creatinine Ratio: 17 (ref 12–28)
BUN: 21 mg/dL (ref 8–27)
Bilirubin Total: 0.4 mg/dL (ref 0.0–1.2)
CO2: 19 mmol/L — ABNORMAL LOW (ref 20–29)
Calcium: 9.7 mg/dL (ref 8.7–10.3)
Chloride: 105 mmol/L (ref 96–106)
Creatinine, Ser: 1.23 mg/dL — ABNORMAL HIGH (ref 0.57–1.00)
Globulin, Total: 2.5 g/dL (ref 1.5–4.5)
Glucose: 124 mg/dL — ABNORMAL HIGH (ref 70–99)
Potassium: 4.5 mmol/L (ref 3.5–5.2)
Sodium: 140 mmol/L (ref 134–144)
Total Protein: 7.4 g/dL (ref 6.0–8.5)
eGFR: 47 mL/min/{1.73_m2} — ABNORMAL LOW (ref >59)

## 2022-01-08 LAB — CBC WITH DIFFERENTIAL/PLATELET
Basophils Absolute: 0.1 10*3/uL (ref 0.0–0.2)
Basos: 1 %
EOS (ABSOLUTE): 0.3 10*3/uL (ref 0.0–0.4)
Eos: 5 %
Hematocrit: 45.5 % (ref 34.0–46.6)
Hemoglobin: 15.1 g/dL (ref 11.1–15.9)
Immature Grans (Abs): 0 10*3/uL (ref 0.0–0.1)
Immature Granulocytes: 0 %
Lymphocytes Absolute: 1.9 10*3/uL (ref 0.7–3.1)
Lymphs: 30 %
MCH: 28.4 pg (ref 26.6–33.0)
MCHC: 33.2 g/dL (ref 31.5–35.7)
MCV: 86 fL (ref 79–97)
Monocytes Absolute: 0.4 10*3/uL (ref 0.1–0.9)
Monocytes: 7 %
Neutrophils Absolute: 3.6 10*3/uL (ref 1.4–7.0)
Neutrophils: 57 %
Platelets: 249 10*3/uL (ref 150–450)
RBC: 5.32 x10E6/uL — ABNORMAL HIGH (ref 3.77–5.28)
RDW: 14.6 % (ref 11.7–15.4)
WBC: 6.4 10*3/uL (ref 3.4–10.8)

## 2022-01-08 LAB — MICROALBUMIN / CREATININE URINE RATIO
Creatinine, Urine: 165 mg/dL
Microalb/Creat Ratio: 17 mg/g creat (ref 0–29)
Microalbumin, Urine: 27.7 ug/mL

## 2022-01-08 LAB — LIPID PANEL
Chol/HDL Ratio: 5.6 ratio — ABNORMAL HIGH (ref 0.0–4.4)
Cholesterol, Total: 285 mg/dL — ABNORMAL HIGH (ref 100–199)
HDL: 51 mg/dL (ref >39)
LDL Chol Calc (NIH): 203 mg/dL — ABNORMAL HIGH (ref 0–99)
Triglycerides: 164 mg/dL — ABNORMAL HIGH (ref 0–149)
VLDL Cholesterol Cal: 31 mg/dL (ref 5–40)

## 2022-01-08 LAB — VITAMIN D 25 HYDROXY (VIT D DEFICIENCY, FRACTURES): Vit D, 25-Hydroxy: 57.8 ng/mL (ref 30.0–100.0)

## 2022-01-08 LAB — HEPATITIS C ANTIBODY: Hep C Virus Ab: NONREACTIVE

## 2022-01-08 LAB — HEMOGLOBIN A1C
Est. average glucose Bld gHb Est-mCnc: 126 mg/dL
Hgb A1c MFr Bld: 6 % — ABNORMAL HIGH (ref 4.8–5.6)

## 2022-01-08 LAB — TSH: TSH: 4.35 u[IU]/mL (ref 0.450–4.500)

## 2022-01-08 LAB — T4, FREE: Free T4: 1.07 ng/dL (ref 0.82–1.77)

## 2022-01-08 NOTE — Progress Notes (Signed)
Good morning, Marcie Bal,  Cell count is stable. Very minor elevation in red cell noted; however, no additional follow up needed.  Blood chemistry shows slight increase in creatinine and stable eGFR, filtration rate of waste as defined by the kidneys. Elevation in blood sugar also remains.  Cholesterol is up 60 points. Bad/LDL cholesterol is also increased. I recommend diet low in saturated fat and regular exercise - 30 min at least 5 times per week  I would recommend referral to lipid specialist for further assistance in best heart attack/stroke prevention. As current labs, indicate risk of cardiovascular event of 23% in next 10 years.   The 10-year ASCVD risk score (Arnett DK, et al., 2019) is: 22.4%   Values used to calculate the score:     Age: 71 years     Sex: Female     Is Non-Hispanic African American: No     Diabetic: Yes     Tobacco smoker: No     Systolic Blood Pressure: 867 mmHg     Is BP treated: No     HDL Cholesterol: 51 mg/dL     Total Cholesterol: 285 mg/dL  Normal and stable thyroid  Normal and stable Vit D  Non-reactive Hep C.  Urine labs have not returned at this time  Please call and schedule mammogram and follow up with results for Colon cancer kit as you mentioned getting your results. You can attach a picture to MyChart or stop by the office and we can copy the report.  Please let us know if you have any questions.  Thank you, Gwyneth Sprout, [address] [place], [place] [address] [phone] (phone) [phone] (fax) [place]

## 2022-01-08 NOTE — Telephone Encounter (Signed)
Pt called about lab results. She is interested in the lipid referral. She also wants to let you know she did the colon screening in May and it was negative. (I was able to see this in chart.)

## 2022-01-11 IMAGING — CR DG ABDOMEN 1V
2 series · 2 of 2 positions shown · non-contrast
Comparison: CT abdomen and pelvis 10/29/2014

CLINICAL DATA: Routine follow-up of kidney stones. No reported
current complaints.

EXAM:
ABDOMEN - 1 VIEW

[abdomen kub (1 of 2)]
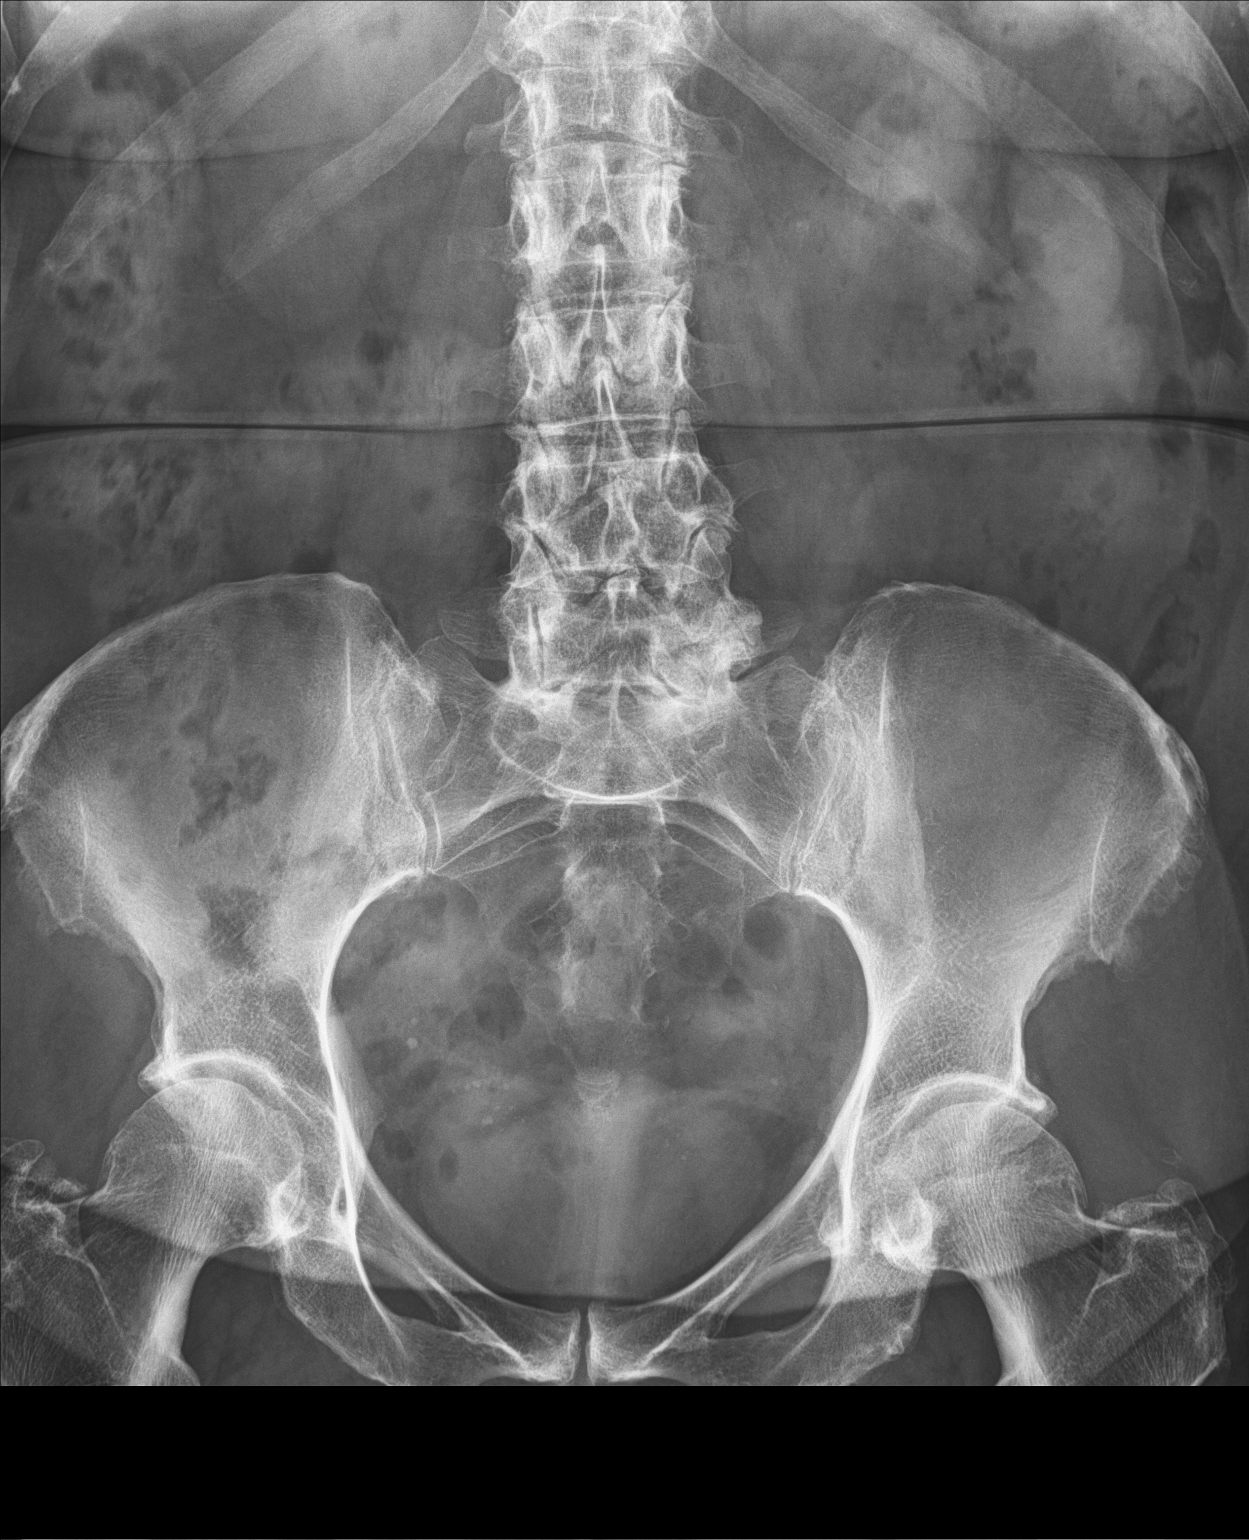

[abdomen kub (2 of 2)]
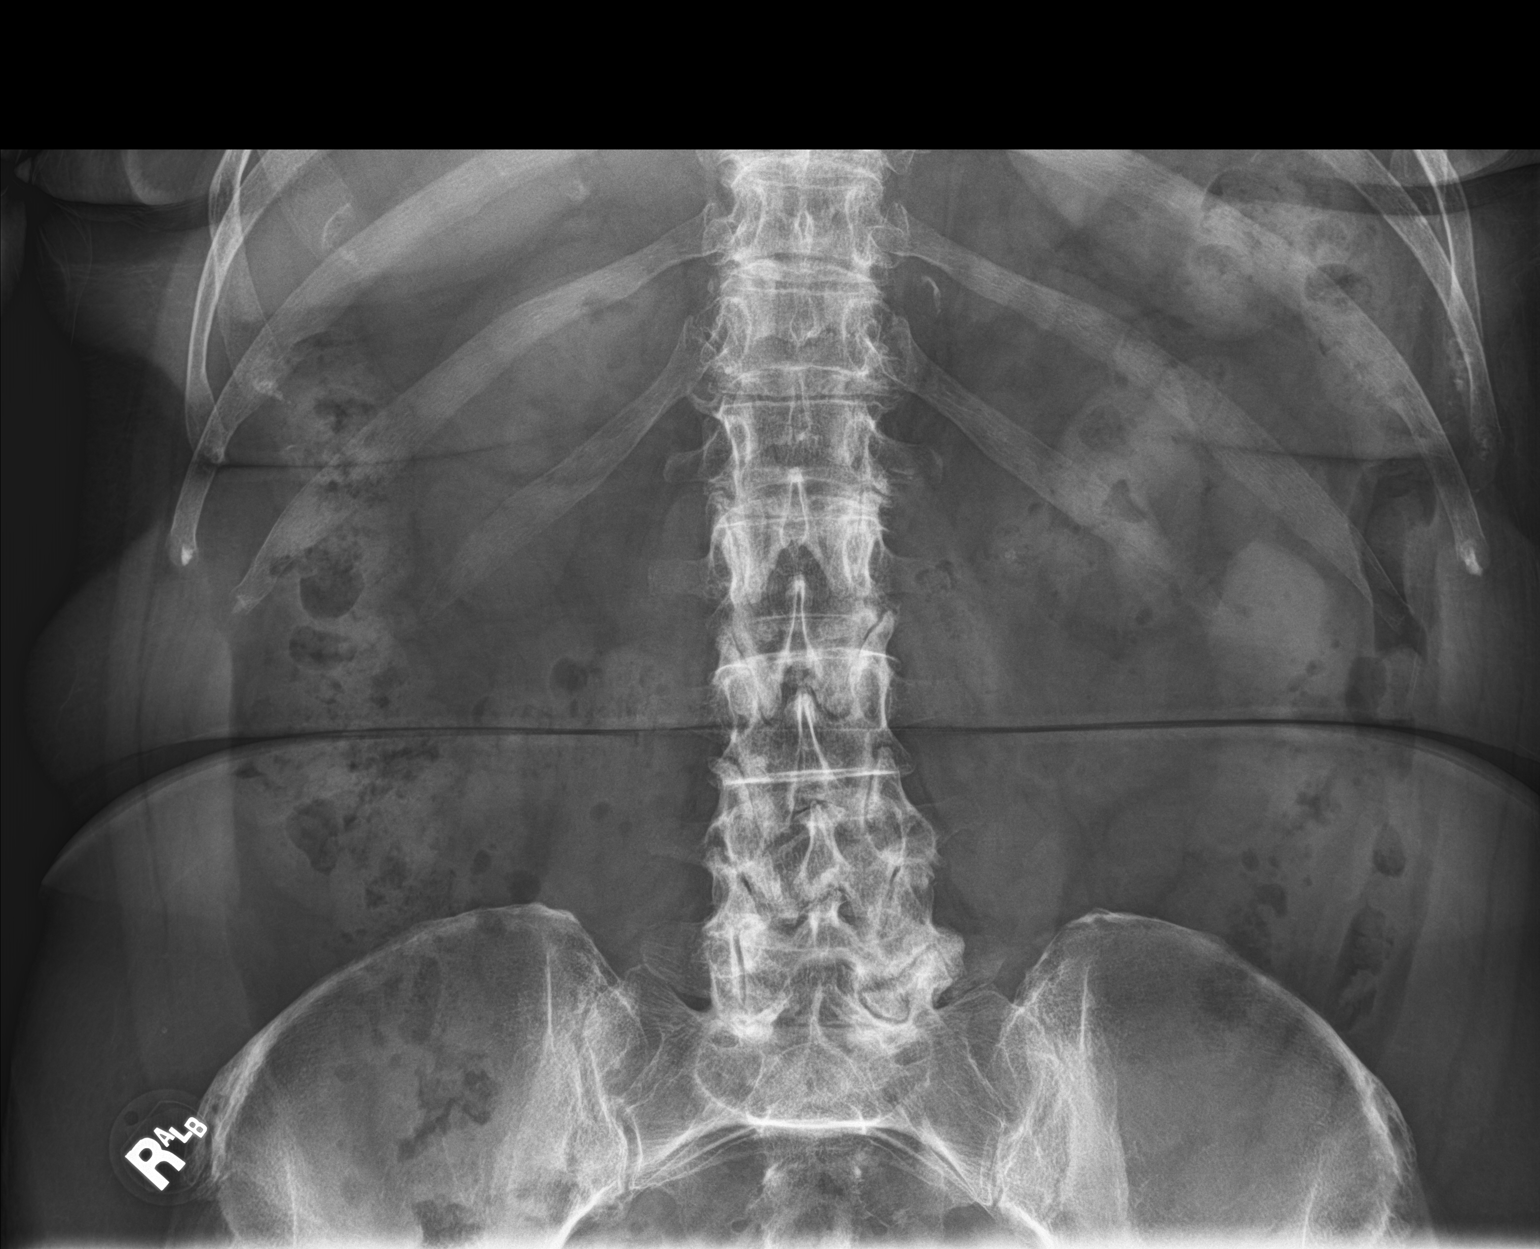

[2 of 2 positions shown; findings below may reference images not displayed]

FINDINGS: No convincing radiopaque renal calculi are identified taking into
account the transverse colon which overlies the left kidney. Small
calcifications in the right greater than left pelvis likely reflect
phleboliths in the absence of active renal colic symptoms. No
dilated loops of bowel are seen to suggest obstruction. No acute
osseous abnormality is identified.
IMPRESSION: No definite urinary tract calculus identified.

## 2022-01-17 DIAGNOSIS — Z1211 Encounter for screening for malignant neoplasm of colon: Secondary | ICD-10-CM | POA: Diagnosis not present

## 2022-01-22 DIAGNOSIS — D2261 Melanocytic nevi of right upper limb, including shoulder: Secondary | ICD-10-CM | POA: Diagnosis not present

## 2022-01-22 DIAGNOSIS — Z85828 Personal history of other malignant neoplasm of skin: Secondary | ICD-10-CM | POA: Diagnosis not present

## 2022-01-22 DIAGNOSIS — D225 Melanocytic nevi of trunk: Secondary | ICD-10-CM | POA: Diagnosis not present

## 2022-01-22 DIAGNOSIS — D2272 Melanocytic nevi of left lower limb, including hip: Secondary | ICD-10-CM | POA: Diagnosis not present

## 2022-01-22 DIAGNOSIS — L570 Actinic keratosis: Secondary | ICD-10-CM | POA: Diagnosis not present

## 2022-01-29 LAB — COLOGUARD: COLOGUARD: NEGATIVE

## 2022-01-30 NOTE — Progress Notes (Signed)
Negative cologuard.  Repeat in 3 years.  [name] [name], [address] [place], [place] [address] [phone] (phone) [phone] (fax) [place]

## 2022-02-12 ENCOUNTER — Other Ambulatory Visit: Payer: Self-pay | Admitting: Nephrology

## 2022-02-12 DIAGNOSIS — E119 Type 2 diabetes mellitus without complications: Secondary | ICD-10-CM | POA: Diagnosis not present

## 2022-02-12 DIAGNOSIS — N1831 Chronic kidney disease, stage 3a: Secondary | ICD-10-CM | POA: Diagnosis not present

## 2022-02-12 DIAGNOSIS — I10 Essential (primary) hypertension: Secondary | ICD-10-CM | POA: Diagnosis not present

## 2022-02-12 DIAGNOSIS — N200 Calculus of kidney: Secondary | ICD-10-CM | POA: Diagnosis not present

## 2022-02-12 DIAGNOSIS — E785 Hyperlipidemia, unspecified: Secondary | ICD-10-CM | POA: Diagnosis not present

## 2022-02-13 ENCOUNTER — Ambulatory Visit
Admission: RE | Admit: 2022-02-13 | Discharge: 2022-02-13 | Disposition: A | Discharge status: Home / Self Care | Payer: Medicare PPO | Source: Ambulatory Visit | Attending: Family Medicine | Admitting: Family Medicine

## 2022-02-13 DIAGNOSIS — Z1231 Encounter for screening mammogram for malignant neoplasm of breast: Secondary | ICD-10-CM | POA: Insufficient documentation

## 2022-02-14 NOTE — Progress Notes (Signed)
Hi Deshea  Normal mammogram; repeat in 1 year.  Please let us know if you have any questions.  Thank you,  Tally Joe, FNP

## 2022-02-20 ENCOUNTER — Ambulatory Visit
Admission: RE | Admit: 2022-02-20 | Discharge: 2022-02-20 | Disposition: A | Discharge status: Home / Self Care | Payer: Medicare PPO | Source: Ambulatory Visit | Attending: Nephrology | Admitting: Nephrology

## 2022-02-20 DIAGNOSIS — N189 Chronic kidney disease, unspecified: Secondary | ICD-10-CM | POA: Diagnosis not present

## 2022-02-20 DIAGNOSIS — N1831 Chronic kidney disease, stage 3a: Secondary | ICD-10-CM | POA: Diagnosis not present

## 2022-02-20 DIAGNOSIS — E785 Hyperlipidemia, unspecified: Secondary | ICD-10-CM | POA: Diagnosis not present

## 2022-04-24 DIAGNOSIS — E119 Type 2 diabetes mellitus without complications: Secondary | ICD-10-CM | POA: Diagnosis not present

## 2022-04-24 DIAGNOSIS — N1831 Chronic kidney disease, stage 3a: Secondary | ICD-10-CM | POA: Diagnosis not present

## 2022-04-24 DIAGNOSIS — N200 Calculus of kidney: Secondary | ICD-10-CM | POA: Diagnosis not present

## 2022-04-24 DIAGNOSIS — E785 Hyperlipidemia, unspecified: Secondary | ICD-10-CM | POA: Diagnosis not present

## 2022-04-24 DIAGNOSIS — I10 Essential (primary) hypertension: Secondary | ICD-10-CM | POA: Diagnosis not present

## 2022-06-04 ENCOUNTER — Ambulatory Visit (HOSPITAL_BASED_OUTPATIENT_CLINIC_OR_DEPARTMENT_OTHER): Payer: Medicare PPO | Admitting: Internal Medicine

## 2022-06-04 ENCOUNTER — Encounter (HOSPITAL_BASED_OUTPATIENT_CLINIC_OR_DEPARTMENT_OTHER): Payer: Self-pay | Admitting: Internal Medicine

## 2022-06-04 VITALS — BP 148/84 | HR 82 | Ht 62.0 in | Wt 171.6 lb

## 2022-06-04 DIAGNOSIS — M791 Myalgia, unspecified site: Secondary | ICD-10-CM

## 2022-06-04 DIAGNOSIS — N1831 Chronic kidney disease, stage 3a: Secondary | ICD-10-CM | POA: Diagnosis not present

## 2022-06-04 DIAGNOSIS — T466X5A Adverse effect of antihyperlipidemic and antiarteriosclerotic drugs, initial encounter: Secondary | ICD-10-CM

## 2022-06-04 DIAGNOSIS — E7800 Pure hypercholesterolemia, unspecified: Secondary | ICD-10-CM

## 2022-06-04 DIAGNOSIS — E119 Type 2 diabetes mellitus without complications: Secondary | ICD-10-CM | POA: Diagnosis not present

## 2022-06-04 NOTE — Patient Instructions (Signed)
Medication Instructions:   Dr. Debara Pickett has suggested we check your benefits for [facility].   [name] is a subcutaneous injection that will lower your LDL cholesterol by 50%. There is a loading dose of 2 injections, given 3 months apart. After this, the injections are every 6 months.  Injection appointments are at [facility]   *If you need a refill on your cardiac medications before your next appointment, please call your pharmacy*   Lab Work: FASTING lab work to check cholesterol in 4-5 months -- complete about 1 week before next visit   If you have labs (blood work) drawn today and your tests are completely normal, you will receive your results only by: [place] (if you have MyChart) OR A paper copy in the mail If you have any lab test that is abnormal or we need to change your treatment, we will call you to review the results.  Follow-Up: At [facility], you and your health needs are our priority.  As part of our continuing mission to provide you with exceptional heart care, we have created designated Provider Care Teams.  These Care Teams include your primary Cardiologist (physician) and Advanced Practice Providers (APPs -  Physician Assistants and Nurse Practitioners) who all work together to provide you with the care you need, when you need it.  We recommend signing up for the patient portal called "MyChart".  Sign up information is provided on this After Visit Summary.  MyChart is used to connect with patients for Virtual Visits (Telemedicine).  Patients are able to view lab/test results, encounter notes, upcoming appointments, etc.  Non-urgent messages can be sent to your provider as well.   To learn more about what you can do with MyChart, go to [email or web address].    Your next appointment:    4-5 months with Dr. Debara Pickett -- LIPID CLINIC

## 2022-06-04 NOTE — Progress Notes (Signed)
LIPID CLINIC CONSULT NOTE  Chief Complaint:  Manage dyslipidemia  Primary Care Physician: [name] Sprout, FNP  Primary Cardiologist:  None  HPI:  [name] [name] is a 71 y.o. female who is being seen today for the evaluation of dyslipidemia at the request of [name] Sprout, FNP.  This is a pleasant 72 year old female with a history of high cholesterol for most of her life.  She says that even when she was more active and running her cholesterol was still high.  Or recently her total cholesterol was 288, triglycerides 164, HDL 51 and LDL 203.  She denies any cardiovascular disease.  She notes no family history of heart disease although her father apparently was listed as dying from an MI but he apparently had a fall and it cut off his airway.  She had previously tried statins, including atorvastatin, lovastatin and others which cause muscle aches and spasms.  She also tried ezetimibe in the past which caused this issue.  She reports a number of other allergies or intolerances.  She has hesitancy about trying medications to lower her cholesterol.  Did not seem that she was concerned much about her risk of heart disease.  We did have a long discussion about how high her cholesterol is and likely the fact that it is a genetic cholesterol disorder, however it may not be a very high risk one.  Guideline recommendations are for least 50% lipid-lowering.  She reports recently she has tried to adjust her diet further including reducing saturated fats and after recovering from an ankle injury has been exercising more.  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   PONV (postoperative nausea and vomiting)    Portal vein thrombosis 2015   Pre-diabetes    Shingles    VAIN I (vaginal intraepithelial neoplasia grade I)     Past Surgical History:  Procedure Laterality Date   ABDOMINAL HYSTERECTOMY  1986   BLADDER SURGERY     at the same time of hysterectomy   EXCISION HAGLUND'S DEFORMITY WITH ACHILLES TENDON REPAIR Right 10/11/2021   Procedure: Right gastroc recession, excision of Haglund deformity, Achilles tendon debridement and reconstruction;  Surgeon: Wylene Simmer, MD;  Location: [facility];  Service: Orthopedics;  Laterality: Right;   FOOT SURGERY Left 08/20/2012   Dr. Elvina Mattes   TUBAL LIGATION     WISDOM TOOTH EXTRACTION  1986    FAMHx:  Family History  Problem Relation Age of Onset   Parkinsonism Mother    Breast cancer Mother 7   Alcohol abuse Father    Lymphoma Son     SOCHx:   reports that she has never smoked. She has never used smokeless tobacco. She reports current alcohol use. She reports that she does not use drugs.  ALLERGIES:  Allergies  Allergen Reactions   Atorvastatin Other (See Comments)    Muscle spasms   Lovastatin     muscle  aches   Statins Other (See Comments)   Wheat Bran Swelling   Zetia  [Ezetimibe]     muscle aches   Metronidazole Nausea And Vomiting and Rash    ROS: Pertinent items noted in HPI and remainder of comprehensive ROS otherwise negative.  HOME MEDS: Current Outpatient Medications on File Prior to Visit  Medication Sig Dispense Refill   aspirin EC 81 MG tablet Take 81 mg by mouth. Takes a few times weekly. Swallow whole.     Cholecalciferol (VITAMIN D3) 2000 units TABS Take 1 capsule by mouth daily.     meloxicam (MOBIC) 15 MG tablet Take 1 tablet (15 mg total) by mouth daily. Take with meal; avoid other NSAIDs with use. 90 tablet 1   sertraline (ZOLOFT) 100 MG tablet Take 1 tablet (100 mg total) by mouth daily. 90  tablet 1   No current facility-administered medications on file prior to visit.    LABS/IMAGING: No results found for this or any previous visit (from the past 48 hour(s)). No results found.  LIPID PANEL:    Component Value Date/Time   CHOL 285 (H) 01/07/2022 0843   TRIG 164 (H) 01/07/2022 0843   HDL 51 01/07/2022 0843   CHOLHDL 5.6 (H) 01/07/2022 0843   LDLCALC 203 (H) 01/07/2022 0843    WEIGHTS: Wt Readings from Last 3 Encounters:  06/04/22 171 lb 9.6 oz (77.8 kg)  01/07/22 164 lb 6.4 oz (74.6 kg)  10/11/21 159 lb 2.8 oz (72.2 kg)    VITALS: BP (!) 148/84   Pulse 82   Ht '5\' 2"'$  (1.575 m)   Wt 171 lb 9.6 oz (77.8 kg)   BMI 31.39 kg/m   EXAM: Deferred  EKG: Deferred  ASSESSMENT: Possible familial hyperlipidemia, LDL greater than 190 (Simon Broome criteria) Statin and ezetimibe intolerance-myalgias Type 2 diabetes-not on medication-recent hemoglobin A1c 6.0% CKD 3  PLAN: 1.   Ms. 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  It is generally been significantly elevated for most of her life.  She has been intolerant to statins and ezetimibe.  It is recommended that she have at least 50% reduction in her lipids.  Given few additional options I would recommend a PCSK9 inhibitor.  We will pursue prior authorization for this.  Plan repeat lipids in 3 to 4 months and follow-up with me afterwards.  We will check a lipid NMR and LP(a) as well.  Thanks for the kind referral.  Pixie Casino, MD, FACC, [place] Director of the Advanced Lipid Disorders &  Cardiovascular Risk Reduction Clinic Diplomate of the American Board of Clinical Lipidology Attending Cardiologist  Direct Dial: [phone]  Fax: [phone]  Website:  [email or web address] [email or web address] Dustin Burrill 06/04/2022, 9:09 AM

## 2022-06-05 ENCOUNTER — Telehealth: Payer: Self-pay | Admitting: Internal Medicine

## 2022-06-05 DIAGNOSIS — E7800 Pure hypercholesterolemia, unspecified: Secondary | ICD-10-CM

## 2022-06-05 NOTE — Telephone Encounter (Signed)
Benefits investigation form for [facility] faxed to [phone]

## 2022-06-07 DIAGNOSIS — M5451 Vertebrogenic low back pain: Secondary | ICD-10-CM | POA: Diagnosis not present

## 2022-06-07 DIAGNOSIS — M25562 Pain in left knee: Secondary | ICD-10-CM | POA: Diagnosis not present

## 2022-06-25 ENCOUNTER — Encounter: Payer: Self-pay | Admitting: Internal Medicine

## 2022-06-25 DIAGNOSIS — E7800 Pure hypercholesterolemia, unspecified: Secondary | ICD-10-CM | POA: Insufficient documentation

## 2022-06-25 NOTE — Telephone Encounter (Signed)
Jenna, Noted. Thanks.

## 2022-06-25 NOTE — Telephone Encounter (Signed)
Patient has been identified as candidate for Leqvio  Benefits investigation enrollment completed on 06/13/22 (received to writer on 06/25/22)  Benefits investigation report notes the following:  Type of insurance: [employer]  OOP Max: $4000 / Met: $743.46  Deductible: none  Co-insurance: none  PA required: Y  PA phone number: not provided  Benefits Summary Details:  [employer] Medicare Advantage PPO $40 copay which goes towards $4000 OOP max Once OOP max is met, Leqvio is covered at 100%

## 2022-06-25 NOTE — Telephone Encounter (Signed)
Spoke with patient about Leqvio. Explained that patient assistance will be necessary, given her insurance coverage. She would like to pursue this. She was notified that a PA is required, that our colleagues at [facility] will be in contact with her about next steps once PA and patient assistance is worked out.

## 2022-06-25 NOTE — Addendum Note (Signed)
Addended by: Fidel Levy on: 06/25/2022 09:09 AM   Modules accepted: Orders

## 2022-06-26 ENCOUNTER — Telehealth: Payer: Self-pay | Admitting: Pharmacy Technician

## 2022-06-26 NOTE — Telephone Encounter (Addendum)
Auth Submission: APPROVED Payer: health team advt Medication & CPT/J Code(s) submitted: Leqvio (Inclisiran) [phone] Route of submission (phone, fax, portal):  Phone # [phone]- opt [phone] Fax # [phone] Auth type: Buy/Bill Units/visits requested: x2 doses Reference number: [id number] Approval from: 07/03/22 to  01/02/23  LEQVIO BIV:  [employer] will cover 80% and patient will be responsible for remaining 20%. Patient qualifies for PAP once medication is approved.

## 2022-07-10 NOTE — Telephone Encounter (Signed)
[name] [name], It's still pending.  I have requested a Courtesy Review.  It has been denied, but I have submitted additional clinical notes for review.  I will f/u once patient has been approved.

## 2022-07-11 NOTE — Telephone Encounter (Addendum)
[name] f/u:  Patient has been approved for leqvio. Patient will be scheduled to received tx @ [facility] APPROVAL DATE: 07/23/22 - 01/02/23.   Per [facility]:   MRN: [id number] [name] [name]) [name] - after benefits investigation, program confirmed patients [employer] insurance is covering tx in full and did not qualify.

## 2022-08-04 ENCOUNTER — Other Ambulatory Visit: Payer: Self-pay | Admitting: Family Medicine

## 2022-08-04 DIAGNOSIS — M159 Polyosteoarthritis, unspecified: Secondary | ICD-10-CM

## 2022-08-05 DIAGNOSIS — D485 Neoplasm of uncertain behavior of skin: Secondary | ICD-10-CM | POA: Diagnosis not present

## 2022-08-05 DIAGNOSIS — D2262 Melanocytic nevi of left upper limb, including shoulder: Secondary | ICD-10-CM | POA: Diagnosis not present

## 2022-08-05 DIAGNOSIS — D2271 Melanocytic nevi of right lower limb, including hip: Secondary | ICD-10-CM | POA: Diagnosis not present

## 2022-08-05 DIAGNOSIS — Z85828 Personal history of other malignant neoplasm of skin: Secondary | ICD-10-CM | POA: Diagnosis not present

## 2022-08-05 DIAGNOSIS — L739 Follicular disorder, unspecified: Secondary | ICD-10-CM | POA: Diagnosis not present

## 2022-08-05 DIAGNOSIS — L570 Actinic keratosis: Secondary | ICD-10-CM | POA: Diagnosis not present

## 2022-08-05 DIAGNOSIS — D2261 Melanocytic nevi of right upper limb, including shoulder: Secondary | ICD-10-CM | POA: Diagnosis not present

## 2022-08-05 NOTE — Telephone Encounter (Signed)
Requested Prescriptions  Pending Prescriptions Disp Refills   meloxicam (MOBIC) 15 MG tablet [Pharmacy Med Name: MELOXICAM 15 MG TABLET] 90 tablet 0    Sig: TAKE 1 TABLET (15 MG TOTAL) BY MOUTH DAILY. TAKE WITH MEAL AVOID OTHER NSAIDS WITH USE.     Analgesics:  COX2 Inhibitors Failed - 08/04/2022  8:48 AM      Failed - Manual Review: Labs are only required if the patient has taken medication for more than 8 weeks.      Failed - Cr in normal range and within 360 days    Creatinine  Date Value Ref Range Status  10/30/2014 1.16 (H) mg/dL Final    Comment:    0.44-1.00 NOTE: New Reference Range  10/04/14    Creatinine, Ser  Date Value Ref Range Status  01/07/2022 1.23 (H) 0.57 - 1.00 mg/dL Final         Passed - HGB in normal range and within 360 days    Hemoglobin  Date Value Ref Range Status  01/07/2022 15.1 11.1 - 15.9 g/dL Final         Passed - HCT in normal range and within 360 days    Hematocrit  Date Value Ref Range Status  01/07/2022 45.5 34.0 - 46.6 % Final         Passed - AST in normal range and within 360 days    AST  Date Value Ref Range Status  01/07/2022 19 0 - 40 IU/L Final   SGOT(AST)  Date Value Ref Range Status  10/29/2014 58 (H) U/L Final    Comment:    15-41 NOTE: New Reference Range  10/04/14          Passed - ALT in normal range and within 360 days    ALT  Date Value Ref Range Status  01/07/2022 20 0 - 32 IU/L Final   SGPT (ALT)  Date Value Ref Range Status  10/29/2014 75 (H) U/L Final    Comment:    14-54 NOTE: New Reference Range  10/04/14          Passed - eGFR is 30 or above and within 360 days    EGFR (African American)  Date Value Ref Range Status  10/30/2014 58 (L)  Final   GFR calc Af Amer  Date Value Ref Range Status  03/24/2020 60 >59 mL/min/1.73 Final    Comment:    **[facility] currently reports eGFR in compliance with the current**   recommendations of the [employer]. [facility] will   update  reporting as new guidelines are published from the NKF-ASN   Task force.    EGFR (Non-African Amer.)  Date Value Ref Range Status  10/30/2014 50 (L)  Final    Comment:    eGFR values <60m/min/1.73 m2 may be an indication of chronic kidney disease (CKD). Calculated eGFR is useful in patients with stable renal function. The eGFR calculation will not be reliable in acutely ill patients when serum creatinine is changing rapidly. It is not useful in patients on dialysis. The eGFR calculation may not be applicable to patients at the low and high extremes of body sizes, pregnant women, and vegetarians.    GFR, Estimated  Date Value Ref Range Status  10/05/2021 50 (L) >60 mL/min Final    Comment:    (NOTE) Calculated using the CKD-EPI Creatinine Equation (2021)    eGFR  Date Value Ref Range Status  01/07/2022 47 (L) >59 mL/min/1.73 Final  Passed - Patient is not pregnant      Passed - Valid encounter within last 12 months    Recent Outpatient Visits           7 months ago Medicare annual wellness visit, subsequent   [facility] Tally Joe T, FNP   1 year ago Hypertension associated with diabetes [facility])   [facility], Dionne Bucy, MD   2 years ago Annual physical exam   [facility] Mar Daring, [place]   3 years ago Annual physical exam   [place], Clearnce Sorrel, [place]   4 years ago Annual physical exam   [place], Clearnce Sorrel, [place]       Future Appointments             In 2 months Hilty, Nadean Corwin, MD [facility], [facility]

## 2022-08-20 ENCOUNTER — Encounter: Payer: Self-pay | Admitting: Internal Medicine

## 2022-09-05 ENCOUNTER — Ambulatory Visit (INDEPENDENT_AMBULATORY_CARE_PROVIDER_SITE_OTHER): Payer: Medicare PPO

## 2022-09-05 VITALS — BP 164/87 | HR 75 | Temp 97.8°F | Resp 16 | Ht 62.0 in | Wt 172.0 lb

## 2022-09-05 DIAGNOSIS — E7800 Pure hypercholesterolemia, unspecified: Secondary | ICD-10-CM | POA: Diagnosis not present

## 2022-09-05 MED ORDER — INCLISIRAN SODIUM 284 MG/1.5ML ~~LOC~~ SOSY
284.0000 mg | PREFILLED_SYRINGE | Freq: Once | SUBCUTANEOUS | Status: AC
  Administered 2022-09-05: 284 mg via SUBCUTANEOUS
  Filled 2022-09-05: qty 1.5

## 2022-09-05 NOTE — Progress Notes (Signed)
Diagnosis: Hyperlipidemia  Provider:  Marshell Garfinkel MD  Procedure: Injection  Leqvio (inclisiran), Dose: 284 mg, Site: subcutaneous, Number of injections: 1  Post Care: Observation period completed  Discharge: Condition: Good, Destination: Home . AVS Provided and AVS Declined  Performed by:  Paul Dykes, RN

## 2022-09-10 DIAGNOSIS — Z0100 Encounter for examination of eyes and vision without abnormal findings: Secondary | ICD-10-CM | POA: Diagnosis not present

## 2022-09-10 DIAGNOSIS — H2513 Age-related nuclear cataract, bilateral: Secondary | ICD-10-CM | POA: Diagnosis not present

## 2022-09-10 LAB — HM DIABETES EYE EXAM

## 2022-09-13 ENCOUNTER — Other Ambulatory Visit: Payer: Self-pay | Admitting: Family Medicine

## 2022-09-13 DIAGNOSIS — F330 Major depressive disorder, recurrent, mild: Secondary | ICD-10-CM

## 2022-09-24 ENCOUNTER — Telehealth: Payer: Self-pay | Admitting: Internal Medicine

## 2022-09-24 NOTE — Telephone Encounter (Signed)
Spoke with pt, aware she can put hydrocortisone cream at the injection site. Aware muscle aches can happen with this medication but isolated pain may not be related. She reports the pain in her knee is bad. Aware nothing to do at this time regarding the injection but will make dr hilty aware.

## 2022-09-24 NOTE — Telephone Encounter (Signed)
Spoke with pt, aware of dr hilty's recommendations. She reports the rash developed 2 weeks after the injection. Explained not sure they are related but no changes right now. She has a follow up in a couple weeks and will discuss at that time.

## 2022-09-24 NOTE — Telephone Encounter (Signed)
Pt c/o medication issue:  1. Name of Medication:   Leqvio   2. How are you currently taking this medication (dosage and times per day)? As prescribed  3. Are you having a reaction (difficulty breathing--STAT)?   No  4. What is your medication issue?   Patient stated she received this shot on 2/8 and on 2/25 she developed a rash at the injection site.  Patient stated she also has joint soreness in her left knee on the right side.

## 2022-10-08 DIAGNOSIS — E7800 Pure hypercholesterolemia, unspecified: Secondary | ICD-10-CM | POA: Diagnosis not present

## 2022-10-13 LAB — NMR, LIPOPROFILE
Cholesterol, Total: 191 mg/dL (ref 100–199)
HDL Particle Number: 30.9 umol/L (ref >=30.5)
HDL-C: 48 mg/dL (ref >39)
LDL Particle Number: 1818 nmol/L — ABNORMAL HIGH (ref <1000)
LDL Size: 20.4 nm — ABNORMAL LOW (ref >20.5)
LDL-C (NIH Calc): 111 mg/dL — ABNORMAL HIGH (ref 0–99)
LP-IR Score: 81 — ABNORMAL HIGH (ref <=45)
Small LDL Particle Number: 1100 nmol/L — ABNORMAL HIGH (ref <=527)
Triglycerides: 186 mg/dL — ABNORMAL HIGH (ref 0–149)

## 2022-10-13 LAB — LIPOPROTEIN A (LPA): Lipoprotein (a): 13.8 nmol/L (ref <75.0)

## 2022-10-14 ENCOUNTER — Other Ambulatory Visit: Payer: Self-pay | Admitting: *Deleted

## 2022-10-16 DIAGNOSIS — M1712 Unilateral primary osteoarthritis, left knee: Secondary | ICD-10-CM | POA: Diagnosis not present

## 2022-10-17 ENCOUNTER — Encounter (HOSPITAL_BASED_OUTPATIENT_CLINIC_OR_DEPARTMENT_OTHER): Payer: Self-pay | Admitting: Internal Medicine

## 2022-10-17 ENCOUNTER — Ambulatory Visit (HOSPITAL_BASED_OUTPATIENT_CLINIC_OR_DEPARTMENT_OTHER): Payer: Medicare PPO | Admitting: Internal Medicine

## 2022-10-17 VITALS — BP 136/90 | HR 79 | Ht 62.0 in | Wt 178.0 lb

## 2022-10-17 DIAGNOSIS — M791 Myalgia, unspecified site: Secondary | ICD-10-CM

## 2022-10-17 DIAGNOSIS — T466X5A Adverse effect of antihyperlipidemic and antiarteriosclerotic drugs, initial encounter: Secondary | ICD-10-CM

## 2022-10-17 DIAGNOSIS — E119 Type 2 diabetes mellitus without complications: Secondary | ICD-10-CM | POA: Diagnosis not present

## 2022-10-17 DIAGNOSIS — E7800 Pure hypercholesterolemia, unspecified: Secondary | ICD-10-CM | POA: Diagnosis not present

## 2022-10-17 DIAGNOSIS — T466X5D Adverse effect of antihyperlipidemic and antiarteriosclerotic drugs, subsequent encounter: Secondary | ICD-10-CM

## 2022-10-17 NOTE — Patient Instructions (Signed)
Medication Instructions:  Continue with next Leqvio injection.   *If you need a refill on your cardiac medications before your next appointment, please call your pharmacy*   Lab Work: FASTING lab work to check cholesterol in 6 months -- complete about 1 week before next visit  If you have labs (blood work) drawn today and your tests are completely normal, you will receive your results only by: [place] (if you have MyChart) OR A paper copy in the mail If you have any lab test that is abnormal or we need to change your treatment, we will call you to review the results.    Follow-Up: At [facility], you and your health needs are our priority.  As part of our continuing mission to provide you with exceptional heart care, we have created designated Provider Care Teams.  These Care Teams include your primary Cardiologist (physician) and Advanced Practice Providers (APPs -  Physician Assistants and Nurse Practitioners) who all work together to provide you with the care you need, when you need it.  We recommend signing up for the patient portal called "MyChart".  Sign up information is provided on this After Visit Summary.  MyChart is used to connect with patients for Virtual Visits (Telemedicine).  Patients are able to view lab/test results, encounter notes, upcoming appointments, etc.  Non-urgent messages can be sent to your provider as well.   To learn more about what you can do with MyChart, go to [email or web address].    Your next appointment:    6 months with Dr. Debara Pickett

## 2022-10-17 NOTE — Progress Notes (Signed)
LIPID CLINIC CONSULT NOTE  Chief Complaint:  Follow-up dyslipidemia  Primary Care Physician: [name] Sprout, FNP  Primary Cardiologist:  None  HPI:  [name] [name] is a 72 y.o. female who is being seen today for the evaluation of dyslipidemia at the request of [name] Sprout, FNP.  This is a pleasant 72 year old female with a history of high cholesterol for most of her life.  She says that even when she was more active and running her cholesterol was still high.  Or recently her total cholesterol was 288, triglycerides 164, HDL 51 and LDL 203.  She denies any cardiovascular disease.  She notes no family history of heart disease although her father apparently was listed as dying from an MI but he apparently had a fall and it cut off his airway.  She had previously tried statins, including atorvastatin, lovastatin and others which cause muscle aches and spasms.  She also tried ezetimibe in the past which caused this issue.  She reports a number of other allergies or intolerances.  She has hesitancy about trying medications to lower her cholesterol.  Did not seem that she was concerned much about her risk of heart disease.  We did have a long discussion about how high her cholesterol is and likely the fact that it is a genetic cholesterol disorder, however it may not be a very high risk one.  Guideline recommendations are for least 50% lipid-lowering.  She reports recently she has tried to adjust her diet further including reducing saturated fats and after recovering from an ankle injury has been exercising more.  10/17/2022  [name] returns today for follow-up.  Unfortunately she may have had a delayed hypersensitivity reaction to her Leqvio injection.  She was injected in the left triceps area.  Initially this went well but several weeks after that she developed an itchy rash which was hot.  She applied topical hydrocortisone to it.  She still has some lingering effects at this time.  That  being said, her cholesterol is quite a bit lower than with 1 injection.  LDL-C now is down to 111 from 203, LDL particle #1818 and triglycerides were 186.  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   PONV (postoperative nausea and vomiting)    Portal vein thrombosis 2015   Pre-diabetes    Shingles    VAIN I (vaginal intraepithelial neoplasia grade I)     Past Surgical History:  Procedure Laterality Date   ABDOMINAL HYSTERECTOMY  1986   BLADDER SURGERY     at the same time of hysterectomy   EXCISION HAGLUND'S DEFORMITY WITH ACHILLES TENDON REPAIR Right 10/11/2021   Procedure: Right gastroc recession, excision of Haglund deformity, Achilles tendon debridement and reconstruction;  Surgeon: [name] Simmer, MD;  Location: [facility];  Service: Orthopedics;  Laterality: Right;   FOOT SURGERY Left 08/20/2012   Dr. Elvina [name]   TUBAL LIGATION  WISDOM TOOTH EXTRACTION  1986    FAMHx:  Family History  Problem Relation Age of Onset   Parkinsonism Mother    Breast cancer Mother 43   Alcohol abuse Father    Lymphoma Son     SOCHx:   reports that she has never smoked. She has never used smokeless tobacco. She reports current alcohol use. She reports that she does not use drugs.  ALLERGIES:  Allergies  Allergen Reactions   Atorvastatin Other (See Comments)    Muscle spasms   Lovastatin     muscle aches   Statins Other (See Comments)   Wheat Swelling   Zetia  [Ezetimibe]     muscle aches   Metronidazole Nausea And Vomiting and Rash    ROS: Pertinent items noted in HPI and  remainder of comprehensive ROS otherwise negative.  HOME MEDS: Current Outpatient Medications on File Prior to Visit  Medication Sig Dispense Refill   aspirin EC 81 MG tablet Take 81 mg by mouth. Takes a few times weekly. Swallow whole.     Cholecalciferol (VITAMIN D3) 2000 units TABS Take 1 capsule by mouth daily.     inclisiran (LEQVIO) 284 MG/1.5ML SOSY injection Inject 284 mg into the skin as directed.     meloxicam (MOBIC) 15 MG tablet Take 15 mg by mouth as needed for pain.     sertraline (ZOLOFT) 100 MG tablet TAKE 1 TABLET BY MOUTH EVERY DAY 90 tablet 1   No current facility-administered medications on file prior to visit.    LABS/IMAGING: No results found for this or any previous visit (from the past 48 hour(s)). No results found.  LIPID PANEL:    Component Value Date/Time   CHOL 285 (H) 01/07/2022 0843   TRIG 164 (H) 01/07/2022 0843   HDL 51 01/07/2022 0843   CHOLHDL 5.6 (H) 01/07/2022 0843   LDLCALC 203 (H) 01/07/2022 0843    WEIGHTS: Wt Readings from Last 3 Encounters:  10/17/22 178 lb (80.7 kg)  09/05/22 172 lb (78 kg)  06/04/22 171 lb 9.6 oz (77.8 kg)    VITALS: BP (!) 136/90   Pulse 79   Ht 5\' 2"  (1.575 m)   Wt 178 lb (80.7 kg)   BMI 32.56 kg/m   EXAM: Deferred  EKG: Deferred  ASSESSMENT: Possible familial hyperlipidemia, LDL greater than 190 (Simon Broome criteria) Statin and ezetimibe intolerance-myalgias Type 2 diabetes-not on medication-recent hemoglobin A1c 6.0% CKD 3  PLAN: 1.   [name] has had a positive response to a single dose of Leqvio but may have had a delayed hypersensitivity reaction.  There really is not a way to prevent this with an antihistamine or premedication.  She may be subject to having it again with a future injection or she may not.  She has another injection scheduled on the ninth.  As she has responded well to therapy I would personally retry the next injection, but this is completely her decision.  I would try to  inject in the other arm or abdomen.  Follow-up in 6 months.  [name] Casino, MD, [facility], [place] Director of the [facility] Lipid Disorders &  Cardiovascular Risk Reduction Clinic Diplomate of the American Board of Clinical Lipidology Attending Cardiologist  Direct Dial: [phone]  Fax: [phone]  Website:  [email or web address] 10/17/2022, 10:19 AM

## 2022-10-21 DIAGNOSIS — Z79899 Other long term (current) drug therapy: Secondary | ICD-10-CM | POA: Diagnosis not present

## 2022-10-21 DIAGNOSIS — I10 Essential (primary) hypertension: Secondary | ICD-10-CM | POA: Diagnosis not present

## 2022-10-21 DIAGNOSIS — E119 Type 2 diabetes mellitus without complications: Secondary | ICD-10-CM | POA: Diagnosis not present

## 2022-10-21 DIAGNOSIS — N1831 Chronic kidney disease, stage 3a: Secondary | ICD-10-CM | POA: Diagnosis not present

## 2022-11-01 ENCOUNTER — Other Ambulatory Visit: Payer: Self-pay | Admitting: Family Medicine

## 2022-11-01 DIAGNOSIS — M159 Polyosteoarthritis, unspecified: Secondary | ICD-10-CM

## 2022-11-01 NOTE — Telephone Encounter (Signed)
Requested medications are due for refill today.  Unsure  Requested medications are on the active medications list.  yes  Last refill. 10/17/2022 unknown quantity  Future visit scheduled.   no  Notes to clinic.  Medication listed as historical.    Requested Prescriptions  Pending Prescriptions Disp Refills   meloxicam (MOBIC) 15 MG tablet [Pharmacy Med Name: MELOXICAM 15 MG TABLET] 90 tablet     Sig: TAKE 1 TABLET (15 MG TOTAL) BY MOUTH DAILY. TAKE WITH MEAL AVOID OTHER NSAIDS WITH USE.     Analgesics:  COX2 Inhibitors Failed - 11/01/2022  2:25 AM      Failed - Manual Review: Labs are only required if the patient has taken medication for more than 8 weeks.      Failed - Cr in normal range and within 360 days    Creatinine  Date Value Ref Range Status  10/30/2014 1.16 (H) mg/dL Final    Comment:    [id number] NOTE: New Reference Range  10/04/14    Creatinine, Ser  Date Value Ref Range Status  01/07/2022 1.23 (H) 0.57 - 1.00 mg/dL Final         Passed - HGB in normal range and within 360 days    Hemoglobin  Date Value Ref Range Status  01/07/2022 15.1 11.1 - 15.9 g/dL Final         Passed - HCT in normal range and within 360 days    Hematocrit  Date Value Ref Range Status  01/07/2022 45.5 34.0 - 46.6 % Final         Passed - AST in normal range and within 360 days    AST  Date Value Ref Range Status  01/07/2022 19 0 - 40 IU/L Final   SGOT(AST)  Date Value Ref Range Status  10/29/2014 58 (H) U/L Final    Comment:    15-41 NOTE: New Reference Range  10/04/14          Passed - ALT in normal range and within 360 days    ALT  Date Value Ref Range Status  01/07/2022 20 0 - 32 IU/L Final   SGPT (ALT)  Date Value Ref Range Status  10/29/2014 75 (H) U/L Final    Comment:    14-54 NOTE: New Reference Range  10/04/14          Passed - eGFR is 30 or above and within 360 days    EGFR (African American)  Date Value Ref Range Status  10/30/2014 58 (L)  Final    GFR calc Af Amer  Date Value Ref Range Status  03/24/2020 60 >59 mL/min/1.73 Final    Comment:    **[facility] currently reports eGFR in compliance with the current**   recommendations of the [employer]. [facility] will   update reporting as new guidelines are published from the NKF-ASN   Task force.    EGFR (Non-African Amer.)  Date Value Ref Range Status  10/30/2014 50 (L)  Final    Comment:    eGFR values <6760mL/min/1.73 m2 may be an indication of chronic kidney disease (CKD). Calculated eGFR is useful in patients with stable renal function. The eGFR calculation will not be reliable in acutely ill patients when serum creatinine is changing rapidly. It is not useful in patients on dialysis. The eGFR calculation may not be applicable to patients at the low and high extremes of body sizes, pregnant women, and vegetarians.    GFR, Estimated  Date Value Ref  Range Status  10/05/2021 50 (L) >60 mL/min Final    Comment:    (NOTE) Calculated using the CKD-EPI Creatinine Equation (2021)    eGFR  Date Value Ref Range Status  01/07/2022 47 (L) >59 mL/min/1.73 Final         Passed - Patient is not pregnant      Passed - Valid encounter within last 12 months    Recent Outpatient Visits           9 months ago Medicare annual wellness visit, subsequent   [facility] Merita Norton T, FNP   1 year ago Hypertension associated with diabetes [facility])   [facility], Marzella Schlein, MD   2 years ago Annual physical exam   [facility] [place], Alessandra Bevel[place], [place]   3 years ago Annual physical exam   [facility] [place], [place], [place]   4 years ago Annual physical exam   [facility] [place], [place], [place]       Future Appointments             In 5 months Hilty, Lisette Abu, MD [facility] at [facility],  [place]

## 2022-12-05 ENCOUNTER — Ambulatory Visit (INDEPENDENT_AMBULATORY_CARE_PROVIDER_SITE_OTHER): Payer: Medicare PPO | Admitting: *Deleted

## 2022-12-05 VITALS — BP 160/89 | HR 70 | Temp 97.6°F | Resp 16 | Ht 62.0 in | Wt 174.2 lb

## 2022-12-05 DIAGNOSIS — E7800 Pure hypercholesterolemia, unspecified: Secondary | ICD-10-CM | POA: Diagnosis not present

## 2022-12-05 MED ORDER — INCLISIRAN SODIUM 284 MG/1.5ML ~~LOC~~ SOSY
284.0000 mg | PREFILLED_SYRINGE | Freq: Once | SUBCUTANEOUS | Status: AC
  Administered 2022-12-05: 284 mg via SUBCUTANEOUS
  Filled 2022-12-05: qty 1.5

## 2022-12-05 NOTE — Progress Notes (Signed)
Diagnosis: Hyperlipidemia  Provider:  Chilton Greathouse MD  Procedure: Injection  Leqvio (inclisiran), Dose: 284 mg, Site: subcutaneous, Number of injections: 1  Post Care: Observation period completed  Discharge: Condition: Good, Destination: Home . AVS Provided  Performed by:  Forrest Moron, RN

## 2022-12-24 ENCOUNTER — Telehealth: Payer: Self-pay | Admitting: Internal Medicine

## 2022-12-24 NOTE — Telephone Encounter (Signed)
Patient states injection was 5/9.  Reaction started the 23rd. She did do the other arm for this injection.  She states it was given in the upper part of arm in the muscle (unlike last time) and not as bad as last time.  But still dry and itching. Using benadryl cream but not helping.  She can come to office if needed for nurse visit, please advise

## 2022-12-24 NOTE — Telephone Encounter (Signed)
Patient said that she is experiencing another reaction from [facility] from when she came in on 12/05/22. Was told if another reaction occurs that Dr. Rennis Golden wants to see it. Please call back

## 2022-12-24 NOTE — Telephone Encounter (Signed)
Patient of Dr. Rennis Golden, routing to correct pool

## 2022-12-24 NOTE — Telephone Encounter (Signed)
Patient stated that you told her if it happened the second time that you wanted to see her. The first time it lasted two months. Would you like for her to have an appointment

## 2022-12-24 NOTE — Telephone Encounter (Signed)
Patient is aware of Dr. Rennis Golden message she will take picture for documentation. She will lets [place] know if she decides to stop taking the injections

## 2022-12-26 DIAGNOSIS — M1712 Unilateral primary osteoarthritis, left knee: Secondary | ICD-10-CM | POA: Diagnosis not present

## 2023-01-06 ENCOUNTER — Encounter: Payer: Medicare PPO | Admitting: Family Medicine

## 2023-01-06 ENCOUNTER — Ambulatory Visit: Payer: Medicare PPO

## 2023-01-13 ENCOUNTER — Ambulatory Visit (INDEPENDENT_AMBULATORY_CARE_PROVIDER_SITE_OTHER): Payer: Medicare PPO

## 2023-01-13 VITALS — Ht 62.0 in | Wt 170.0 lb

## 2023-01-13 DIAGNOSIS — Z1231 Encounter for screening mammogram for malignant neoplasm of breast: Secondary | ICD-10-CM

## 2023-01-13 DIAGNOSIS — Z0000 Encounter for general adult medical examination without abnormal findings: Secondary | ICD-10-CM

## 2023-01-13 DIAGNOSIS — Z13820 Encounter for screening for osteoporosis: Secondary | ICD-10-CM

## 2023-01-13 NOTE — Addendum Note (Signed)
Addended by: Sue Lush on: 01/13/2023 01:28 PM   Modules accepted: Orders

## 2023-01-13 NOTE — Progress Notes (Addendum)
I connected with  [name] on 01/13/23 by a audio enabled telemedicine application and verified that I am speaking with the correct person using two identifiers.  Patient Location: Home  Provider Location: Office/Clinic  I discussed the limitations of evaluation and management by telemedicine. The patient expressed understanding and agreed to proceed.  Subjective:   [name] [name] is a 72 y.o. female who presents for Medicare Annual (Subsequent) preventive examination.  Review of Systems    Cardiac Risk Factors include: advanced age (>46men, >13 women);dyslipidemia;hypertension;diabetes mellitus;obesity (BMI >30kg/m2)    Objective:    Today's Vitals   01/13/23 1256 01/13/23 1257  Weight: 170 lb (77.1 kg)   Height: 5\' 2"  (1.575 m)   PainSc:  4    Body mass index is 31.09 kg/m.     01/13/2023    1:08 PM 10/11/2021    7:21 AM 10/02/2021    1:42 PM 01/27/2020    9:45 AM 01/26/2019    8:37 AM 01/23/2018    9:04 AM 07/02/2017    1:08 PM  Advanced Directives  Does Patient Have a Medical Advance Directive? Yes Yes Yes No No No No  Type of Special educational needs teacher of [place];Living will Healthcare Power of [place];Living will      Does patient want to make changes to medical advance directive?  No - Patient declined No - Patient declined   Yes (MAU/Ambulatory/Procedural Areas - Information given)   Copy of Healthcare Power of Attorney in Chart?  No - copy requested       Would patient like information on creating a medical advance directive?    No - Patient declined No - Patient declined  Yes (MAU/Ambulatory/Procedural Areas - Information given)    Current Medications (verified) Outpatient Encounter Medications as of 01/13/2023  Medication Sig   aspirin EC 81 MG tablet Take 81 mg by mouth. Takes a few times weekly. Swallow whole.   Cholecalciferol (VITAMIN D3) 2000 units TABS Take 1 capsule by mouth daily.   sertraline (ZOLOFT) 100 MG tablet TAKE 1 TABLET BY MOUTH  EVERY DAY   inclisiran (LEQVIO) 284 MG/1.5ML SOSY injection Inject 284 mg into the skin as directed. (Patient not taking: Reported on 01/13/2023)   meloxicam (MOBIC) 15 MG tablet Take 15 mg by mouth as needed for pain.   No facility-administered encounter medications on file as of 01/13/2023.    Allergies (verified) Atorvastatin, Lovastatin, Statins, Wheat, Zetia  [ezetimibe], and Metronidazole   History: Past Medical History:  Diagnosis Date   Allergy    Anxiety    Arthritis    Basal cell carcinoma    in vaginal area   Basal cell carcinoma (BCC) in situ of skin    Cataract 2022   Just beginning   Chronic kidney disease    Complication of anesthesia    Depression    Diabetes mellitus without complication (HCC) 2013?   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   PONV (postoperative nausea and vomiting)    Portal vein thrombosis 2015   Pre-diabetes    Shingles    VAIN I (vaginal intraepithelial neoplasia grade I)    Past Surgical History:  Procedure Laterality Date   ABDOMINAL HYSTERECTOMY  1986   BLADDER SURGERY     at the same  time of hysterectomy   EXCISION HAGLUND'S DEFORMITY WITH ACHILLES TENDON REPAIR Right 10/11/2021   Procedure: Right gastroc recession, excision of Haglund deformity, Achilles tendon debridement and reconstruction;  Surgeon: Toni Arthurs, MD;  Location: [facility];  Service: Orthopedics;  Laterality: Right;   FOOT SURGERY Left 08/20/2012   Dr. Orland Jarred   HERNIA REPAIR     TUBAL LIGATION     WISDOM TOOTH EXTRACTION  1986   Family History  Problem Relation Age of Onset   Parkinsonism Mother    Breast cancer Mother 4   Arthritis Mother    Cancer Mother    Hearing loss Mother    Miscarriages / [place] Mother    Alcohol abuse Father    Diabetes Father    Lymphoma Son     Diabetes Son    Drug abuse Son    Obesity Son    Arthritis Brother    Hearing loss Brother    Cancer Son    Social History   Socioeconomic History   Marital status: Married    Spouse name: Viviann Spare   Number of children: 3   Years of education: 4 years college   Highest education level: Bachelor's degree (e.g., BA, AB, BS)  Occupational History   Occupation: retired Runner, broadcasting/film/video   Occupation: book Nurse, 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  Tobacco Use   Smoking status: Never   Smokeless tobacco: Never  Vaping Use   Vaping Use: Never used  Substance and Sexual Activity   Alcohol use: Yes    Comment: rare   Drug use: Never   Sexual activity: Not Currently    Birth control/protection: None    Comment: hysterectomy  Other Topics Concern   Not on file  Social History Narrative   Not on file   Social Determinants of Health   Financial Resource Strain: Low Risk  (01/09/2023)   Overall Financial Resource Strain (CARDIA)    Difficulty of Paying Living Expenses: Not very hard  Food Insecurity: No Food Insecurity (01/09/2023)   Hunger Vital Sign    Worried About Running Out of Food in the Last Year: Never true    Ran Out of Food in the Last Year: Never true  Transportation Needs: No Transportation Needs (01/09/2023)   PRAPARE - Administrator, Civil Service (Medical): No    Lack of Transportation (Non-Medical): No  Physical Activity: Insufficiently Active (01/09/2023)   Exercise Vital Sign    Days of Exercise per Week: 3 days    Minutes of Exercise per Session: 30 min  Stress: No Stress Concern Present (01/09/2023)   [employer] of Occupational Health - Occupational Stress Questionnaire    Feeling of Stress : Only a little  Social Connections: Unknown (01/09/2023)   Social Connection and Isolation Panel [NHANES]    Frequency of Communication with Friends and Family: Three times a week    Frequency of Social Gatherings with Friends and Family: More than three times a week     Attends Religious Services: Not on Marketing executive or Organizations: Yes    Attends Banker Meetings: 1 to 4 times per year    Marital Status: Married    Tobacco Counseling Counseling given: Not Answered  Clinical Intake:  Pre-visit preparation completed: Yes  Pain : 0-10 Pain Score: 4  Pain Type: Chronic pain Pain Location: Knee Pain Orientation: Left Pain Descriptors / Indicators: Aching Pain Onset: More than a month ago Pain  Frequency: Intermittent Pain Relieving Factors: cortisone injections helped some  Pain Relieving Factors: cortisone injections helped some  BMI - recorded: 31.09 Nutritional Status: BMI > 30  Obese Nutritional Risks: None Diabetes: Yes CBG done?: No Did pt. bring in CBG monitor from home?: No  How often do you need to have someone help you when you read instructions, pamphlets, or other written materials from your doctor or pharmacy?: 1 - Never  Diabetic?yes  Interpreter Needed?: No  Comments: lives with husband Information entered by :: B.Corita Allinson,LPN   Activities of Daily Living    01/09/2023    9:16 PM  In your present state of health, do you have any difficulty performing the following activities:  Hearing? 0  Vision? 0  Difficulty concentrating or making decisions? 0  Walking or climbing stairs? 0  Dressing or bathing? 0  Doing errands, shopping? 0  Preparing Food and eating ? N  Using the Toilet? N  In the past six months, have you accidently leaked urine? Y  Do you have problems with loss of bowel control? N  Managing your Medications? N  Managing your Finances? N  Housekeeping or managing your Housekeeping? N    Patient Care Team: Jacky Kindle, FNP as PCP - General (Family Medicine) Ivette Loyal, PA-C as Consulting Physician Eli Phillips, OD as Consulting Physician (Optometry) Lorn Junes Campbell Riches, MD as Referring Physician (Dermatology) Sondra Come, MD as Consulting  Physician (Urology)  Indicate any recent Medical Services you may have received from other than [facility] providers in the past year (date may be approximate).     Assessment:   This is a routine wellness examination for Aleli.  Hearing/Vision screen Hearing Screening - Comments:: Adequate hearing:little loss in lft ear Vision Screening - Comments:: Adequate vision w/glasses with readers Dr Faylene Kurtz  Dietary issues and exercise activities discussed:     Goals Addressed             This Visit's Progress    DIET - INCREASE WATER INTAKE   On track    Recommend to drink at least 6-8 8oz glasses of water per day.       Depression Screen    01/13/2023    1:07 PM 01/07/2022    8:28 AM 07/06/2021    8:55 AM 03/24/2020    9:07 AM 01/27/2020    9:40 AM 03/24/2019   10:16 AM 01/26/2019    8:37 AM  PHQ 2/9 Scores  PHQ - 2 Score 0 0 0 0 0 0 0  PHQ- 9 Score  1 0 1  1     Fall Risk    01/09/2023    9:16 PM 01/07/2022    8:28 AM 07/06/2021    8:55 AM 03/24/2020    9:06 AM 01/27/2020    9:45 AM  Fall Risk   Falls in the past year? 0 0 0 0 0  Number falls in past yr: 0 0 0 0 0  Injury with Fall? 0 0 0 0 0  Risk for fall due to : No Fall Risks  No Fall Risks No Fall Risks   Follow up Education provided;Falls prevention discussed  Falls evaluation completed Falls evaluation completed     FALL RISK PREVENTION PERTAINING TO THE HOME:  Any stairs in or around the home? Yes  If so, are there any without handrails? Yes  Home free of loose throw rugs in walkways, pet beds, electrical cords, etc? Yes  Adequate lighting in your home  to reduce risk of falls? Yes   ASSISTIVE DEVICES UTILIZED TO PREVENT FALLS:  Life alert? No  Use of a cane, walker or w/c? No  Grab bars in the bathroom? Yes  Shower chair or bench in shower? Yes does not use Elevated toilet seat or a handicapped toilet? Yes   Cognitive Function:        01/13/2023    1:14 PM 01/27/2020    9:50 AM 01/26/2019    8:44 AM  01/23/2018    9:07 AM  6CIT Screen  What Year? 0 points 0 points 0 points 0 points  What month? 0 points 0 points 0 points 0 points  What time? 0 points 0 points 0 points 0 points  Count back from 20 0 points 0 points 0 points 0 points  Months in reverse 0 points 0 points 0 points 0 points  Repeat phrase 0 points 0 points 0 points 0 points  Total Score 0 points 0 points 0 points 0 points    Immunizations Immunization History  Administered Date(s) Administered   PFIZER(Purple Top)SARS-COV-2 Vaccination 10/20/2019, 11/10/2019   Pfizer Covid-19 Vaccine Bivalent Booster 3yrs & up 05/22/2020   Td 12/14/2000   Tdap 10/07/2012   Zoster Recombinat (Shingrix) 01/22/2021, 05/11/2021    TDAP status: Up to date  Flu Vaccine status: Declined, Education has been provided regarding the importance of this vaccine but patient still declined. Advised may receive this vaccine at local pharmacy or Health Dept. Aware to provide a copy of the vaccination record if obtained from local pharmacy or Health Dept. Verbalized acceptance and understanding.  Pneumococcal vaccine status: Up to date  Covid-19 vaccine status: Completed vaccines  Qualifies for Shingles Vaccine? Yes   Zostavax completed Yes   Shingrix Completed?: Yes  Screening Tests Health Maintenance  Topic Date Due   Pneumonia Vaccine 16+ Years old (1 of 1 - PCV) Never done   COVID-19 Vaccine (4 - 2023-24 season) 03/29/2022   DEXA SCAN  05/06/2022   FOOT EXAM  07/06/2022   HEMOGLOBIN A1C  07/09/2022   DTaP/Tdap/Td (3 - Td or Tdap) 10/08/2022   COLON CANCER SCREENING ANNUAL FOBT  11/29/2022   Diabetic kidney evaluation - eGFR measurement  01/08/2023   Diabetic kidney evaluation - Urine ACR  01/08/2023   INFLUENZA VACCINE  02/27/2023   OPHTHALMOLOGY EXAM  09/11/2023   Medicare Annual Wellness (AWV)  01/13/2024   MAMMOGRAM  02/14/2024   Hepatitis C Screening  Completed   Zoster Vaccines- Shingrix  Completed   HPV VACCINES  Aged Out    Fecal DNA (Cologuard)  Discontinued    Health Maintenance  Health Maintenance Due  Topic Date Due   Pneumonia Vaccine 76+ Years old (1 of 1 - PCV) Never done   COVID-19 Vaccine (4 - 2023-24 season) 03/29/2022   DEXA SCAN  05/06/2022   FOOT EXAM  07/06/2022   HEMOGLOBIN A1C  07/09/2022   DTaP/Tdap/Td (3 - Td or Tdap) 10/08/2022   COLON CANCER SCREENING ANNUAL FOBT  11/29/2022   Diabetic kidney evaluation - eGFR measurement  01/08/2023   Diabetic kidney evaluation - Urine ACR  01/08/2023    Colorectal cancer screening: Type of screening: Colonoscopy. Completed no. Repeat every 3 years  Mammogram status: Ordered yes. Pt provided with contact info and advised to call to schedule appt.   Bone Density status: Ordered yes. Pt provided with contact info and advised to call to schedule appt.  Lung Cancer Screening: (Low Dose CT Chest recommended if Age  55-80 years, 30 pack-year currently smoking OR have quit w/in 15years.) does not qualify.   Lung Cancer Screening Referral: no  Additional Screening:  Hepatitis C Screening: does not qualify; Completed yes  Vision Screening: Recommended annual ophthalmology exams for early detection of glaucoma and other disorders of the eye. Is the patient up to date with their annual eye exam?  Yes  Who is the provider or what is the name of the office in which the patient attends annual eye exams? Dr Faylene Kurtz If pt is not established with a provider, would they like to be referred to a provider to establish care? No .   Dental Screening: Recommended annual dental exams for proper oral hygiene  Community Resource Referral / Chronic Care Management: CRR required this visit?  No   CCM required this visit?  No     Plan:     I have personally reviewed and noted the following in the patient's chart:   Medical and social history Use of alcohol, tobacco or illicit drugs  Current medications and supplements including opioid prescriptions.  Patient is not currently taking opioid prescriptions. Functional ability and status Nutritional status Physical activity Advanced directives List of other physicians Hospitalizations, surgeries, and ER visits in previous 12 months Vitals Screenings to include cognitive, depression, and falls Referrals and appointments  In addition, I have reviewed and discussed with patient certain preventive protocols, quality metrics, and best practice recommendations. A written personalized care plan for preventive services as well as general preventive health recommendations were provided to patient.    Sue Lush, LPN   1/61/0960   Nurse Notes: pt states she is doing well. She does mention that she has been on Zoloft for 20 years and wonders is it losing his effect or any effects from being on medication long-term.

## 2023-01-13 NOTE — Patient Instructions (Signed)
[name] [name] , Thank you for taking time to come for your Medicare Wellness Visit. I appreciate your ongoing commitment to your health goals. Please review the following plan we discussed and let me know if I can assist you in the future.   These are the goals we discussed:  Goals      DIET - INCREASE WATER INTAKE     Recommend to drink at least 6-8 8oz glasses of water per day.        This is a list of the screening recommended for you and due dates:  Health Maintenance  Topic Date Due   Pneumonia Vaccine (1 of 1 - PCV) Never done   COVID-19 Vaccine (4 - 2023-24 season) 03/29/2022   DEXA scan (bone density measurement)  05/06/2022   Complete foot exam   07/06/2022   Hemoglobin A1C  07/09/2022   DTaP/Tdap/Td vaccine (3 - Td or Tdap) 10/08/2022   Stool Blood Test  11/29/2022   Yearly kidney function blood test for diabetes  01/08/2023   Yearly kidney health urinalysis for diabetes  01/08/2023   Flu Shot  02/27/2023   Eye exam for diabetics  09/11/2023   Medicare Annual Wellness Visit  01/13/2024   Mammogram  02/14/2024   Hepatitis C Screening  Completed   Zoster (Shingles) Vaccine  Completed   HPV Vaccine  Aged Out   Cologuard (Stool DNA test)  Discontinued    Advanced directives: yes  Conditions/risks identified: low falls risk  Next appointment: Follow up in one year for your annual wellness visit 01/14/2024 @ 1pm telephone   Preventive Care 65 Years and Older, Female Preventive care refers to lifestyle choices and visits with your health care provider that can promote health and wellness. What does preventive care include? A yearly physical exam. This is also called an annual well check. Dental exams once or twice a year. Routine eye exams. Ask your health care provider how often you should have your eyes checked. Personal lifestyle choices, including: Daily care of your teeth and gums. Regular physical activity. Eating a healthy diet. Avoiding tobacco and drug  use. Limiting alcohol use. Practicing safe sex. Taking low-dose aspirin every day. Taking vitamin and mineral supplements as recommended by your health care provider. What happens during an annual well check? The services and screenings done by your health care provider during your annual well check will depend on your age, overall health, lifestyle risk factors, and family history of disease. Counseling  Your health care provider may ask you questions about your: Alcohol use. Tobacco use. Drug use. Emotional well-being. Home and relationship well-being. Sexual activity. Eating habits. History of falls. Memory and ability to understand (cognition). Work and work Astronomer. Reproductive health. Screening  You may have the following tests or measurements: Height, weight, and BMI. Blood pressure. Lipid and cholesterol levels. These may be checked every 5 years, or more frequently if you are over 63 years old. Skin check. Lung cancer screening. You may have this screening every year starting at age 68 if you have a 30-pack-year history of smoking and currently smoke or have quit within the past 15 years. Fecal occult blood test (FOBT) of the stool. You may have this test every year starting at age 70. Flexible sigmoidoscopy or colonoscopy. You may have a sigmoidoscopy every 5 years or a colonoscopy every 10 years starting at age 51. Hepatitis C blood test. Hepatitis B blood test. Sexually transmitted disease (STD) testing. Diabetes screening. This is done by checking  your blood sugar (glucose) after you have not eaten for a while (fasting). You may have this done every 1-3 years. Bone density scan. This is done to screen for osteoporosis. You may have this done starting at age 40. Mammogram. This may be done every 1-2 years. Talk to your health care provider about how often you should have regular mammograms. Talk with your health care provider about your test results, treatment  options, and if necessary, the need for more tests. Vaccines  Your health care provider may recommend certain vaccines, such as: Influenza vaccine. This is recommended every year. Tetanus, diphtheria, and acellular pertussis (Tdap, Td) vaccine. You may need a Td booster every 10 years. Zoster vaccine. You may need this after age 1. Pneumococcal 13-valent conjugate (PCV13) vaccine. One dose is recommended after age 92. Pneumococcal polysaccharide (PPSV23) vaccine. One dose is recommended after age 1. Talk to your health care provider about which screenings and vaccines you need and how often you need them. This information is not intended to replace advice given to you by your health care provider. Make sure you discuss any questions you have with your health care provider. Document Released: 08/11/2015 Document Revised: 04/03/2016 Document Reviewed: 05/16/2015 Elsevier Interactive Patient Education  2017 [employer] Prevention in the Home Falls can cause injuries. They can happen to people of all ages. There are many things you can do to make your home safe and to help prevent falls. What can I do on the outside of my home? Regularly fix the edges of walkways and driveways and fix any cracks. Remove anything that might make you trip as you walk through a door, such as a raised step or threshold. Trim any bushes or trees on the path to your home. Use bright outdoor lighting. Clear any walking paths of anything that might make someone trip, such as rocks or tools. Regularly check to see if handrails are loose or broken. Make sure that both sides of any steps have handrails. Any raised decks and porches should have guardrails on the edges. Have any leaves, snow, or ice cleared regularly. Use sand or salt on walking paths during winter. Clean up any spills in your garage right away. This includes oil or grease spills. What can I do in the bathroom? Use night lights. Install grab  bars by the toilet and in the tub and shower. Do not use towel bars as grab bars. Use non-skid mats or decals in the tub or shower. If you need to sit down in the shower, use a plastic, non-slip stool. Keep the floor dry. Clean up any water that spills on the floor as soon as it happens. Remove soap buildup in the tub or shower regularly. Attach bath mats securely with double-sided non-slip rug tape. Do not have throw rugs and other things on the floor that can make you trip. What can I do in the bedroom? Use night lights. Make sure that you have a light by your bed that is easy to reach. Do not use any sheets or blankets that are too big for your bed. They should not hang down onto the floor. Have a firm chair that has side arms. You can use this for support while you get dressed. Do not have throw rugs and other things on the floor that can make you trip. What can I do in the kitchen? Clean up any spills right away. Avoid walking on wet floors. Keep items that you use a lot  in easy-to-reach places. If you need to reach something above you, use a strong step stool that has a grab bar. Keep electrical cords out of the way. Do not use floor polish or wax that makes floors slippery. If you must use wax, use non-skid floor wax. Do not have throw rugs and other things on the floor that can make you trip. What can I do with my stairs? Do not leave any items on the stairs. Make sure that there are handrails on both sides of the stairs and use them. Fix handrails that are broken or loose. Make sure that handrails are as long as the stairways. Check any carpeting to make sure that it is firmly attached to the stairs. Fix any carpet that is loose or worn. Avoid having throw rugs at the top or bottom of the stairs. If you do have throw rugs, attach them to the floor with carpet tape. Make sure that you have a light switch at the top of the stairs and the bottom of the stairs. If you do not have them,  ask someone to add them for you. What else can I do to help prevent falls? Wear shoes that: Do not have high heels. Have rubber bottoms. Are comfortable and fit you well. Are closed at the toe. Do not wear sandals. If you use a stepladder: Make sure that it is fully opened. Do not climb a closed stepladder. Make sure that both sides of the stepladder are locked into place. Ask someone to hold it for you, if possible. Clearly mark and make sure that you can see: Any grab bars or handrails. First and last steps. Where the edge of each step is. Use tools that help you move around (mobility aids) if they are needed. These include: Canes. Walkers. Scooters. Crutches. Turn on the lights when you go into a dark area. Replace any light bulbs as soon as they burn out. Set up your furniture so you have a clear path. Avoid moving your furniture around. If any of your floors are uneven, fix them. If there are any pets around you, be aware of where they are. Review your medicines with your doctor. Some medicines can make you feel dizzy. This can increase your chance of falling. Ask your doctor what other things that you can do to help prevent falls. This information is not intended to replace advice given to you by your health care provider. Make sure you discuss any questions you have with your health care provider. Document Released: 05/11/2009 Document Revised: 12/21/2015 Document Reviewed: 08/19/2014 Elsevier Interactive Patient Education  2017 [employer].

## 2023-02-10 IMAGING — CR DG ABDOMEN 1V
1 series · 2 of 2 positions shown · non-contrast
Comparison: June 06, 2020

CLINICAL DATA: Hematuria.  History of kidney stones.

EXAM:
ABDOMEN - 1 VIEW

[Series 1: dg abd 1 view · 0.14mm/px · 2 of 2 slices shown]
[im 1/2]
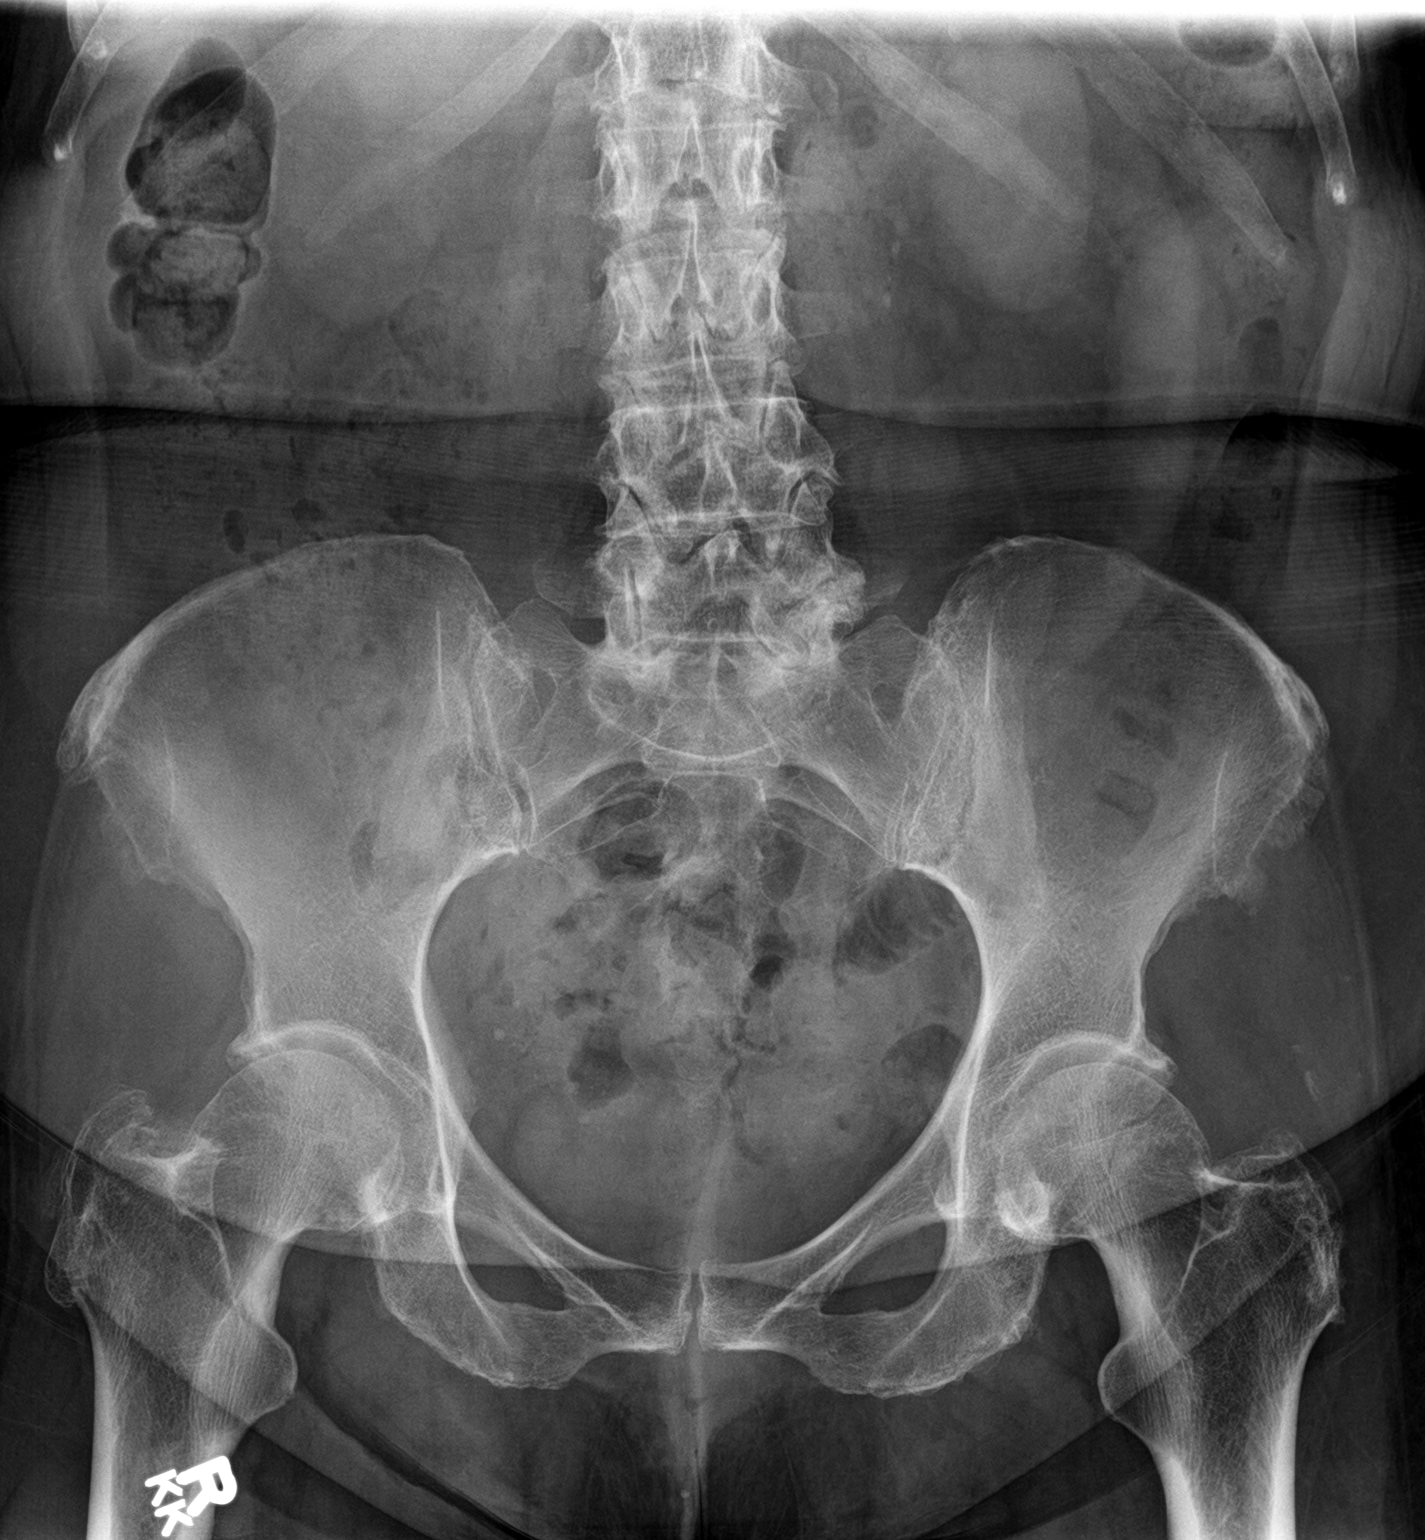
[im 2/2]
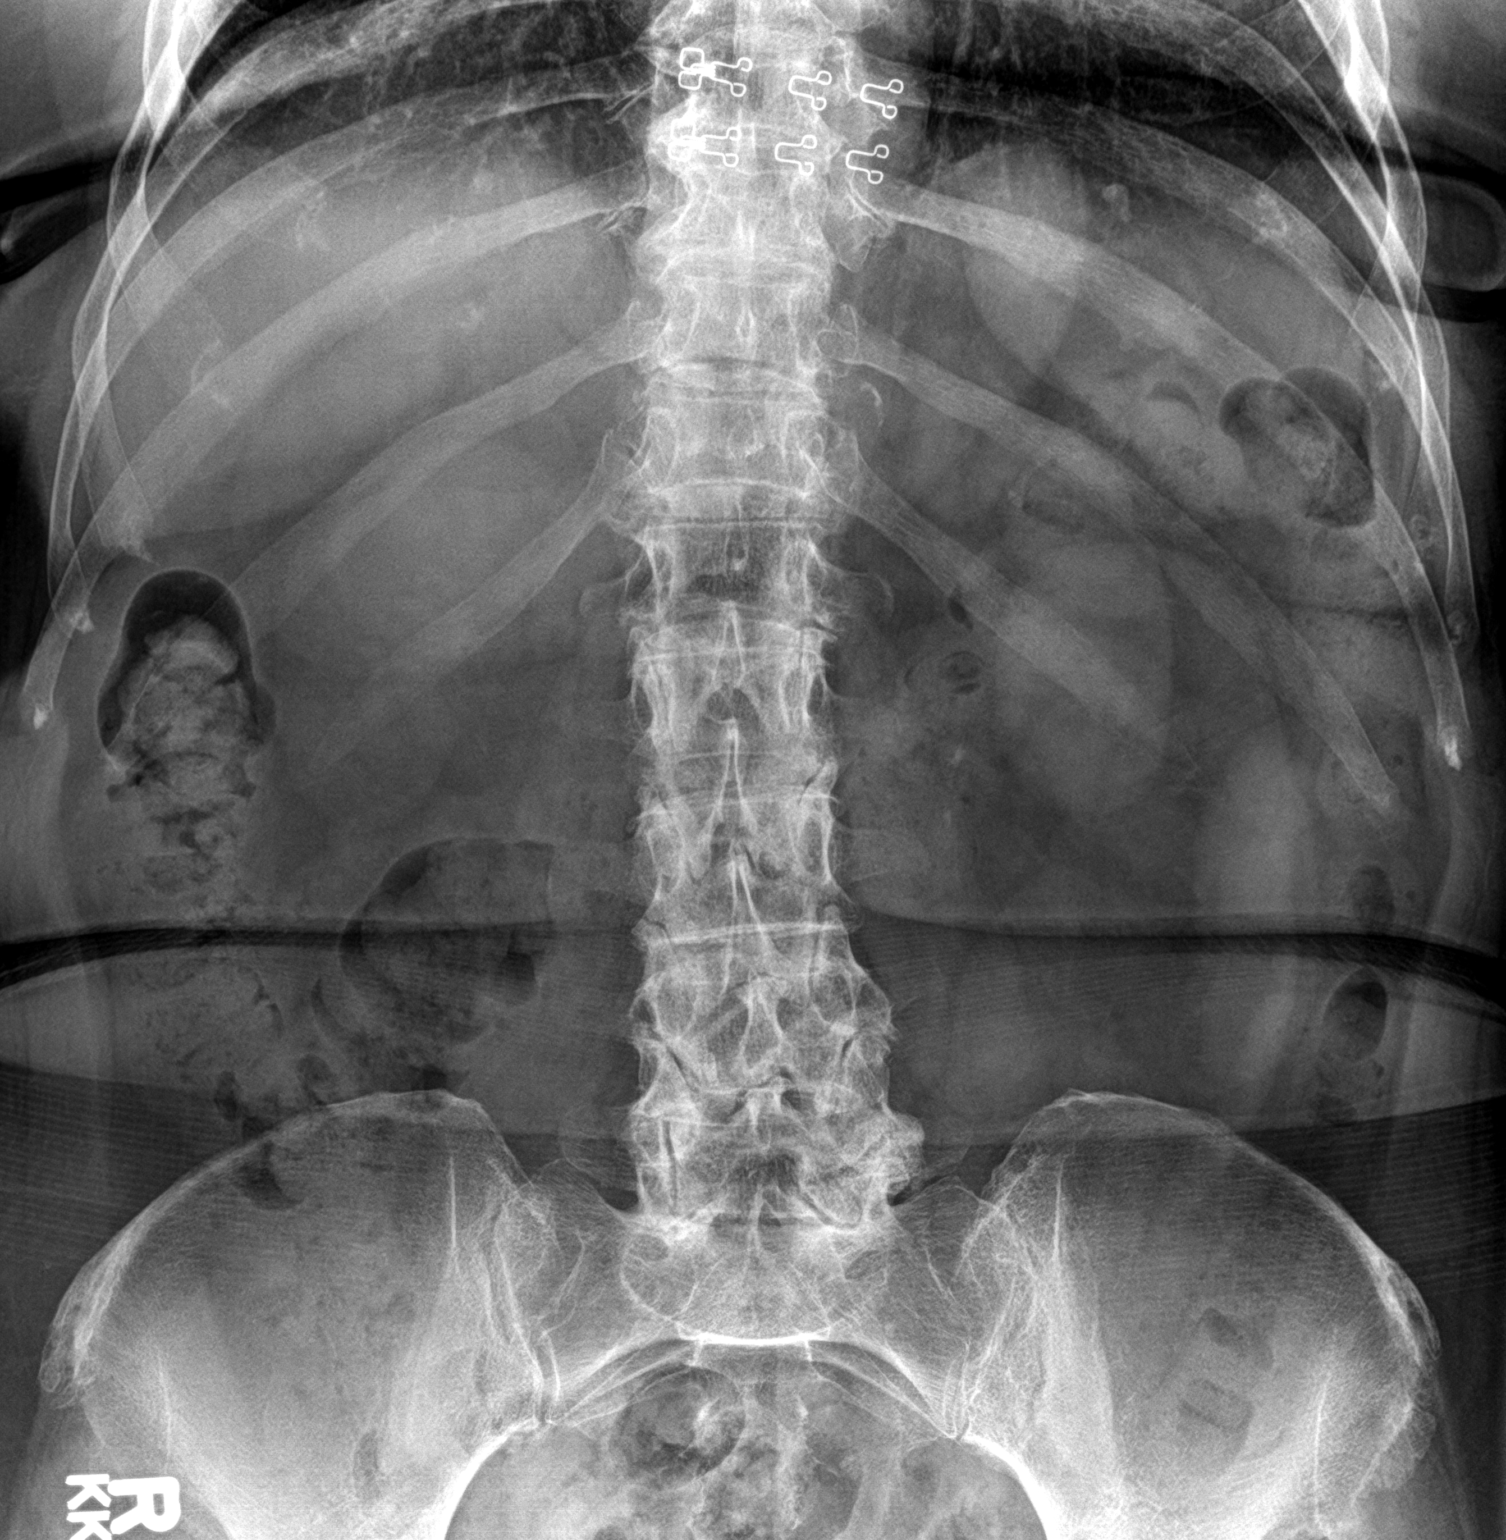

[2 of 2 positions shown; findings below may reference images not displayed]

FINDINGS: Two calcifications in the left mid abdomen are identified. The more
superior is medial to the lower pole the left kidney and the more
inferior is inferior to the kidney. These may represent left
ureteral stones. The larger more superior density measures 4 mm. A
possible stone is identified in the upper pole the left kidney. No
other renal or ureteral stones identified. Calcifications in the
pelvis are stable.
IMPRESSION: 1. Two calcifications in the left abdomen suspicious for ureteral
stones measuring up to 4 mm. CT imaging could better evaluate.
2. Possible stone in the upper pole of the left kidney.
3. No other abnormalities.

These results will be called to the ordering clinician or
representative by the Radiologist Assistant, and communication
documented in the PACS or [REDACTED].

## 2023-03-13 ENCOUNTER — Other Ambulatory Visit: Payer: Self-pay | Admitting: Family Medicine

## 2023-03-13 DIAGNOSIS — F330 Major depressive disorder, recurrent, mild: Secondary | ICD-10-CM

## 2023-03-16 ENCOUNTER — Telehealth: Payer: Medicare PPO | Admitting: Physician Assistant

## 2023-03-16 DIAGNOSIS — B359 Dermatophytosis, unspecified: Secondary | ICD-10-CM

## 2023-03-17 MED ORDER — KETOCONAZOLE 2 % EX CREA
1.0000 | TOPICAL_CREAM | Freq: Every day | CUTANEOUS | 0 refills | Status: DC
Start: 2023-03-17 — End: 2023-09-23

## 2023-03-17 NOTE — Progress Notes (Signed)
E Visit for Rash  We are sorry that you are not feeling well. Here is how we plan to help!  Based upon your presentation it appears you have a fungal infection.  I have prescribed: Ketoconazole cream 2% Apply to affected area daily for up to 2-4 weeks if needed  HOME CARE:  Take cool showers and avoid direct sunlight. Apply cool compress or wet dressings. Take a bath in an oatmeal bath.  Sprinkle content of one Aveeno packet under running faucet with comfortably warm water.  Bathe for 15-20 minutes, 1-2 times daily.  Pat dry with a towel. Do not rub the rash. Use hydrocortisone cream. Take an antihistamine like Benadryl for widespread rashes that itch.  The adult dose of Benadryl is 25-50 mg by mouth 4 times daily. Caution:  This type of medication may cause sleepiness.  Do not drink alcohol, drive, or operate dangerous machinery while taking antihistamines.  Do not take these medications if you have prostate enlargement.  Read package instructions thoroughly on all medications that you take.  GET HELP RIGHT AWAY IF:  Symptoms don't go away after treatment. Severe itching that persists. If you rash spreads or swells. If you rash begins to smell. If it blisters and opens or develops a yellow-brown crust. You develop a fever. You have a sore throat. You become short of breath.  MAKE SURE YOU:  Understand these instructions. Will watch your condition. Will get help right away if you are not doing well or get worse.  Thank you for choosing an e-visit.  Your e-visit answers were reviewed by a board certified advanced clinical practitioner to complete your personal care plan. Depending upon the condition, your plan could have included both over the counter or prescription medications.  Please review your pharmacy choice. Make sure the pharmacy is open so you can pick up prescription now. If there is a problem, you may contact your provider through [employer] and have the  prescription routed to another pharmacy.  Your safety is important to [place]. If you have drug allergies check your prescription carefully.   For the next 24 hours you can use MyChart to ask questions about today's visit, request a non-urgent call back, or ask for a work or school excuse. You will get an email in the next two days asking about your experience. I hope that your e-visit has been valuable and will speed your recovery.  I have spent 5 minutes in review of e-visit questionnaire, review and updating patient chart, medical decision making and response to patient.   Margaretann Loveless, PA-C

## 2023-03-26 DIAGNOSIS — H903 Sensorineural hearing loss, bilateral: Secondary | ICD-10-CM | POA: Diagnosis not present

## 2023-04-07 ENCOUNTER — Other Ambulatory Visit: Payer: Self-pay | Admitting: Family Medicine

## 2023-04-07 DIAGNOSIS — F330 Major depressive disorder, recurrent, mild: Secondary | ICD-10-CM

## 2023-04-08 ENCOUNTER — Encounter (HOSPITAL_BASED_OUTPATIENT_CLINIC_OR_DEPARTMENT_OTHER): Payer: Medicare PPO | Admitting: Internal Medicine

## 2023-04-08 DIAGNOSIS — E119 Type 2 diabetes mellitus without complications: Secondary | ICD-10-CM | POA: Diagnosis not present

## 2023-04-08 DIAGNOSIS — E785 Hyperlipidemia, unspecified: Secondary | ICD-10-CM | POA: Diagnosis not present

## 2023-04-08 DIAGNOSIS — N200 Calculus of kidney: Secondary | ICD-10-CM | POA: Diagnosis not present

## 2023-04-08 DIAGNOSIS — Z79899 Other long term (current) drug therapy: Secondary | ICD-10-CM | POA: Diagnosis not present

## 2023-04-08 DIAGNOSIS — I10 Essential (primary) hypertension: Secondary | ICD-10-CM | POA: Diagnosis not present

## 2023-04-08 DIAGNOSIS — N1831 Chronic kidney disease, stage 3a: Secondary | ICD-10-CM | POA: Diagnosis not present

## 2023-04-08 LAB — PROTEIN / CREATININE RATIO, URINE
Albumin, U: 13
Creatinine, Urine: 123

## 2023-04-08 LAB — MICROALBUMIN / CREATININE URINE RATIO: Microalb Creat Ratio: 106

## 2023-04-08 NOTE — Telephone Encounter (Signed)
Requested medication (s) are due for refill today: Due 04/13/23  Requested medication (s) are on the active medication list: yes    Last refill: 03/13/23  #30  0 refills  Future visit scheduled yes 01/14/24  Notes to clinic: Failed due to labs, please review. Thank you.  Requested Prescriptions  Pending Prescriptions Disp Refills   sertraline (ZOLOFT) 100 MG tablet [Pharmacy Med Name: SERTRALINE HCL 100 MG TABLET] 90 tablet 1    Sig: Take 1 tablet (100 mg total) by mouth daily. Courtesy refill; due for office visit     Psychiatry:  Antidepressants - SSRI - sertraline Failed - 04/07/2023  2:31 PM      Failed - AST in normal range and within 360 days    AST  Date Value Ref Range Status  01/07/2022 19 0 - 40 IU/L Final   SGOT(AST)  Date Value Ref Range Status  10/29/2014 58 (H) U/L Final    Comment:    15-41 NOTE: New Reference Range  10/04/14          Failed - ALT in normal range and within 360 days    ALT  Date Value Ref Range Status  01/07/2022 20 0 - 32 IU/L Final   SGPT (ALT)  Date Value Ref Range Status  10/29/2014 75 (H) U/L Final    Comment:    14-54 NOTE: New Reference Range  10/04/14          Passed - Completed PHQ-2 or PHQ-9 in the last 360 days      Passed - Valid encounter within last 6 months    Recent Outpatient Visits           1 year ago Medicare annual wellness visit, subsequent   [facility] Merita Norton T, FNP   1 year ago Hypertension associated with diabetes [facility])   [place] [place], Marzella Schlein, MD   3 years ago Annual physical exam   [facility] [place], Alessandra [place], [place]   4 years ago Annual physical exam   [facility] [place], [place]andra [place], [place]   5 years ago Annual physical exam   [facility] [place], [place], [place]

## 2023-04-09 LAB — RENAL FUNCTION PANEL
Albumin: 4.5
BUN/Creatinine Ratio: 12
BUN: 15
Bicarbonate: 28 (ref 20–28)
Calcium: 9.6
Chloride: 105
Creatinine: 1.22 — ABNORMAL HIGH
Glucose: 131 — ABNORMAL HIGH
Phosphorus: 4
Potassium: 4.5
Sodium: 141 (ref 137–147)
eGFR: 47 — ABNORMAL LOW

## 2023-04-09 LAB — PTH, INTACT: PTH: 45

## 2023-04-11 ENCOUNTER — Other Ambulatory Visit: Payer: Self-pay | Admitting: Family Medicine

## 2023-04-11 DIAGNOSIS — F330 Major depressive disorder, recurrent, mild: Secondary | ICD-10-CM

## 2023-04-15 DIAGNOSIS — I10 Essential (primary) hypertension: Secondary | ICD-10-CM | POA: Diagnosis not present

## 2023-04-15 DIAGNOSIS — N200 Calculus of kidney: Secondary | ICD-10-CM | POA: Diagnosis not present

## 2023-04-15 DIAGNOSIS — E785 Hyperlipidemia, unspecified: Secondary | ICD-10-CM | POA: Diagnosis not present

## 2023-04-15 DIAGNOSIS — E119 Type 2 diabetes mellitus without complications: Secondary | ICD-10-CM | POA: Diagnosis not present

## 2023-04-15 DIAGNOSIS — N1831 Chronic kidney disease, stage 3a: Secondary | ICD-10-CM | POA: Diagnosis not present

## 2023-04-15 LAB — PROTEIN, URINE, RANDOM
Creatinine: 123
Protein, Urine: 13
Protein/Creatinine Ratio: 106

## 2023-04-15 LAB — URINALYSIS
Glucose, Ur: NEGATIVE
Hemoglobin, Urine: NEGATIVE
Ketones, urine: NEGATIVE
Protein, Urine: NEGATIVE
Specific Gravity: 1.013
pH, Urine: 5.5

## 2023-04-23 DIAGNOSIS — M2352 Chronic instability of knee, left knee: Secondary | ICD-10-CM | POA: Diagnosis not present

## 2023-05-23 DIAGNOSIS — M25562 Pain in left knee: Secondary | ICD-10-CM | POA: Diagnosis not present

## 2023-05-23 LAB — CBC WITH DIFFERENTIAL
BASO(ABSOLUTE): 63
Basophils Relative: 1 (ref 0–3)
Eosinophil %: 5.5
Eosinophils Absolute: 435
HCT: 42 — AB (ref 29–41)
Hemoglobin: 14.2
Lymphocyte %: 28.6
Lymphocytes Absolute: 2259 /uL
MCH: 29
MCHC: 33.6
MCV: 86.1 (ref 76–111)
MPV: 10 fL (ref 7.8–11.0)
Monocyte %: 6.3
Monocytes(Absolute): 498
Neutro Abs: 4645
Neutrophils relative % (GR): 58.8 % (ref 44.0–76.0)
Platelets: 258
RBC: 4.9 (ref 3.87–5.11)
RDW: 14
WBC: 7.9

## 2023-05-30 ENCOUNTER — Telehealth: Payer: Self-pay | Admitting: Pharmacy Technician

## 2023-05-30 NOTE — Telephone Encounter (Signed)
Auth Submission: APPROVED Site of care: Site of care: [facility] Payer: [employer] MEDICARE Medication & CPT/J Code(s) submitted: Leqvio (Inclisiran) [id number] Route of submission (phone, fax, portal): PORTAL Phone # Fax # Auth type: Buy/Bill PB Units/visits requested: X2 Reference number: [id number] Approval from: 07/30/23 to 07/28/24

## 2023-06-09 ENCOUNTER — Ambulatory Visit: Payer: Medicare PPO

## 2023-06-12 DIAGNOSIS — M25562 Pain in left knee: Secondary | ICD-10-CM | POA: Diagnosis not present

## 2023-06-19 DIAGNOSIS — M25562 Pain in left knee: Secondary | ICD-10-CM | POA: Diagnosis not present

## 2023-08-20 ENCOUNTER — Ambulatory Visit: Payer: Medicare PPO | Admitting: Family Medicine

## 2023-08-20 VITALS — BP 148/72 | HR 75 | Resp 18 | Ht 62.0 in | Wt 176.3 lb

## 2023-08-20 DIAGNOSIS — N1831 Chronic kidney disease, stage 3a: Secondary | ICD-10-CM | POA: Diagnosis not present

## 2023-08-20 DIAGNOSIS — E1122 Type 2 diabetes mellitus with diabetic chronic kidney disease: Secondary | ICD-10-CM | POA: Diagnosis not present

## 2023-08-20 DIAGNOSIS — F330 Major depressive disorder, recurrent, mild: Secondary | ICD-10-CM | POA: Diagnosis not present

## 2023-08-20 DIAGNOSIS — E1169 Type 2 diabetes mellitus with other specified complication: Secondary | ICD-10-CM

## 2023-08-20 DIAGNOSIS — E559 Vitamin D deficiency, unspecified: Secondary | ICD-10-CM | POA: Diagnosis not present

## 2023-08-20 DIAGNOSIS — E785 Hyperlipidemia, unspecified: Secondary | ICD-10-CM

## 2023-08-20 MED ORDER — SERTRALINE HCL 25 MG PO TABS
25.0000 mg | ORAL_TABLET | Freq: Every day | ORAL | 3 refills | Status: DC
Start: 2023-08-20 — End: 2023-09-16

## 2023-08-20 NOTE — Progress Notes (Signed)
Established patient visit   Patient: [name] [name]   DOB: 04-11-1951   73 y.o. Female  MRN: [id number] Visit Date: 08/20/2023  Today's healthcare provider: Sherlyn Hay, DO   Chief Complaint  Patient presents with   Depression   Subjective    HPI The patient, with a long-standing history of anxiety managed on Sertraline, presents with concerns that the medication may not be as effective as it once was. She reports that despite being on Sertraline for over two decades, she has recently noticed that it does not seem to be taking the edge off her anxiety as it used to. The patient denies any changes in her medication regimen and has not tried any other medications for her anxiety.  The patient also has a history of high cholesterol, which she has attempted to manage with various medications in the past, including statins and ezetimibe, but experienced muscle aches with both. She also tried an injectable medication, but developed a localized reaction at the injection site. The patient has decided not to pursue further treatment for her cholesterol due to these side effects.  In addition to her anxiety and cholesterol, the patient has a history of knee problems. She reports a torn meniscus, bone-on-bone contact, and arthritis spurs in her left knee, for which she is scheduled to undergo a knee replacement at the end of February.  The patient also reports a history of shingles, which affected the entire left side of her body. She notes that she still experiences discomfort in this area, particularly in her left leg. She also reports a persistent ringworm infection on her arm and leg, which she has been treating with ketoconazole cream for the past seven months.  Lastly, the patient has a history of diabetes, which is diet-controlled. She reports occasional drops in her blood sugar levels, which she manages by eating something to bring it back up. She does not regularly check her blood  sugars at home, only when she feels it is necessary.     Medications: Outpatient Medications Prior to Visit  Medication Sig   aspirin EC 81 MG tablet Take 81 mg by mouth. Takes a few times weekly. Swallow whole.   Cholecalciferol (VITAMIN D3) 2000 units TABS Take 1 capsule by mouth daily.   ketoconazole (NIZORAL) 2 % cream Apply 1 Application topically daily.   sertraline (ZOLOFT) 100 MG tablet Take 1 tablet (100 mg total) by mouth daily. Courtesy refill; due for office visit   [DISCONTINUED] inclisiran (LEQVIO) 284 MG/1.5ML SOSY injection Inject 284 mg into the skin as directed. (Patient not taking: Reported on 01/13/2023)   [DISCONTINUED] meloxicam (MOBIC) 15 MG tablet Take 15 mg by mouth as needed for pain.   No facility-administered medications prior to visit.        Objective    BP (!) 148/72 (BP Location: Left Arm, Patient Position: Sitting, Cuff Size: Large)   Pulse 75   Resp 18   Ht 5\' 2"  (1.575 m)   Wt 176 lb 4.8 oz (80 kg)   SpO2 98%   BMI 32.25 kg/m     Physical Exam Vitals and nursing note reviewed.  Constitutional:      General: She is not in acute distress.    Appearance: Normal appearance.  HENT:     Head: Normocephalic and atraumatic.  Eyes:     General: No scleral icterus.    Conjunctiva/sclera: Conjunctivae normal.  Cardiovascular:     Rate and Rhythm: Normal  rate.  Pulmonary:     Effort: Pulmonary effort is normal.  Neurological:     Mental Status: She is alert and oriented to person, place, and time. Mental status is at baseline.  Psychiatric:        Mood and Affect: Mood normal.        Behavior: Behavior normal.      Results for orders placed or performed in visit on 08/20/23  Renal function panel  Result Value Ref Range   Glucose 131 (H)    BUN 15    Creatinine 1.22 (H)    eGFR 47 (L)    BUN/Creatinine Ratio 12    Sodium 141 137 - 147   Potassium 4.5    Chloride 105    Bicarbonate 28 20 - 28   Calcium 9.6    Phosphorus 4     Albumin 4.5   PTH, intact  Result Value Ref Range   PTH 45   CBC With Differential  Result Value Ref Range   WBC 7.9    RBC 4.9 3.87 - 5.11   Hemoglobin 14.2    HCT 42 (A) 29 - 41   MCV 86.1 76 - 111   MCH 29    MCHC 33.6    RDW 14    Platelets 258    MPV 10.0 7.8 - 11.0 fL   Neutro Abs 4,645    Lymphocytes Absolute 2,259 /L   Monocytes(Absolute) 498    Eosinophils Absolute 435.0    BASO(ABSOLUTE) 63    Neutrophils relative % (GR) 58.8 44.0 - 76.0 %   Lymphocyte % 28.6    Monocyte % 6.3    Eosinophil % 5.5    Basophils Relative 1 0 - 3  Urinalysis  Result Value Ref Range   Color Yellow    Appearance Clear    Specific Gravity 1.013    pH, Urine 5.5    Glucose, Ur Negative    Ketones, urine Negative    Hemoglobin, Urine Negative    Protein, Urine Negative   Protein, urine, random  Result Value Ref Range   Creatinine 123    Protein/Creatinine Ratio 106    Protein, Urine 13     Assessment & Plan    Type 2 diabetes mellitus with stage 3a chronic kidney disease, without long-term current use of insulin (HCC) Assessment & Plan: Type 2 diabetes; diet-controlled. Monitors blood glucose levels at home and reports occasional hypoglycemia. Due for an A1c test. - Complete A1c test   Orders: -     Hemoglobin A1c  Hyperlipidemia associated with type 2 diabetes mellitus (HCC) Assessment & Plan: Chronic hyperlipidemia with adverse reactions to statins and ezetimibe. Experienced significant side effects from Leqvio injections, including localized reactions. Prefers to avoid further medication, accepting the risks associated with untreated hyperlipidemia. - No further treatment for hyperlipidemia as per patient's preference   Vitamin D deficiency Assessment & Plan: Will recheck level today.  Orders: -     VITAMIN D 25 Hydroxy (Vit-D Deficiency, Fractures)  Mild episode of recurrent major depressive disorder (HCC) Assessment & Plan: Long-standing depression managed  with sertraline 100 mg for over 20 years. Recently, the medication is less effective, possibly due to increased stress and workload. Discussed increasing the sertraline dose, given the history of success and potential for trial and error with other medications. Explained that increasing the dose may help manage symptoms better and can be reassessed later. - Increase sertraline dose by 25 mg - Follow up  in 4 weeks to assess response  Orders: -     Sertraline HCl; Take 1 tablet (25 mg total) by mouth daily.  Dispense: 30 tablet; Refill: 3  Knee Osteoarthritis with Meniscus Tear Severe osteoarthritis in the left knee with a torn meniscus and bone spurs. Scheduled for knee replacement surgery at the end of February. - Proceed with scheduled knee replacement surgery  Ringworm Persistent ringworm infection on the arm and leg, treated with ketoconazole cream for seven months with partial improvement. Has an upcoming dermatology appointment. - Continue ketoconazole cream - Attend dermatology appointment on Friday  General Health Maintenance Due for several routine health maintenance items, including vaccinations and screenings. - Order blood work including A1c and vitamin D levels - Obtain tetanus vaccine at pharmacy - Complete at-home colon cancer screening  Follow-up - Follow up in 4 weeks for sertraline dose adjustment       - Virtual follow-up if needed post knee surgery - Bring home blood pressure cuff to next appointment for comparison.  Return in about 4 weeks (around 09/17/2023) for Anx/Dep.      I discussed the assessment and treatment plan with the patient  The patient was provided an opportunity to ask questions and all were answered. The patient agreed with the plan and demonstrated an understanding of the instructions.   The patient was advised to call back or seek an in-person evaluation if the symptoms worsen or if the condition fails to improve as anticipated.    Sherlyn Hay, DO  [facility] [phone] (phone) [phone] (fax)  [facility]

## 2023-08-20 NOTE — Patient Instructions (Signed)
Recommend Tdap (tetanus vaccine) to get at the pharmacy.

## 2023-08-22 DIAGNOSIS — D2262 Melanocytic nevi of left upper limb, including shoulder: Secondary | ICD-10-CM | POA: Diagnosis not present

## 2023-08-22 DIAGNOSIS — L821 Other seborrheic keratosis: Secondary | ICD-10-CM | POA: Diagnosis not present

## 2023-08-22 DIAGNOSIS — D2261 Melanocytic nevi of right upper limb, including shoulder: Secondary | ICD-10-CM | POA: Diagnosis not present

## 2023-08-22 DIAGNOSIS — C44622 Squamous cell carcinoma of skin of right upper limb, including shoulder: Secondary | ICD-10-CM | POA: Diagnosis not present

## 2023-08-22 DIAGNOSIS — D485 Neoplasm of uncertain behavior of skin: Secondary | ICD-10-CM | POA: Diagnosis not present

## 2023-08-22 DIAGNOSIS — Z08 Encounter for follow-up examination after completed treatment for malignant neoplasm: Secondary | ICD-10-CM | POA: Diagnosis not present

## 2023-08-22 DIAGNOSIS — Z85828 Personal history of other malignant neoplasm of skin: Secondary | ICD-10-CM | POA: Diagnosis not present

## 2023-08-22 DIAGNOSIS — D225 Melanocytic nevi of trunk: Secondary | ICD-10-CM | POA: Diagnosis not present

## 2023-08-24 ENCOUNTER — Encounter: Payer: Self-pay | Admitting: Family Medicine

## 2023-08-24 NOTE — Assessment & Plan Note (Signed)
Chronic hyperlipidemia with adverse reactions to statins and ezetimibe. Experienced significant side effects from Leqvio injections, including localized reactions. Prefers to avoid further medication, accepting the risks associated with untreated hyperlipidemia. - No further treatment for hyperlipidemia as per patient's preference

## 2023-08-24 NOTE — Assessment & Plan Note (Signed)
Long-standing depression managed with sertraline 100 mg for over 20 years. Recently, the medication is less effective, possibly due to increased stress and workload. Discussed increasing the sertraline dose, given the history of success and potential for trial and error with other medications. Explained that increasing the dose may help manage symptoms better and can be reassessed later. - Increase sertraline dose by 25 mg - Follow up in 4 weeks to assess response

## 2023-08-24 NOTE — Assessment & Plan Note (Signed)
Will recheck level today.

## 2023-08-24 NOTE — Assessment & Plan Note (Signed)
>>  ASSESSMENT AND PLAN FOR DIABETES MELLITUS, TYPE 2 (HCC) WRITTEN ON 08/24/2023  9:25 PM BY Andres Escandon N, DO  Type 2 diabetes; diet-controlled. Monitors blood glucose levels at home and reports occasional hypoglycemia. Due for an A1c test. - Complete A1c test

## 2023-08-24 NOTE — Assessment & Plan Note (Signed)
Type 2 diabetes; diet-controlled. Monitors blood glucose levels at home and reports occasional hypoglycemia. Due for an A1c test. - Complete A1c test

## 2023-08-25 DIAGNOSIS — H524 Presbyopia: Secondary | ICD-10-CM | POA: Diagnosis not present

## 2023-08-25 LAB — HM DIABETES EYE EXAM

## 2023-08-26 DIAGNOSIS — E559 Vitamin D deficiency, unspecified: Secondary | ICD-10-CM | POA: Diagnosis not present

## 2023-08-26 DIAGNOSIS — N1831 Chronic kidney disease, stage 3a: Secondary | ICD-10-CM | POA: Diagnosis not present

## 2023-08-26 DIAGNOSIS — E1122 Type 2 diabetes mellitus with diabetic chronic kidney disease: Secondary | ICD-10-CM | POA: Diagnosis not present

## 2023-08-27 LAB — VITAMIN D 25 HYDROXY (VIT D DEFICIENCY, FRACTURES): Vit D, 25-Hydroxy: 68.7 ng/mL (ref 30.0–100.0)

## 2023-08-27 LAB — HEMOGLOBIN A1C
Est. average glucose Bld gHb Est-mCnc: 163 mg/dL
Hgb A1c MFr Bld: 7.3 % — ABNORMAL HIGH (ref 4.8–5.6)

## 2023-09-01 ENCOUNTER — Ambulatory Visit: Payer: Self-pay | Admitting: Orthopedic Surgery

## 2023-09-08 ENCOUNTER — Encounter: Payer: Self-pay | Admitting: Family Medicine

## 2023-09-12 ENCOUNTER — Other Ambulatory Visit: Payer: Self-pay | Admitting: Family Medicine

## 2023-09-12 DIAGNOSIS — F330 Major depressive disorder, recurrent, mild: Secondary | ICD-10-CM

## 2023-09-25 NOTE — Patient Instructions (Addendum)
 SURGICAL WAITING ROOM VISITATION Patients having surgery or a procedure may have no more than 2 support people in the waiting area - these visitors may rotate.    Children under the age of 2 must have an adult with them who is not the patient.  Due to an increase in RSV and influenza rates and associated hospitalizations, children ages 11 and under may not visit patients in [facility] hospitals.   If the patient needs to stay at the hospital during part of their recovery, the visitor guidelines for inpatient rooms apply. Pre-op nurse will coordinate an appropriate time for 1 support person to accompany patient in pre-op.  This support person may not rotate.    Please refer to the [facility] website for the visitor guidelines for Inpatients (after your surgery is over and you are in a regular room).       Your procedure is scheduled on: 10-10-23   Report to [facility] Main Entrance    Report to admitting at 10:00 AM   Call this number if you have problems the morning of surgery [phone]   Do not eat food :After Midnight.   After Midnight you may have the following liquids until 9:30 AM DAY OF SURGERY  Water Non-Citrus Juices (without pulp, NO RED-Apple, White grape, White cranberry) Black Coffee (NO MILK/CREAM OR CREAMERS, sugar ok)  Clear Tea (NO MILK/CREAM OR CREAMERS, sugar ok) regular and decaf                             Plain Jell-O (NO RED)                                           Fruit ices (not with fruit pulp, NO RED)                                     Popsicles (NO RED)                                                               Sports drinks like Gatorade (NO RED)                   The day of surgery:  Drink ONE (1) Pre-Surgery G2 by 9:30 AM the morning of surgery. Drink in one sitting. Do not sip.  This drink was given to you during your hospital  pre-op appointment visit. Nothing else to drink after completing the Pre-Surgery G2.           If you have questions, please contact your surgeon's office.   FOLLOW  ANY ADDITIONAL PRE OP INSTRUCTIONS YOU RECEIVED FROM YOUR SURGEON'S OFFICE!!!     Oral Hygiene is also important to reduce your risk of infection.                                    Remember - BRUSH YOUR TEETH THE MORNING OF SURGERY WITH YOUR REGULAR TOOTHPASTE   Do  NOT smoke after Midnight   Take these medicines the morning of surgery with A SIP OF WATER:    None  Stop all vitamins and herbal supplements 7 days before surgery                              You may not have any metal on your body including hair pins, jewelry, and body piercing             Do not wear make-up, lotions, powders, perfumes, or deodorant  Do not wear nail polish including gel and S&S, artificial/acrylic nails, or any other type of covering on natural nails including finger and toenails. If you have artificial nails, gel coating, etc. that needs to be removed by a nail salon please have this removed prior to surgery or surgery may need to be canceled/ delayed if the surgeon/ anesthesia feels like they are unable to be safely monitored.   Do not shave  48 hours prior to surgery.           Do not bring valuables to the hospital. [place] IS NOT RESPONSIBLE   FOR VALUABLES.   Contacts, dentures or bridgework may not be worn into surgery.   Bring small overnight bag day of surgery.   DO NOT BRING YOUR HOME MEDICATIONS TO THE HOSPITAL. PHARMACY WILL DISPENSE MEDICATIONS LISTED ON YOUR MEDICATION LIST TO YOU DURING YOUR ADMISSION IN THE HOSPITAL!    Special Instructions: Bring a copy of your healthcare power of attorney and living will documents the day of surgery if you haven't scanned them before.              Please read over the following fact sheets you were given: IF YOU HAVE QUESTIONS ABOUT YOUR PRE-OP INSTRUCTIONS PLEASE CALL [phone] Gwen  If you received a COVID test during your pre-op visit  it is requested that you wear a  mask when out in public, stay away from anyone that may not be feeling well and notify your surgeon if you develop symptoms. If you test positive for Covid or have been in contact with anyone that has tested positive in the last 10 days please notify you surgeon.    Pre-operative 5 CHG Bath Instructions   You can play a key role in reducing the risk of infection after surgery. Your skin needs to be as free of germs as possible. You can reduce the number of germs on your skin by washing with CHG (chlorhexidine gluconate) soap before surgery. CHG is an antiseptic soap that kills germs and continues to kill germs even after washing.   DO NOT use if you have an allergy to chlorhexidine/CHG or antibacterial soaps. If your skin becomes reddened or irritated, stop using the CHG and notify one of our RNs at [phone].   Please shower with the CHG soap starting 4 days before surgery using the following schedule:     Please keep in mind the following:  DO NOT shave, including legs and underarms, starting the day of your first shower.   You may shave your face at any point before/day of surgery.  Place clean sheets on your bed the day you start using CHG soap. Use a clean washcloth (not used since being washed) for each shower. DO NOT sleep with pets once you start using the CHG.   CHG Shower Instructions:  If you choose to wash your hair and private area, wash  first with your normal shampoo/soap.  After you use shampoo/soap, rinse your hair and body thoroughly to remove shampoo/soap residue.  Turn the water OFF and apply about 3 tablespoons (45 ml) of CHG soap to a CLEAN washcloth.  Apply CHG soap ONLY FROM YOUR NECK DOWN TO YOUR TOES (washing for 3-5 minutes)  DO NOT use CHG soap on face, private areas, open wounds, or sores.  Pay special attention to the area where your surgery is being performed.  If you are having back surgery, having someone wash your back for you may be helpful. Wait 2  minutes after CHG soap is applied, then you may rinse off the CHG soap.  Pat dry with a clean towel  Put on clean clothes/pajamas   If you choose to wear lotion, please use ONLY the CHG-compatible lotions on the back of this paper.     Additional instructions for the day of surgery: DO NOT APPLY any lotions, deodorants, cologne, or perfumes.   Put on clean/comfortable clothes.  Brush your teeth.  Ask your nurse before applying any prescription medications to the skin.      CHG Compatible Lotions   Aveeno Moisturizing lotion  Cetaphil Moisturizing Cream  Cetaphil Moisturizing Lotion  Clairol Herbal Essence Moisturizing Lotion, Dry Skin  Clairol Herbal Essence Moisturizing Lotion, Extra Dry Skin  Clairol Herbal Essence Moisturizing Lotion, Normal Skin  Curel Age Defying Therapeutic Moisturizing Lotion with Alpha Hydroxy  Curel Extreme Care Body Lotion  Curel Soothing Hands Moisturizing Hand Lotion  Curel Therapeutic Moisturizing Cream, Fragrance-Free  Curel Therapeutic Moisturizing Lotion, Fragrance-Free  Curel Therapeutic Moisturizing Lotion, Original Formula  Eucerin Daily Replenishing Lotion  Eucerin Dry Skin Therapy Plus Alpha Hydroxy Crme  Eucerin Dry Skin Therapy Plus Alpha Hydroxy Lotion  Eucerin Original Crme  Eucerin Original Lotion  Eucerin Plus Crme Eucerin Plus Lotion  Eucerin TriLipid Replenishing Lotion  Keri Anti-Bacterial Hand Lotion  Keri Deep Conditioning Original Lotion Dry Skin Formula Softly Scented  Keri Deep Conditioning Original Lotion, Fragrance Free Sensitive Skin Formula  Keri Lotion Fast Absorbing Fragrance Free Sensitive Skin Formula  Keri Lotion Fast Absorbing Softly Scented Dry Skin Formula  Keri Original Lotion  Keri Skin Renewal Lotion Keri Silky Smooth Lotion  Keri Silky Smooth Sensitive Skin Lotion  Nivea Body Creamy Conditioning Oil  Nivea Body Extra Enriched Lotion  Nivea Body Original Lotion  Nivea Body Sheer Moisturizing Lotion  Nivea Crme  Nivea Skin Firming Lotion  NutraDerm 30 Skin Lotion  NutraDerm Skin Lotion  NutraDerm Therapeutic Skin Cream  NutraDerm Therapeutic Skin Lotion  ProShield Protective Hand Cream  Provon moisturizing lotion   PATIENT SIGNATURE_________________________________  NURSE SIGNATURE__________________________________  ________________________________________________________________________    Rogelia Mire  An incentive spirometer is a tool that can help keep your lungs clear and active. This tool measures how well you are filling your lungs with each breath. Taking long deep breaths may help reverse or decrease the chance of developing breathing (pulmonary) problems (especially infection) following: A long period of time when you are unable to move or be active. BEFORE THE PROCEDURE  If the spirometer includes an indicator to show your best effort, your nurse or respiratory therapist will set it to a desired goal. If possible, sit up straight or lean slightly forward. Try not to slouch. Hold the incentive spirometer in an upright position. INSTRUCTIONS FOR USE  Sit on the edge of your bed if possible, or sit up as far as you can in bed or on a chair. Hold the  incentive spirometer in an upright position. Breathe out normally. Place the mouthpiece in your mouth and seal your lips tightly around it. Breathe in slowly and as deeply as possible, raising the piston or the ball toward the top of the column. Hold your breath for 3-5 seconds or for as long as possible. Allow the piston or ball to fall to the bottom of the column. Remove the mouthpiece from your mouth and breathe out normally. Rest for a few seconds and repeat Steps 1 through 7 at least 10 times every 1-2 hours when you are awake. Take your time and take a few normal breaths between deep breaths. The spirometer may include an indicator to show your best effort. Use the indicator as a goal to work toward during each  repetition. After each set of 10 deep breaths, practice coughing to be sure your lungs are clear. If you have an incision (the cut made at the time of surgery), support your incision when coughing by placing a pillow or rolled up towels firmly against it. Once you are able to get out of bed, walk around indoors and cough well. You may stop using the incentive spirometer when instructed by your caregiver.  RISKS AND COMPLICATIONS Take your time so you do not get dizzy or light-headed. If you are in pain, you may need to take or ask for pain medication before doing incentive spirometry. It is harder to take a deep breath if you are having pain. AFTER USE Rest and breathe slowly and easily. It can be helpful to keep track of a log of your progress. Your caregiver can provide you with a simple table to help with this. If you are using the spirometer at home, follow these instructions: SEEK MEDICAL CARE IF:  You are having difficultly using the spirometer. You have trouble using the spirometer as often as instructed. Your pain medication is not giving enough relief while using the spirometer. You develop fever of 100.5 F (38.1 C) or higher. SEEK IMMEDIATE MEDICAL CARE IF:  You cough up bloody sputum that had not been present before. You develop fever of 102 F (38.9 C) or greater. You develop worsening pain at or near the incision site. MAKE SURE YOU:  Understand these instructions. Will watch your condition. Will get help right away if you are not doing well or get worse. Document Released: 11/25/2006 Document Revised: 10/07/2011 Document Reviewed: 01/26/2007 [facility] Patient Information 2014 [place], [place].   ________________________________________________________________________

## 2023-09-25 NOTE — Progress Notes (Addendum)
 COVID Vaccine Completed:  Yes  Date of COVID positive in last 90 days:  No  PCP - Royetta Crochet, DO Cardiologist - Zoila Shutter, MD for hypercholesterolemia Nephrologist - Lorain Childes, MD  Chest x-ray - N/A EKG - 09-29-23 Epic Stress Test - N/A ECHO - N/A Cardiac Cath - N/A Pacemaker/ICD device last checked: Spinal Cord Stimulator:N/A  Bowel Prep - N/A  Sleep Study - N/A CPAP -   Fasting Blood Sugar - 68 to 80 Checks Blood Sugar - checks very rarely   Last dose of GLP1 agonist-  N/A GLP1 instructions:  Hold 7 days before surgery    Last dose of SGLT-2 inhibitors-  N/A SGLT-2 instructions:  Hold 3 days before surgery   Blood Thinner Instructions: N/A   Time: Aspirin Instructions: Last Dose:  Activity level:  Can go up a flight of stairs and perform activities of daily living without stopping and without symptoms of chest pain or shortness of breath.  Anesthesia review: N/A  Patient denies shortness of breath, fever, cough and chest pain at PAT appointment  Patient verbalized understanding of instructions that were given to them at the PAT appointment. Patient was also instructed that they will need to review over the PAT instructions again at home before surgery.

## 2023-09-29 ENCOUNTER — Encounter (HOSPITAL_COMMUNITY)
Admission: RE | Admit: 2023-09-29 | Discharge: 2023-09-29 | Disposition: A | Discharge status: Home / Self Care | Payer: Medicare PPO | Source: Ambulatory Visit | Attending: Specialist | Admitting: Specialist

## 2023-09-29 ENCOUNTER — Other Ambulatory Visit: Payer: Self-pay

## 2023-09-29 ENCOUNTER — Encounter (HOSPITAL_COMMUNITY): Payer: Self-pay

## 2023-09-29 VITALS — BP 142/88 | HR 76 | Temp 98.1°F | Resp 16 | Ht 60.5 in | Wt 171.0 lb

## 2023-09-29 DIAGNOSIS — Z01818 Encounter for other preprocedural examination: Secondary | ICD-10-CM | POA: Diagnosis present

## 2023-09-29 DIAGNOSIS — K759 Inflammatory liver disease, unspecified: Secondary | ICD-10-CM | POA: Diagnosis not present

## 2023-09-29 DIAGNOSIS — I10 Essential (primary) hypertension: Secondary | ICD-10-CM

## 2023-09-29 DIAGNOSIS — E119 Type 2 diabetes mellitus without complications: Secondary | ICD-10-CM | POA: Insufficient documentation

## 2023-09-29 HISTORY — DX: Cytomegaloviral disease, unspecified: B25.9

## 2023-09-29 HISTORY — DX: Inflammatory liver disease, unspecified: K75.9

## 2023-09-29 HISTORY — DX: Personal history of urinary calculi: Z87.442

## 2023-09-29 LAB — CBC
HCT: 44.6 % (ref 36.0–46.0)
Hemoglobin: 14.5 g/dL (ref 12.0–15.0)
MCH: 28.9 pg (ref 26.0–34.0)
MCHC: 32.5 g/dL (ref 30.0–36.0)
MCV: 89 fL (ref 80.0–100.0)
Platelets: 225 10*3/uL (ref 150–400)
RBC: 5.01 MIL/uL (ref 3.87–5.11)
RDW: 14.1 % (ref 11.5–15.5)
WBC: 7.5 10*3/uL (ref 4.0–10.5)
nRBC: 0 % (ref 0.0–0.2)

## 2023-09-29 LAB — COMPREHENSIVE METABOLIC PANEL
ALT: 19 U/L (ref 0–44)
AST: 20 U/L (ref 15–41)
Albumin: 4.2 g/dL (ref 3.5–5.0)
Alkaline Phosphatase: 78 U/L (ref 38–126)
Anion gap: 8 (ref 5–15)
BUN: 17 mg/dL (ref 8–23)
CO2: 22 mmol/L (ref 22–32)
Calcium: 9.2 mg/dL (ref 8.9–10.3)
Chloride: 109 mmol/L (ref 98–111)
Creatinine, Ser: 1.13 mg/dL — ABNORMAL HIGH (ref 0.44–1.00)
GFR, Estimated: 52 mL/min — ABNORMAL LOW (ref >60)
Glucose, Bld: 196 mg/dL — ABNORMAL HIGH (ref 70–99)
Potassium: 3.7 mmol/L (ref 3.5–5.1)
Sodium: 139 mmol/L (ref 135–145)
Total Bilirubin: 0.8 mg/dL (ref 0.0–1.2)
Total Protein: 7.4 g/dL (ref 6.5–8.1)

## 2023-09-29 LAB — SURGICAL PCR SCREEN
MRSA, PCR: NEGATIVE
Staphylococcus aureus: NEGATIVE

## 2023-09-29 LAB — GLUCOSE, CAPILLARY: Glucose-Capillary: 246 mg/dL — ABNORMAL HIGH (ref 70–99)

## 2023-10-01 ENCOUNTER — Ambulatory Visit: Payer: Medicare PPO | Admitting: Family Medicine

## 2023-10-01 ENCOUNTER — Encounter: Payer: Self-pay | Admitting: Family Medicine

## 2023-10-01 VITALS — BP 141/75 | HR 76 | Ht 61.0 in | Wt 175.0 lb

## 2023-10-01 DIAGNOSIS — E7801 Familial hypercholesterolemia: Secondary | ICD-10-CM | POA: Diagnosis not present

## 2023-10-01 DIAGNOSIS — E1159 Type 2 diabetes mellitus with other circulatory complications: Secondary | ICD-10-CM

## 2023-10-01 DIAGNOSIS — E1122 Type 2 diabetes mellitus with diabetic chronic kidney disease: Secondary | ICD-10-CM

## 2023-10-01 DIAGNOSIS — F330 Major depressive disorder, recurrent, mild: Secondary | ICD-10-CM | POA: Diagnosis not present

## 2023-10-01 DIAGNOSIS — I152 Hypertension secondary to endocrine disorders: Secondary | ICD-10-CM

## 2023-10-01 DIAGNOSIS — F3342 Major depressive disorder, recurrent, in full remission: Secondary | ICD-10-CM

## 2023-10-01 DIAGNOSIS — M1711 Unilateral primary osteoarthritis, right knee: Secondary | ICD-10-CM

## 2023-10-01 DIAGNOSIS — N1831 Chronic kidney disease, stage 3a: Secondary | ICD-10-CM

## 2023-10-01 MED ORDER — SERTRALINE HCL 100 MG PO TABS: 100.0000 mg | ORAL_TABLET | Freq: Every day | ORAL | 3 refills | Status: AC
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
## 2023-10-01 NOTE — Assessment & Plan Note (Signed)
 Currently on sertraline and reports needing a prescription refill. She has been doing better on the medication but had a recent stressful event involving her son, which may have impacted her mood. Discussed the importance of continuing medication and monitoring mood. - Refill sertraline prescription

## 2023-10-06 ENCOUNTER — Ambulatory Visit: Payer: Self-pay | Admitting: Orthopedic Surgery

## 2023-10-06 NOTE — H&P (View-Only) (Signed)
 [name] [name] is an 73 y.o. female.   Chief Complaint: left knee pain HPI: Patient is here for her H&P. Patient is scheduled for a LTKR by Dr. Shelle Iron at [facility] on 10/10/23.  Dr. Shelle Iron and the patient mutually agreed to proceed with a total knee replacement. Risks and benefits of the procedure were discussed including stiffness, suboptimal range of motion, persistent pain, infection requiring removal of prosthesis and reinsertion, need for prophylactic antibiotics in the future, for example, dental procedures, possible need for manipulation, revision in the future and also anesthetic complications including DVT, PE, etc. We discussed the perioperative course, time in the hospital, postoperative recovery and the need for elevation to control swelling. We also discussed the predicted range of motion and the probability that squatting and kneeling would be unobtainable in the future. In addition, postoperative anticoagulation was discussed. We have obtained preoperative medical clearance as necessary. Provided illustrated handout and discussed it in detail. They will enroll in the total joint replacement educational forum at the hospital. She has a walker, wedge to elevate, and ice machine.  Past Medical History:  Diagnosis Date   Allergy    Anxiety    Arthritis    Basal cell carcinoma    in vaginal area   Basal cell carcinoma (BCC) in situ of skin    Cataract 2022   Just beginning   Chronic kidney disease    CMV (cytomegalovirus infection) (HCC)    Complication of anesthesia    Depression    Diabetes mellitus without complication (HCC) 2013?   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   PONV (postoperative nausea and vomiting)    Portal vein thrombosis 2015    Pre-diabetes    Shingles    VAIN I (vaginal intraepithelial neoplasia grade I)     Past Surgical History:  Procedure Laterality Date   ABDOMINAL HYSTERECTOMY  1986   BLADDER SURGERY     at the same time of hysterectomy   EXCISION HAGLUND'S DEFORMITY WITH ACHILLES TENDON REPAIR Right 10/11/2021   Procedure: Right gastroc recession, excision of Haglund deformity, Achilles tendon debridement and reconstruction;  Surgeon: Toni Arthurs, MD;  Location: [facility];  Service: Orthopedics;  Laterality: Right;   FOOT SURGERY Left 08/20/2012   Dr. Orland Jarred   HERNIA REPAIR     SKIN CANCER EXCISION     TUBAL LIGATION     WISDOM TOOTH EXTRACTION  1986    Family History  Problem Relation Age of Onset   Parkinsonism Mother    Breast cancer Mother 26   Arthritis Mother    Cancer Mother    Hearing loss Mother    Miscarriages / [place] Mother    Alcohol abuse Father    Diabetes Father    Lymphoma Son    Diabetes Son    Drug abuse Son    Obesity Son    Arthritis Brother    Hearing loss Brother    Cancer Son    Social History:  reports that she has never smoked. She has never used smokeless tobacco. She reports current alcohol use. She reports that she does not use drugs.  Allergies:  Allergies  Allergen Reactions  Lipitor [Atorvastatin] Other (See Comments)    Muscle spasms   Lovastatin     muscle aches   Statins Other (See Comments)    Muscle aches/spasms   Wheat Swelling   Zetia  [Ezetimibe]     muscle aches   Flagyl [Metronidazole] Nausea And Vomiting and Rash   Leqvio [Inclisiran Sodium] Swelling and Rash    Erythema and swelling to area of injection x2 injections (swelling/feverish/severe arm rash)    Current meds: ketoconazole 2 % topical cream sertraline  True Metrix Glucose Meter kit True Metrix Glucose Test Strip TRUEplus Lancets 30 gauge vitamin D2-vitamin K1  Review of Systems  Constitutional: Negative.   HENT: Negative.    Eyes: Negative.    Respiratory: Negative.    Cardiovascular: Negative.   Gastrointestinal: Negative.   Endocrine: Negative.   Genitourinary: Negative.   Musculoskeletal:  Positive for arthralgias, gait problem, joint swelling and myalgias.  Skin: Negative.     There were no vitals taken for this visit. Physical Exam Constitutional:      Appearance: Normal appearance.  HENT:     Head: Normocephalic and atraumatic.     Right Ear: External ear normal.     Left Ear: External ear normal.     Nose: Nose normal.     Mouth/Throat:     Pharynx: Oropharynx is clear.  Eyes:     Conjunctiva/sclera: Conjunctivae normal.  Cardiovascular:     Rate and Rhythm: Normal rate and regular rhythm.     Pulses: Normal pulses.  Pulmonary:     Effort: Pulmonary effort is normal.  Abdominal:     General: Bowel sounds are normal.  Musculoskeletal:     Cervical back: Normal range of motion.     Comments: Antalgic gait. Occasional giving way ranges 0-130 tender medial joint line patellofemoral pain compression ipsilateral hip and ankle exams unremarkable  Skin:    General: Skin is warm and dry.  Neurological:     Mental Status: She is alert.      Assessment/Plan Impression: Left knee osteoarthritis end-stage  Plan: Pt with end-stage left knee DJD, bone-on-bone, refractory to conservative tx, scheduled for left total knee replacement by Dr. Shelle Iron on 10/10/23. We again discussed the procedure itself as well as risks, complications and alternatives, including but not limited to DVT, PE, infx, bleeding, failure of procedure, need for secondary procedure including manipulation, nerve injury, ongoing pain/symptoms, anesthesia risk, even stroke or death. Also discussed typical post-op protocols, activity restrictions, need for PT, flexion/extension exercises, time out of work. Discussed need for DVT ppx post-op per protocol. Discussed dental ppx and infx prevention. Also discussed limitations post-operatively such as kneeling  and squatting. All questions were answered. Patient desires to proceed with surgery as scheduled.  Will hold supplements, ASA and NSAIDs accordingly. Will remain NPO after midnight the night before surgery. Will present to [facility] for pre-op testing. Anticipate hospital stay to include at least 2 midnights given medical history and to ensure proper pain control. Plan ASA for DVT ppx post-op. Plan Oxycodone, Colace, Miralax. Plan home with HHPT post-op with family members at home for assistance. Will follow up 10-14 days post-op for suture removal and xrays.  Plan Left total knee arthroplasty  Dorothy Spark, PA-C for Dr Shelle Iron 10/06/2023, 9:51 AM

## 2023-10-06 NOTE — H&P (Signed)
 [name] [name] is an 73 y.o. female.   Chief Complaint: left knee pain HPI: Patient is here for her H&P. Patient is scheduled for a LTKR by Dr. Shelle Iron at [facility] on 10/10/23.  Dr. Shelle Iron and the patient mutually agreed to proceed with a total knee replacement. Risks and benefits of the procedure were discussed including stiffness, suboptimal range of motion, persistent pain, infection requiring removal of prosthesis and reinsertion, need for prophylactic antibiotics in the future, for example, dental procedures, possible need for manipulation, revision in the future and also anesthetic complications including DVT, PE, etc. We discussed the perioperative course, time in the hospital, postoperative recovery and the need for elevation to control swelling. We also discussed the predicted range of motion and the probability that squatting and kneeling would be unobtainable in the future. In addition, postoperative anticoagulation was discussed. We have obtained preoperative medical clearance as necessary. Provided illustrated handout and discussed it in detail. They will enroll in the total joint replacement educational forum at the hospital. She has a walker, wedge to elevate, and ice machine.  Past Medical History:  Diagnosis Date   Allergy    Anxiety    Arthritis    Basal cell carcinoma    in vaginal area   Basal cell carcinoma (BCC) in situ of skin    Cataract 2022   Just beginning   Chronic kidney disease    CMV (cytomegalovirus infection) (HCC)    Complication of anesthesia    Depression    Diabetes mellitus without complication (HCC) 2013?   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   PONV (postoperative nausea and vomiting)    Portal vein thrombosis 2015    Pre-diabetes    Shingles    VAIN I (vaginal intraepithelial neoplasia grade I)     Past Surgical History:  Procedure Laterality Date   ABDOMINAL HYSTERECTOMY  1986   BLADDER SURGERY     at the same time of hysterectomy   EXCISION HAGLUND'S DEFORMITY WITH ACHILLES TENDON REPAIR Right 10/11/2021   Procedure: Right gastroc recession, excision of Haglund deformity, Achilles tendon debridement and reconstruction;  Surgeon: Toni Arthurs, MD;  Location: [facility];  Service: Orthopedics;  Laterality: Right;   FOOT SURGERY Left 08/20/2012   Dr. Orland Jarred   HERNIA REPAIR     SKIN CANCER EXCISION     TUBAL LIGATION     WISDOM TOOTH EXTRACTION  1986    Family History  Problem Relation Age of Onset   Parkinsonism Mother    Breast cancer Mother 26   Arthritis Mother    Cancer Mother    Hearing loss Mother    Miscarriages / [place] Mother    Alcohol abuse Father    Diabetes Father    Lymphoma Son    Diabetes Son    Drug abuse Son    Obesity Son    Arthritis Brother    Hearing loss Brother    Cancer Son    Social History:  reports that she has never smoked. She has never used smokeless tobacco. She reports current alcohol use. She reports that she does not use drugs.  Allergies:  Allergies  Allergen Reactions  Lipitor [Atorvastatin] Other (See Comments)    Muscle spasms   Lovastatin     muscle aches   Statins Other (See Comments)    Muscle aches/spasms   Wheat Swelling   Zetia  [Ezetimibe]     muscle aches   Flagyl [Metronidazole] Nausea And Vomiting and Rash   Leqvio [Inclisiran Sodium] Swelling and Rash    Erythema and swelling to area of injection x2 injections (swelling/feverish/severe arm rash)    Current meds: ketoconazole 2 % topical cream sertraline  True Metrix Glucose Meter kit True Metrix Glucose Test Strip TRUEplus Lancets 30 gauge vitamin D2-vitamin K1  Review of Systems  Constitutional: Negative.   HENT: Negative.    Eyes: Negative.    Respiratory: Negative.    Cardiovascular: Negative.   Gastrointestinal: Negative.   Endocrine: Negative.   Genitourinary: Negative.   Musculoskeletal:  Positive for arthralgias, gait problem, joint swelling and myalgias.  Skin: Negative.     There were no vitals taken for this visit. Physical Exam Constitutional:      Appearance: Normal appearance.  HENT:     Head: Normocephalic and atraumatic.     Right Ear: External ear normal.     Left Ear: External ear normal.     Nose: Nose normal.     Mouth/Throat:     Pharynx: Oropharynx is clear.  Eyes:     Conjunctiva/sclera: Conjunctivae normal.  Cardiovascular:     Rate and Rhythm: Normal rate and regular rhythm.     Pulses: Normal pulses.  Pulmonary:     Effort: Pulmonary effort is normal.  Abdominal:     General: Bowel sounds are normal.  Musculoskeletal:     Cervical back: Normal range of motion.     Comments: Antalgic gait. Occasional giving way ranges 0-130 tender medial joint line patellofemoral pain compression ipsilateral hip and ankle exams unremarkable  Skin:    General: Skin is warm and dry.  Neurological:     Mental Status: She is alert.      Assessment/Plan Impression: Left knee osteoarthritis end-stage  Plan: Pt with end-stage left knee DJD, bone-on-bone, refractory to conservative tx, scheduled for left total knee replacement by Dr. Shelle Iron on 10/10/23. We again discussed the procedure itself as well as risks, complications and alternatives, including but not limited to DVT, PE, infx, bleeding, failure of procedure, need for secondary procedure including manipulation, nerve injury, ongoing pain/symptoms, anesthesia risk, even stroke or death. Also discussed typical post-op protocols, activity restrictions, need for PT, flexion/extension exercises, time out of work. Discussed need for DVT ppx post-op per protocol. Discussed dental ppx and infx prevention. Also discussed limitations post-operatively such as kneeling  and squatting. All questions were answered. Patient desires to proceed with surgery as scheduled.  Will hold supplements, ASA and NSAIDs accordingly. Will remain NPO after midnight the night before surgery. Will present to [facility] for pre-op testing. Anticipate hospital stay to include at least 2 midnights given medical history and to ensure proper pain control. Plan ASA for DVT ppx post-op. Plan Oxycodone, Colace, Miralax. Plan home with HHPT post-op with family members at home for assistance. Will follow up 10-14 days post-op for suture removal and xrays.  Plan Left total knee arthroplasty  Dorothy Spark, PA-C for Dr Shelle Iron 10/06/2023, 9:51 AM

## 2023-10-09 DIAGNOSIS — M1711 Unilateral primary osteoarthritis, right knee: Secondary | ICD-10-CM | POA: Insufficient documentation

## 2023-10-09 NOTE — Assessment & Plan Note (Signed)
 Hereditary form of hyperlipidemia and had an adverse reaction to Leqvio, statins and ezetimibe. She is not currently on any cholesterol-lowering medication. Discussed the need to explore alternative lipid-lowering therapies with a different cardiologist post-surgery. - Discuss alternative lipid-lowering therapies with a different cardiologist post-surgery

## 2023-10-09 NOTE — Assessment & Plan Note (Signed)
 Blood pressure readings have been elevated, likely due to stress and recent life events. The target is to keep systolic BP under 130 mmHg due to her diabetes. Discussed the importance of monitoring BP and potential need for antihypertensive therapy if BP remains elevated post-surgery. - Monitor blood pressure post-surgery - Consider antihypertensive therapy if BP remains elevated

## 2023-10-09 NOTE — Assessment & Plan Note (Signed)
 Scheduled for knee surgery on October 10, 2023. Experiencing significant pain and mobility issues. Discussed anticipated outcomes, including improved mobility and pain relief. Surgical team advised her to discontinue vitamin D and other supplements 7 days prior to surgery. - Proceed with knee surgery on October 10, 2023 - Discontinue vitamin D and other supplements 7 days prior to surgery

## 2023-10-09 NOTE — Anesthesia Preprocedure Evaluation (Addendum)
 Anesthesia Evaluation  Patient identified by MRN, date of birth, ID band Patient awake    Reviewed: Allergy & Precautions, NPO status , Patient's Chart, lab work & pertinent test results  History of Anesthesia Complications (+) PONV and history of anesthetic complications  Airway Mallampati: II  TM Distance: >3 FB Neck ROM: Full    Dental no notable dental hx. (+) Implants, Dental Advisory Given, Teeth Intact   Pulmonary    Pulmonary exam normal breath sounds clear to auscultation       Cardiovascular hypertension, Pt. on medications (-) angina (-) Past MI Normal cardiovascular exam Rhythm:Regular Rate:Normal     Neuro/Psych  PSYCHIATRIC DISORDERS Anxiety Depression       GI/Hepatic   Endo/Other  diabetes, Type 2    Renal/GU Renal diseaseLab Results      Component                Value               Date                       K                        3.7                 09/29/2023                CO2                      22                  09/29/2023                BUN                      17                  09/29/2023                CREATININE               1.13 (H)            09/29/2023                             Musculoskeletal  (+) Arthritis , Osteoarthritis,    Abdominal   Peds  Hematology Lab Results      Component                Value               Date                      WBC                      7.5                 09/29/2023                HGB                      14.5                09/29/2023  HCT                      44.6                09/29/2023                MCV                      89.0                09/29/2023                PLT                      225                 09/29/2023              Anesthesia Other Findings All: see list  Reproductive/Obstetrics                             Anesthesia Physical Anesthesia Plan  ASA: 2  Anesthesia Plan:  Spinal and Regional   Post-op Pain Management: Regional block* and Minimal or no pain anticipated   Induction:   PONV Risk Score and Plan: 3 and Treatment may vary due to age or medical condition, Ondansetron and Propofol infusion  Airway Management Planned: Natural Airway and Nasal Cannula  Additional Equipment: None  Intra-op Plan:   Post-operative Plan:   Informed Consent: I have reviewed the patients History and Physical, chart, labs and discussed the procedure including the risks, benefits and alternatives for the proposed anesthesia with the patient or authorized representative who has indicated his/her understanding and acceptance.     Dental advisory given  Plan Discussed with: CRNA and Surgeon  Anesthesia Plan Comments: (Spinal w L adductor)       Anesthesia Quick Evaluation

## 2023-10-09 NOTE — Assessment & Plan Note (Signed)
 A1c has increased, likely due to stress and dietary habits. She acknowledges stress eating and plans to improve her diet and increase water intake. Discussed the importance of maintaining a healthy diet and regular physical activity post-surgery. - Encourage dietary modifications and increased water intake - Monitor A1c levels post-surgery

## 2023-10-10 ENCOUNTER — Encounter (HOSPITAL_COMMUNITY): Payer: Self-pay | Admitting: Specialist

## 2023-10-10 ENCOUNTER — Ambulatory Visit (HOSPITAL_COMMUNITY)

## 2023-10-10 ENCOUNTER — Ambulatory Visit (HOSPITAL_COMMUNITY)
Admission: RE | Admit: 2023-10-10 | Discharge: 2023-10-11 | Disposition: A | Discharge status: Home / Self Care | Payer: Medicare PPO | Source: Ambulatory Visit | Attending: Specialist | Admitting: Specialist

## 2023-10-10 ENCOUNTER — Ambulatory Visit (HOSPITAL_COMMUNITY): Admitting: Anesthesiology

## 2023-10-10 ENCOUNTER — Encounter (HOSPITAL_COMMUNITY)
Admission: RE | Disposition: A | Discharge status: Home / Self Care | Payer: Self-pay | Source: Ambulatory Visit | Attending: Specialist

## 2023-10-10 ENCOUNTER — Other Ambulatory Visit: Payer: Self-pay

## 2023-10-10 DIAGNOSIS — M1712 Unilateral primary osteoarthritis, left knee: Secondary | ICD-10-CM

## 2023-10-10 DIAGNOSIS — E785 Hyperlipidemia, unspecified: Secondary | ICD-10-CM | POA: Insufficient documentation

## 2023-10-10 DIAGNOSIS — E1122 Type 2 diabetes mellitus with diabetic chronic kidney disease: Secondary | ICD-10-CM | POA: Diagnosis not present

## 2023-10-10 DIAGNOSIS — F419 Anxiety disorder, unspecified: Secondary | ICD-10-CM | POA: Diagnosis not present

## 2023-10-10 DIAGNOSIS — F32A Depression, unspecified: Secondary | ICD-10-CM | POA: Insufficient documentation

## 2023-10-10 DIAGNOSIS — Z79899 Other long term (current) drug therapy: Secondary | ICD-10-CM | POA: Diagnosis not present

## 2023-10-10 DIAGNOSIS — I129 Hypertensive chronic kidney disease with stage 1 through stage 4 chronic kidney disease, or unspecified chronic kidney disease: Secondary | ICD-10-CM | POA: Insufficient documentation

## 2023-10-10 DIAGNOSIS — N183 Chronic kidney disease, stage 3 unspecified: Secondary | ICD-10-CM | POA: Diagnosis not present

## 2023-10-10 DIAGNOSIS — N1831 Chronic kidney disease, stage 3a: Secondary | ICD-10-CM | POA: Diagnosis not present

## 2023-10-10 DIAGNOSIS — M21162 Varus deformity, not elsewhere classified, left knee: Secondary | ICD-10-CM | POA: Diagnosis not present

## 2023-10-10 DIAGNOSIS — R2689 Other abnormalities of gait and mobility: Secondary | ICD-10-CM | POA: Insufficient documentation

## 2023-10-10 DIAGNOSIS — Z7984 Long term (current) use of oral hypoglycemic drugs: Secondary | ICD-10-CM | POA: Diagnosis not present

## 2023-10-10 DIAGNOSIS — E119 Type 2 diabetes mellitus without complications: Secondary | ICD-10-CM

## 2023-10-10 HISTORY — PX: TOTAL KNEE ARTHROPLASTY: SHX125

## 2023-10-10 LAB — GLUCOSE, CAPILLARY
Glucose-Capillary: 140 mg/dL — ABNORMAL HIGH (ref 70–99)
Glucose-Capillary: 202 mg/dL — ABNORMAL HIGH (ref 70–99)
Glucose-Capillary: 289 mg/dL — ABNORMAL HIGH (ref 70–99)

## 2023-10-10 SURGERY — ARTHROPLASTY, KNEE, TOTAL
Anesthesia: Regional | Site: Knee | Laterality: Left

## 2023-10-10 MED ORDER — BUPIVACAINE-EPINEPHRINE 0.25% -1:200000 IJ SOLN
INTRAMUSCULAR | Status: DC | PRN
  Administered 2023-10-10: 30 mL

## 2023-10-10 MED ORDER — INSULIN ASPART 100 UNIT/ML IJ SOLN
0.0000 [IU] | Freq: Every day | INTRAMUSCULAR | Status: DC
  Administered 2023-10-10: 3 [IU] via SUBCUTANEOUS

## 2023-10-10 MED ORDER — OXYCODONE HCL 5 MG PO TABS
10.0000 mg | ORAL_TABLET | ORAL | Status: DC | PRN
  Administered 2023-10-11: 10 mg via ORAL
  Filled 2023-10-10: qty 2

## 2023-10-10 MED ORDER — ACETAMINOPHEN 10 MG/ML IV SOLN
1000.0000 mg | INTRAVENOUS | Status: AC
  Administered 2023-10-10: 1000 mg via INTRAVENOUS
  Filled 2023-10-10: qty 100

## 2023-10-10 MED ORDER — OXYCODONE HCL 5 MG/5ML PO SOLN: 5.0000 mg | Freq: Once | ORAL | Status: DC | PRN

## 2023-10-10 MED ORDER — ACETAMINOPHEN 325 MG PO TABS: 325.0000 mg | ORAL_TABLET | Freq: Four times a day (QID) | ORAL | Status: DC | PRN

## 2023-10-10 MED ORDER — PHENYLEPHRINE HCL (PRESSORS) 10 MG/ML IV SOLN
INTRAVENOUS | Status: AC
  Filled 2023-10-10: qty 1

## 2023-10-10 MED ORDER — METHOCARBAMOL 1000 MG/10ML IJ SOLN: 500.0000 mg | Freq: Four times a day (QID) | INTRAMUSCULAR | Status: DC | PRN

## 2023-10-10 MED ORDER — FENTANYL CITRATE PF 50 MCG/ML IJ SOSY: 100.0000 ug | PREFILLED_SYRINGE | INTRAMUSCULAR | Status: DC

## 2023-10-10 MED ORDER — METOCLOPRAMIDE HCL 5 MG/ML IJ SOLN: 5.0000 mg | Freq: Three times a day (TID) | INTRAMUSCULAR | Status: DC | PRN

## 2023-10-10 MED ORDER — STERILE WATER FOR IRRIGATION IR SOLN
Status: DC | PRN
  Administered 2023-10-10: 1000 mL

## 2023-10-10 MED ORDER — HYDROMORPHONE HCL 1 MG/ML IJ SOLN: 0.2500 mg | INTRAMUSCULAR | Status: DC | PRN

## 2023-10-10 MED ORDER — LACTATED RINGERS IV SOLN
INTRAVENOUS | Status: DC | PRN
Start: 2023-10-10 — End: 2023-10-10

## 2023-10-10 MED ORDER — ALUM & MAG HYDROXIDE-SIMETH 200-200-20 MG/5ML PO SUSP: 30.0000 mL | ORAL | Status: DC | PRN

## 2023-10-10 MED ORDER — HYDROMORPHONE HCL 1 MG/ML IJ SOLN
0.5000 mg | INTRAMUSCULAR | Status: DC | PRN
  Administered 2023-10-10: 1 mg via INTRAVENOUS
  Filled 2023-10-10: qty 1

## 2023-10-10 MED ORDER — ORAL CARE MOUTH RINSE: 15.0000 mL | Freq: Once | OROMUCOSAL | Status: AC

## 2023-10-10 MED ORDER — ACETAMINOPHEN 500 MG PO TABS
1000.0000 mg | ORAL_TABLET | Freq: Four times a day (QID) | ORAL | Status: DC
  Administered 2023-10-10 – 2023-10-11 (×3): 1000 mg via ORAL
  Filled 2023-10-10 (×5): qty 2

## 2023-10-10 MED ORDER — CHOLECALCIFEROL 10 MCG (400 UNIT) PO TABS
10.0000 ug | ORAL_TABLET | Freq: Every day | ORAL | Status: DC
  Administered 2023-10-11: 10 ug via ORAL
  Filled 2023-10-10: qty 1

## 2023-10-10 MED ORDER — METOCLOPRAMIDE HCL 5 MG PO TABS: 5.0000 mg | ORAL_TABLET | Freq: Three times a day (TID) | ORAL | Status: DC | PRN

## 2023-10-10 MED ORDER — MENTHOL 3 MG MT LOZG: 1.0000 | LOZENGE | OROMUCOSAL | Status: DC | PRN

## 2023-10-10 MED ORDER — SERTRALINE HCL 25 MG PO TABS
25.0000 mg | ORAL_TABLET | Freq: Every day | ORAL | Status: DC
Start: 2023-10-10 — End: 2023-10-10

## 2023-10-10 MED ORDER — PROPOFOL 500 MG/50ML IV EMUL
INTRAVENOUS | Status: AC
  Filled 2023-10-10: qty 50

## 2023-10-10 MED ORDER — MIDAZOLAM HCL 2 MG/2ML IJ SOLN
2.0000 mg | INTRAMUSCULAR | Status: DC
  Administered 2023-10-10: 1 mg via INTRAVENOUS

## 2023-10-10 MED ORDER — LACTATED RINGERS IV SOLN: INTRAVENOUS | Status: DC

## 2023-10-10 MED ORDER — TRANEXAMIC ACID-NACL 1000-0.7 MG/100ML-% IV SOLN
1000.0000 mg | INTRAVENOUS | Status: AC
  Administered 2023-10-10: 1000 mg via INTRAVENOUS
  Filled 2023-10-10: qty 100

## 2023-10-10 MED ORDER — POLYETHYLENE GLYCOL 3350 17 G PO PACK: 17.0000 g | PACK | Freq: Every day | ORAL | Status: DC | PRN

## 2023-10-10 MED ORDER — DIPHENHYDRAMINE HCL 12.5 MG/5ML PO ELIX: 12.5000 mg | ORAL_SOLUTION | ORAL | Status: DC | PRN

## 2023-10-10 MED ORDER — CHLORHEXIDINE GLUCONATE 0.12 % MT SOLN
15.0000 mL | Freq: Once | OROMUCOSAL | Status: AC
  Administered 2023-10-10: 15 mL via OROMUCOSAL

## 2023-10-10 MED ORDER — ONDANSETRON HCL 4 MG/2ML IJ SOLN: 4.0000 mg | Freq: Four times a day (QID) | INTRAMUSCULAR | Status: DC | PRN

## 2023-10-10 MED ORDER — METHOCARBAMOL 500 MG PO TABS
500.0000 mg | ORAL_TABLET | Freq: Four times a day (QID) | ORAL | Status: DC | PRN
  Administered 2023-10-11: 500 mg via ORAL
  Filled 2023-10-10: qty 1

## 2023-10-10 MED ORDER — DOCUSATE SODIUM 100 MG PO CAPS
100.0000 mg | ORAL_CAPSULE | Freq: Two times a day (BID) | ORAL | Status: DC
  Administered 2023-10-10 – 2023-10-11 (×2): 100 mg via ORAL
  Filled 2023-10-10 (×2): qty 1

## 2023-10-10 MED ORDER — PHENOL 1.4 % MT LIQD: 1.0000 | OROMUCOSAL | Status: DC | PRN

## 2023-10-10 MED ORDER — PROPOFOL 500 MG/50ML IV EMUL
INTRAVENOUS | Status: DC | PRN
  Administered 2023-10-10: 100 ug/kg/min via INTRAVENOUS

## 2023-10-10 MED ORDER — OXYCODONE HCL 5 MG PO TABS
5.0000 mg | ORAL_TABLET | ORAL | Status: DC | PRN
  Administered 2023-10-10 – 2023-10-11 (×4): 5 mg via ORAL
  Filled 2023-10-10 (×4): qty 1

## 2023-10-10 MED ORDER — SODIUM CHLORIDE 0.9% FLUSH
INTRAVENOUS | Status: DC | PRN
  Administered 2023-10-10: 40 mL

## 2023-10-10 MED ORDER — ASPIRIN 81 MG PO CHEW
81.0000 mg | CHEWABLE_TABLET | Freq: Two times a day (BID) | ORAL | Status: DC
  Administered 2023-10-11: 81 mg via ORAL
  Filled 2023-10-10: qty 1

## 2023-10-10 MED ORDER — CLONIDINE HCL (ANALGESIA) 100 MCG/ML EP SOLN
EPIDURAL | Status: DC | PRN
  Administered 2023-10-10: 100 ug

## 2023-10-10 MED ORDER — FENTANYL CITRATE (PF) 100 MCG/2ML IJ SOLN
INTRAMUSCULAR | Status: AC
  Filled 2023-10-10: qty 2

## 2023-10-10 MED ORDER — ONDANSETRON HCL 4 MG/2ML IJ SOLN: 4.0000 mg | Freq: Once | INTRAMUSCULAR | Status: DC | PRN

## 2023-10-10 MED ORDER — ONDANSETRON HCL 4 MG/2ML IJ SOLN
INTRAMUSCULAR | Status: AC
  Filled 2023-10-10: qty 2

## 2023-10-10 MED ORDER — AMISULPRIDE (ANTIEMETIC) 5 MG/2ML IV SOLN: 10.0000 mg | Freq: Once | INTRAVENOUS | Status: DC | PRN

## 2023-10-10 MED ORDER — SODIUM CHLORIDE 0.9 % IR SOLN
Status: DC | PRN
  Administered 2023-10-10: 3250 mL

## 2023-10-10 MED ORDER — SERTRALINE HCL 100 MG PO TABS: 100.0000 mg | ORAL_TABLET | Freq: Every day | ORAL | Status: DC

## 2023-10-10 MED ORDER — ACETAMINOPHEN 10 MG/ML IV SOLN: 1000.0000 mg | Freq: Once | INTRAVENOUS | Status: DC | PRN

## 2023-10-10 MED ORDER — ROPIVACAINE HCL 5 MG/ML IJ SOLN
INTRAMUSCULAR | Status: DC | PRN
  Administered 2023-10-10: 30 mL via PERINEURAL

## 2023-10-10 MED ORDER — BUPIVACAINE LIPOSOME 1.3 % IJ SUSP
INTRAMUSCULAR | Status: DC | PRN
  Administered 2023-10-10: 20 mL

## 2023-10-10 MED ORDER — CEFAZOLIN SODIUM-DEXTROSE 2-4 GM/100ML-% IV SOLN
2.0000 g | Freq: Four times a day (QID) | INTRAVENOUS | Status: AC
  Administered 2023-10-10 – 2023-10-11 (×2): 2 g via INTRAVENOUS
  Filled 2023-10-10 (×2): qty 100

## 2023-10-10 MED ORDER — BUPIVACAINE LIPOSOME 1.3 % IJ SUSP
INTRAMUSCULAR | Status: AC
  Filled 2023-10-10: qty 20

## 2023-10-10 MED ORDER — CEFAZOLIN SODIUM-DEXTROSE 2-4 GM/100ML-% IV SOLN
2.0000 g | INTRAVENOUS | Status: AC
  Administered 2023-10-10: 2 g via INTRAVENOUS
  Filled 2023-10-10: qty 100

## 2023-10-10 MED ORDER — MAGNESIUM CITRATE PO SOLN: 1.0000 | Freq: Once | ORAL | Status: DC | PRN

## 2023-10-10 MED ORDER — SERTRALINE HCL 25 MG PO TABS
125.0000 mg | ORAL_TABLET | Freq: Every day | ORAL | Status: DC
  Administered 2023-10-10: 125 mg via ORAL
  Filled 2023-10-10: qty 1

## 2023-10-10 MED ORDER — BISACODYL 5 MG PO TBEC: 5.0000 mg | DELAYED_RELEASE_TABLET | Freq: Every day | ORAL | Status: DC | PRN

## 2023-10-10 MED ORDER — DEXAMETHASONE SODIUM PHOSPHATE 10 MG/ML IJ SOLN
INTRAMUSCULAR | Status: AC
  Filled 2023-10-10: qty 1

## 2023-10-10 MED ORDER — OXYCODONE HCL 5 MG PO TABS: 5.0000 mg | ORAL_TABLET | Freq: Once | ORAL | Status: DC | PRN

## 2023-10-10 MED ORDER — 0.9 % SODIUM CHLORIDE (POUR BTL) OPTIME
TOPICAL | Status: DC | PRN
  Administered 2023-10-10: 1000 mL

## 2023-10-10 MED ORDER — PHENYLEPHRINE HCL-NACL 20-0.9 MG/250ML-% IV SOLN
INTRAVENOUS | Status: DC | PRN
  Administered 2023-10-10: 25 ug/min via INTRAVENOUS

## 2023-10-10 MED ORDER — INSULIN ASPART 100 UNIT/ML IJ SOLN
0.0000 [IU] | Freq: Three times a day (TID) | INTRAMUSCULAR | Status: DC
  Administered 2023-10-11: 3 [IU] via SUBCUTANEOUS
  Administered 2023-10-11: 2 [IU] via SUBCUTANEOUS

## 2023-10-10 MED ORDER — POTASSIUM CHLORIDE IN NACL 20-0.9 MEQ/L-% IV SOLN
INTRAVENOUS | Status: DC
Start: 2023-10-10 — End: 2023-10-11
  Filled 2023-10-10: qty 1000

## 2023-10-10 MED ORDER — ONDANSETRON HCL 4 MG PO TABS: 4.0000 mg | ORAL_TABLET | Freq: Four times a day (QID) | ORAL | Status: DC | PRN

## 2023-10-10 MED ORDER — MIDAZOLAM HCL 2 MG/2ML IJ SOLN
INTRAMUSCULAR | Status: AC
  Filled 2023-10-10: qty 2

## 2023-10-10 MED ORDER — FENTANYL CITRATE (PF) 100 MCG/2ML IJ SOLN
INTRAMUSCULAR | Status: DC | PRN
  Administered 2023-10-10: 50 ug via INTRAVENOUS

## 2023-10-10 MED ORDER — RISAQUAD PO CAPS
1.0000 | ORAL_CAPSULE | Freq: Every day | ORAL | Status: DC
  Administered 2023-10-10 – 2023-10-11 (×2): 1 via ORAL
  Filled 2023-10-10 (×2): qty 1

## 2023-10-10 SURGICAL SUPPLY — 63 items
ATTUNE MED DOME PAT 32 KNEE (Knees) IMPLANT
ATTUNE PSFEM LTSZ6 NARCEM KNEE (Femur) IMPLANT
ATTUNE PSRP INSR SZ6 6 KNEE (Insert) IMPLANT
BAG COUNTER SPONGE SURGICOUNT (BAG) IMPLANT
BAG DECANTER FOR FLEXI CONT (MISCELLANEOUS) ×1 IMPLANT
BAG ZIPLOCK 12X15 (MISCELLANEOUS) IMPLANT
BASE TIBIAL ROT PLAT SZ 5 KNEE (Knees) IMPLANT
BLADE SAW SGTL 11.0X1.19X90.0M (BLADE) ×1 IMPLANT
BLADE SAW SGTL 13.0X1.19X90.0M (BLADE) ×1 IMPLANT
BLADE SURG SZ10 CARB STEEL (BLADE) ×2 IMPLANT
BNDG ELASTIC 4INX 5YD STR LF (GAUZE/BANDAGES/DRESSINGS) ×1 IMPLANT
BNDG ELASTIC 6INX 5YD STR LF (GAUZE/BANDAGES/DRESSINGS) ×1 IMPLANT
BOWL SMART MIX CTS (DISPOSABLE) ×1 IMPLANT
CEMENT HV SMART SET (Cement) ×2 IMPLANT
COVER SURGICAL LIGHT HANDLE (MISCELLANEOUS) ×1 IMPLANT
CUFF TRNQT CYL 34X4.125X (TOURNIQUET CUFF) ×1 IMPLANT
DRAPE INCISE IOBAN 66X45 STRL (DRAPES) IMPLANT
DRAPE SHEET LG 3/4 BI-LAMINATE (DRAPES) ×1 IMPLANT
DRAPE SURG ORHT 6 SPLT 77X108 (DRAPES) ×2 IMPLANT
DRAPE TOP 10253 STERILE (DRAPES) ×1 IMPLANT
DRAPE U-SHAPE 47X51 STRL (DRAPES) ×1 IMPLANT
DRSG AQUACEL AG ADV 3.5X10 (GAUZE/BANDAGES/DRESSINGS) ×1 IMPLANT
DRSG TEGADERM 4X4.75 (GAUZE/BANDAGES/DRESSINGS) IMPLANT
DURAPREP 26ML APPLICATOR (WOUND CARE) ×1 IMPLANT
ELECT BLADE TIP CTD 4 INCH (ELECTRODE) ×1 IMPLANT
ELECT REM PT RETURN 15FT ADLT (MISCELLANEOUS) ×1 IMPLANT
EVACUATOR 1/8 PVC DRAIN (DRAIN) IMPLANT
GAUZE SPONGE 2X2 8PLY STRL LF (GAUZE/BANDAGES/DRESSINGS) IMPLANT
GLOVE BIO SURGEON STRL SZ7 (GLOVE) ×1 IMPLANT
GLOVE BIOGEL PI IND STRL 7.0 (GLOVE) ×1 IMPLANT
GLOVE BIOGEL PI IND STRL 8 (GLOVE) ×1 IMPLANT
GLOVE SURG SS PI 8.0 STRL IVOR (GLOVE) ×1 IMPLANT
GOWN STRL REUS W/ TWL XL LVL3 (GOWN DISPOSABLE) ×2 IMPLANT
HEMOSTAT SPONGE AVITENE ULTRA (HEMOSTASIS) IMPLANT
HOLDER FOLEY CATH W/STRAP (MISCELLANEOUS) IMPLANT
IMMOBILIZER KNEE 20 (SOFTGOODS) ×1 IMPLANT
IMMOBILIZER KNEE 20 THIGH 36 (SOFTGOODS) ×1 IMPLANT
KIT TURNOVER KIT A (KITS) IMPLANT
MANIFOLD NEPTUNE II (INSTRUMENTS) ×1 IMPLANT
NS IRRIG 1000ML POUR BTL (IV SOLUTION) IMPLANT
PACK TOTAL KNEE CUSTOM (KITS) ×1 IMPLANT
PIN STEINMAN FIXATION KNEE (PIN) IMPLANT
PROTECTOR NERVE ULNAR (MISCELLANEOUS) ×1 IMPLANT
SAW OSC TIP CART 19.5X105X1.3 (SAW) IMPLANT
SEALER BIPOLAR AQUA 6.0 (INSTRUMENTS) IMPLANT
SET HNDPC FAN SPRY TIP SCT (DISPOSABLE) ×1 IMPLANT
SOLUTION PRONTOSAN WOUND 350ML (IRRIGATION / IRRIGATOR) ×1 IMPLANT
SPIKE FLUID TRANSFER (MISCELLANEOUS) ×1 IMPLANT
STAPLER VISISTAT (STAPLE) IMPLANT
STRIP CLOSURE SKIN 1/2X4 (GAUZE/BANDAGES/DRESSINGS) IMPLANT
SUT BONE WAX W31G (SUTURE) ×1 IMPLANT
SUT MNCRL AB 4-0 PS2 18 (SUTURE) IMPLANT
SUT STRATAFIX 0 PDS 27 VIOLET (SUTURE) ×1 IMPLANT
SUT VIC AB 1 CT1 27XBRD ANTBC (SUTURE) ×3 IMPLANT
SUT VIC AB 2-0 CT1 TAPERPNT 27 (SUTURE) ×3 IMPLANT
SUTURE STRATFX 0 PDS 27 VIOLET (SUTURE) ×1 IMPLANT
SYR 3ML LL SCALE MARK (SYRINGE) IMPLANT
TIBIAL BASE ROT PLAT SZ 5 KNEE (Knees) ×1 IMPLANT
TRAY FOLEY MTR SLVR 16FR STAT (SET/KITS/TRAYS/PACK) ×1 IMPLANT
TUBE SUCTION HIGH CAP CLEAR NV (SUCTIONS) ×1 IMPLANT
WATER STERILE IRR 1000ML POUR (IV SOLUTION) ×1 IMPLANT
WIPE CHG 2% PREP (PERSONAL CARE ITEMS) ×1 IMPLANT
WRAP KNEE MAXI GEL POST OP (GAUZE/BANDAGES/DRESSINGS) ×1 IMPLANT

## 2023-10-10 NOTE — Transfer of Care (Signed)
 Immediate Anesthesia Transfer of Care Note  Patient: [name] [name]  Procedure(s) Performed: ARTHROPLASTY, KNEE, TOTAL (Left: Knee)  Patient Location: PACU  Anesthesia Type:spinal  Level of Consciousness: awake and alert   Airway & Oxygen Therapy: Patient Spontanous Breathing and Patient connected to nasal cannula oxygen  Post-op Assessment: Report given to RN and Post -op Vital signs reviewed and stable  Post vital signs: Reviewed and stable  Last Vitals:  Vitals Value Taken Time  BP 105/55 10/10/23 1523  Temp    Pulse 84 10/10/23 1527  Resp 16 10/10/23 1527  SpO2 94 % 10/10/23 1527  Vitals shown include unfiled device data.  Last Pain:  Vitals:   10/10/23 1523  TempSrc:   PainSc: Asleep         Complications: No notable events documented.

## 2023-10-10 NOTE — Anesthesia Procedure Notes (Signed)
 Anesthesia Regional Block: Adductor canal block   Pre-Anesthetic Checklist: , timeout performed,  Correct Patient, Correct Site, Correct Laterality,  Correct Procedure, Correct Position, site marked,  Risks and benefits discussed,  Surgical consent,  Pre-op evaluation,  At surgeon's request and post-op pain management  Laterality: Lower and Left  Prep: chloraprep       Needles:  Injection technique: Single-shot  Needle Type: Echogenic Needle     Needle Length: 9cm  Needle Gauge: 22     Additional Needles:   Procedures:,,,, ultrasound used (permanent image in chart),,    Narrative:  Start time: 10/10/2023 11:54 AM End time: 10/10/2023 12:01 PM Injection made incrementally with aspirations every 5 mL.  Performed by: Personally  Anesthesiologist: Trevor Iha, MD  Additional Notes: Block assessed prior to surgery. Pt tolerated procedure well.

## 2023-10-10 NOTE — Brief Op Note (Signed)
 10/10/2023  12:51 PM  PATIENT:  [name] [name]  73 y.o. female  PRE-OPERATIVE DIAGNOSIS:  Left knee osteoarthritis  POST-OPERATIVE DIAGNOSIS:  Left knee osteoarthritis  PROCEDURE:  Procedure(s): ARTHROPLASTY, KNEE, TOTAL (Left) The aquamantis was utilized for this case to help facilitate better hemostasis as patient was felt to be at increased risk of bleeding because of recent anticoagulation use.   SURGEON:  Surgeons and Role:    * Jene Every, MD - Primary  PHYSICIAN ASSISTANT:   ASSISTANTS: Bissell   ANESTHESIA:   spinal  EBL:  50    BLOOD ADMINISTERED:none  DRAINS: none   LOCAL MEDICATIONS USED:  MARCAINE     SPECIMEN:  No Specimen  DISPOSITION OF SPECIMEN:  N/A  COUNTS:  YES  TT:  DICTATION: .Other Dictation: Dictation Number [id number]  PLAN OF CARE: Admit for overnight observation  PATIENT DISPOSITION:  PACU - hemodynamically stable.   Delay start of Pharmacological VTE agent (>24hrs) due to surgical blood loss or risk of bleeding: no

## 2023-10-10 NOTE — Discharge Instructions (Signed)
 Elevate leg above heart 6x a day for each Use knee immobilizer while walking until can SLR x 10 Use knee immobilizer in bed to keep knee in extension Aquacel dressing may remain in place for one week. May shower with aquacel dressing in place. If the dressing becomes saturated or peels off, you may remove aquacel dressing. Do not remove steri-strips if they are present. Place new dressing with gauze and tape or ACE bandage which should be kept clean and dry and changed daily.  INSTRUCTIONS AFTER JOINT REPLACEMENT   Remove items at home which could result in a fall. This includes throw rugs or furniture in walking pathways ICE to the affected joint every three hours while awake for 30 minutes at a time, for at least the first 3-5 days, and then as needed for pain and swelling.  Continue to use ice for pain and swelling. You may notice swelling that will progress down to the foot and ankle.  This is normal after surgery.  Elevate your leg when you are not up walking on it.   Continue to use the breathing machine you got in the hospital (incentive spirometer) which will help keep your temperature down.  It is common for your temperature to cycle up and down following surgery, especially at night when you are not up moving around and exerting yourself.  The breathing machine keeps your lungs expanded and your temperature down.   DIET:  As you were doing prior to hospitalization, we recommend a well-balanced diet.  DRESSING / WOUND CARE / SHOWERING   You may change your dressing 7 days after surgery.  Then change the dressing every day with sterile gauze.  Please use good hand washing techniques before changing the dressing.  Do not use any lotions or creams on the incision until instructed by your surgeon.  ACTIVITY  Increase activity slowly as tolerated, but follow the weight bearing instructions below.   No driving for 6 weeks or until further direction given by your physician.  You  cannot drive while taking narcotics.  No lifting or carrying greater than 10 lbs. until further directed by your surgeon. Avoid periods of inactivity such as sitting longer than an hour when not asleep. This helps prevent blood clots.  You may return to work once you are authorized by your doctor.     WEIGHT BEARING   Weight bearing as tolerated with assist device (walker, cane, etc) as directed, use it as long as suggested by your surgeon or therapist, typically at least 4-6 weeks.   EXERCISES  Results after joint replacement surgery are often greatly improved when you follow the exercise, range of motion and muscle strengthening exercises prescribed by your doctor. Safety measures are also important to protect the joint from further injury. Any time any of these exercises cause you to have increased pain or swelling, decrease what you are doing until you are comfortable again and then slowly increase them. If you have problems or questions, call your caregiver or physical therapist for advice.   Rehabilitation is important following a joint replacement. After just a few days of immobilization, the muscles of the leg can become weakened and shrink (atrophy).  These exercises are designed to build up the tone and strength of the thigh and leg muscles and to improve motion. Often times heat used for twenty to thirty minutes before working out will loosen up your tissues and help with improving the range of motion but do not use heat  for the first two weeks following surgery (sometimes heat can increase post-operative swelling).   These exercises can be done on a training (exercise) mat, on the floor, on a table or on a bed. Use whatever works the best and is most comfortable for you.    Use music or television while you are exercising so that the exercises are a pleasant break in your day. This will make your life better with the exercises acting as a break in your routine that you can look forward  to.   Perform all exercises about fifteen times, three times per day or as directed.  You should exercise both the operative leg and the other leg as well.  Exercises include:   Quad Sets - Tighten up the muscle on the front of the thigh (Quad) and hold for 5-10 seconds.   Straight Leg Raises - With your knee straight (if you were given a brace, keep it on), lift the leg to 60 degrees, hold for 3 seconds, and slowly lower the leg.  Perform this exercise against resistance later as your leg gets stronger.  Leg Slides: Lying on your back, slowly slide your foot toward your buttocks, bending your knee up off the floor (only go as far as is comfortable). Then slowly slide your foot back down until your leg is flat on the floor again.  Angel Wings: Lying on your back spread your legs to the side as far apart as you can without causing discomfort.  Hamstring Strength:  Lying on your back, push your heel against the floor with your leg straight by tightening up the muscles of your buttocks.  Repeat, but this time bend your knee to a comfortable angle, and push your heel against the floor.  You may put a pillow under the heel to make it more comfortable if necessary.   A rehabilitation program following joint replacement surgery can speed recovery and prevent re-injury in the future due to weakened muscles. Contact your doctor or a physical therapist for more information on knee rehabilitation.    CONSTIPATION  Constipation is defined medically as fewer than three stools per week and severe constipation as less than one stool per week.  Even if you have a regular bowel pattern at home, your normal regimen is likely to be disrupted due to multiple reasons following surgery.  Combination of anesthesia, postoperative narcotics, change in appetite and fluid intake all can affect your bowels.   YOU MUST use at least one of the following options; they are listed in order of increasing strength to get the job  done.  They are all available over the counter, and you may need to use some, POSSIBLY even all of these options:    Drink plenty of fluids (prune juice may be helpful) and high fiber foods Colace 100 mg by mouth twice a day  Senokot for constipation as directed and as needed Dulcolax (bisacodyl), take with full glass of water  Miralax (polyethylene glycol) once or twice a day as needed.  If you have tried all these things and are unable to have a bowel movement in the first 3-4 days after surgery call either your surgeon or your primary doctor.    If you experience loose stools or diarrhea, hold the medications until you stool forms back up.  If your symptoms do not get better within 1 week or if they get worse, check with your doctor.  If you experience "the worst abdominal pain ever" or develop  nausea or vomiting, please contact the office immediately for further recommendations for treatment.   ITCHING:  If you experience itching with your medications, try taking only a single pain pill, or even half a pain pill at a time.  You can also use Benadryl over the counter for itching or also to help with sleep.   TED HOSE STOCKINGS:  Use stockings on both legs until for at least 2 weeks or as directed by physician office. They may be removed at night for sleeping.  MEDICATIONS:  See your medication summary on the "After Visit Summary" that nursing will review with you.  You may have some home medications which will be placed on hold until you complete the course of blood thinner medication.  It is important for you to complete the blood thinner medication as prescribed.  PRECAUTIONS:  If you experience chest pain or shortness of breath - call 911 immediately for transfer to the hospital emergency department.   If you develop a fever greater that 101 F, purulent drainage from wound, increased redness or drainage from wound, foul odor from the wound/dressing, or calf pain - CONTACT YOUR SURGEON.                                                    FOLLOW-UP APPOINTMENTS:  If you do not already have a post-op appointment, please call the office for an appointment to be seen by your surgeon.  Guidelines for how soon to be seen are listed in your "After Visit Summary", but are typically between 1-4 weeks after surgery.  OTHER INSTRUCTIONS:   Knee Replacement:  Do not place pillow under knee, focus on keeping the knee straight while resting. CPM instructions: 0-90 degrees, 2 hours in the morning, 2 hours in the afternoon, and 2 hours in the evening. Place foam block, curve side up under heel at all times except when in CPM or when walking.  DO NOT modify, tear, cut, or change the foam block in any way.  POST-OPERATIVE OPIOID TAPER INSTRUCTIONS: It is important to wean off of your opioid medication as soon as possible. If you do not need pain medication after your surgery it is ok to stop day one. Opioids include: Codeine, Hydrocodone(Norco, Vicodin), Oxycodone(Percocet, oxycontin) and hydromorphone amongst others.  Long term and even short term use of opiods can cause: Increased pain response Dependence Constipation Depression Respiratory depression And more.  Withdrawal symptoms can include Flu like symptoms Nausea, vomiting And more Techniques to manage these symptoms Hydrate well Eat regular healthy meals Stay active Use relaxation techniques(deep breathing, meditating, yoga) Do Not substitute Alcohol to help with tapering If you have been on opioids for less than two weeks and do not have pain than it is ok to stop all together.  Plan to wean off of opioids This plan should start within one week post op of your joint replacement. Maintain the same interval or time between taking each dose and first decrease the dose.  Cut the total daily intake of opioids by one tablet each day Next start to increase the time between doses. The last dose that should be eliminated is the evening  dose.   MAKE SURE YOU:  Understand these instructions.  Get help right away if you are not doing well or get worse.    Thank you for  letting us be a part of your medical care team.  It is a privilege we respect greatly.  We hope these instructions will help you stay on track for a fast and full recovery!

## 2023-10-10 NOTE — Plan of Care (Signed)
  Problem: Education: Goal: Knowledge of General Education information will improve Description: Including pain rating scale, medication(s)/side effects and non-pharmacologic comfort measures Outcome: Progressing   Problem: Activity: Goal: Risk for activity intolerance will decrease Outcome: Progressing   Problem: Nutrition: Goal: Adequate nutrition will be maintained Outcome: Progressing   Problem: Elimination: Goal: Will not experience complications related to bowel motility Outcome: Progressing   Problem: Pain Managment: Goal: General experience of comfort will improve and/or be controlled Outcome: Progressing   Problem: Education: Goal: Knowledge of the prescribed therapeutic regimen will improve Outcome: Progressing   Problem: Activity: Goal: Ability to avoid complications of mobility impairment will improve Outcome: Progressing   Problem: Pain Management: Goal: Pain level will decrease with appropriate interventions Outcome: Progressing

## 2023-10-10 NOTE — Interval H&P Note (Signed)
 History and Physical Interval Note:  10/10/2023 12:51 PM  [name] [name]  has presented today for surgery, with the diagnosis of Left knee osteoarthritis.  The various methods of treatment have been discussed with the patient and family. After consideration of risks, benefits and other options for treatment, the patient has consented to  Procedure(s): ARTHROPLASTY, KNEE, TOTAL (Left) as a surgical intervention.  The patient's history has been reviewed, patient examined, no change in status, stable for surgery.  I have reviewed the patient's chart and labs.  Questions were answered to the patient's satisfaction.     Javier Docker

## 2023-10-10 NOTE — Progress Notes (Signed)
 Attempted report

## 2023-10-10 NOTE — Op Note (Signed)
 NAME: [name] [name], [name] [name] MEDICAL RECORD NO: [id number] ACCOUNT NO: [id number] DATE OF BIRTH: May 20, 1951 FACILITY: [facility] LOCATION: [id number] PHYSICIAN: Javier Docker, MD  Operative Report   DATE OF PROCEDURE: 10/10/2023  PREOPERATIVE DIAGNOSIS: End-stage osteoarthrosis, varus deformity of the left knee.  POSTOPERATIVE DIAGNOSIS: End-stage osteoarthrosis, varus deformity of the left knee.  PROCEDURE PERFORMED: Left total knee arthroplasty utilizing the Depuy Attune rotating platform, 6 femur, 5 tibia, 6 mm insert, 32 patella.  ANESTHESIA: Spinal.  ASSISTANT: Lanna Poche, PA.  HISTORY: This is a 73 year old with end-stage osteoporosis, varus deformity, bone-on-bone, failing conservative treatment, indicated for replacement of degenerated joint.  Risks and benefits were discussed including bleeding, infection, damage to neurovascular  structures, no change in symptoms or symptoms of DVT, PE, and anesthetic complications, etc.  DESCRIPTION OF PROCEDURE:  With the patient in supine position after induction of adequate spinal anesthesia, 2 g of Kefzol, the left lower extremity was prepped and draped in exsanguinated in the usual sterile fashion.  Thigh tourniquet inflated to 225  mmHg.  A midline incision was then made over the knee.  Full-thickness flaps were developed.  Median parapatellar arthrotomy was performed.  Elevated soft tissues medially preserving the MCL.  Patella gently everted and knee flexed.  Bone-on-bone  arthrosis of the medial compartment and patellofemoral joint was noted.  Remnants of the medial and lateral menisci were removed.  Osteophytes were removed with a rongeur.  A notch was placed above the femoral notch for the starting hole for a femoral  drill, which was drilled.  This was entered the canal.  Irrigated, we used an intramedullary guide, 5-degree left, nine off the distal femur.  This was then pinned.  I performed a distal femoral cut.  I then subluxed the  tibia.  It was bone on bone  medially.  The low side was medial.  Using external alignment guide, through up the defect, which was medially, bisecting the tibiotalar joint parallel to the shaft, 3-degree slope, pinned, performed the proximal tibial cut.  We then tested our extension  gap and it was satisfactory and symmetric.  Reflexed the knee, measured the femur.  3 degrees of external rotation pinned to a 6.  Off the anterior cortex, this was pinned.  I then placed our distal femoral block.  I then performed an anterior,  posterior, and chamfer cuts, posterior soft tissue protected at all times.  I then subluxed the tibia once again, sized it to a 5, maximizing coverage just to the medial aspect of the tibial tubercle.  This was pinned, harvested bone centrally, and  impacted in the distal femur.  Drilled centrally and placed our fin guide.  Turned attention back to the femur.  Box cut was then performed after bisecting the canal with a jig pin, performed a box cut.  I then placed a trial femur and fit flush.   Drilled the lug holes.  Harvested bone from the tibia, which was then impacted into the distal femur.  I used a 5-mm insert, reduced the knee and had full extension, full flexion, good stability with varus and valgus stressing at 0 to 30 degrees.   Negative anterior drawer.  I then everted the patella, measured it to a 22, planned it to a 15.  This was using the patellar jig and then sized it to a 32 medializing it.  The paddle parallel to the joint surface, drilled our peg holes.  Placed a trial of patella and reduced it  and had an  excellent patellofemoral tracking.  All trials were removed.  Checked posteriorly and removed any remnants of the medial and lateral menisci.  The capsule and the popliteus was intact.  I placed Exparel through the posterior medial capsule.   Aspirating without a break in a vacuum and injecting 10 mL.  Then anesthetized the medial and lateral menisci, periosteum of  the femur, proximal tibia, quadriceps, etc.  Then used pulsatile lavage to thoroughly clean all surfaces.  Knee was flexed all  surfaces and thoroughly dried.  Cement was mixed on the back table under centrifuge vacuum.  Cement was placed in the proximal tibia.  I then placed cement on the tibial tray, impacted it into place, resonant cement removed.  I cemented and impacted the  femoral component with cement on both the femur and the femoral component.  I placed a 5 insert, reduced the knee and held an axial load throughout the curing of the cement.  I then cemented and clamped the patella.  Marcaine with epinephrine was placed  in the back of the wound and then Prontosan and then we covered the wound throughout with curing of the cement at 55 minutes.  The tourniquet was deflated and any bleeding was cauterized.  This was done with the Aquamantys because the patient had been on  aspirin quite recently.  It helped [place] to achieving strict hemostasis. We had flexion and extension and a slight anterior drawer in flexion.  I then removed the 5 insert, meticulously removed all redundant cement.  I then tried a 6 insert and that was  better fitting.  This was then removed, copiously irrigated with pulsatile lavage followed by Prontosan, placed a 6 permanent polyethylene insert, reduced the knee, had full extension, full flexion, good stability to varus and valgus stress at 0 to 30  degrees.  Negative anterior drawer.  Following this, copiously irrigated once again in mid flexion.  We reapproximated the patellar arthrotomy with 1 Vicryl interrupted figure-of-eight sutures.  Oversewn with a running Stratafix.  Lateral release was  then performed, just slight tightness noted.   Following this, we had excellent patellofemoral tracking.  Good stability to varus and valgus stress at 0 and 30 degrees.  Negative anterior drawer.  Next, we closed the subcu with 2-0 Vicryl.  Skin with subcuticular Prolene.  Sterile  dressing applied.   She had excellent patellofemoral tracking and flexion to gravity at 90 degrees.  The patient was placed in an immobilizer, extubated without difficulty, and transported to the recovery room in satisfactory condition.  The patient tolerated the procedure well with no complications.  Assistant, Orland Penman, PA was used throughout the case for patient positioning, gentle traction, and closure.  TOURNIQUET TIME: 55 minutes.  BLOOD LOSS: 75 mL.    MUK D: 10/10/2023 3:11:23 pm T: 10/10/2023 10:04:00 pm  JOB: [id number]/ [id number]

## 2023-10-10 NOTE — Care Plan (Signed)
 Ortho Bundle Case Management Note  Patient Details  Name: [name] [name] MRN: [id number] Date of Birth: [birth year]                   DME Arranged:  N/A DME Agency:      Additional Comments: Please contact me with any questions of if this plan should need to change.    Despina Pole, CCM Case Manager, [facility]  [phone] 10/10/2023, 2:06 PM

## 2023-10-10 NOTE — Anesthesia Procedure Notes (Signed)
 Spinal  Patient location during procedure: OR Start time: 10/10/2023 1:01 PM End time: 10/10/2023 1:07 PM Reason for block: surgical anesthesia Staffing Performed: anesthesiologist  Anesthesiologist: Trevor Iha, MD Performed by: Trevor Iha, MD Authorized by: Trevor Iha, MD   Preanesthetic Checklist Completed: patient identified, IV checked, risks and benefits discussed, surgical consent, monitors and equipment checked, pre-op evaluation and timeout performed Spinal Block Patient position: sitting Prep: DuraPrep and site prepped and draped Patient monitoring: heart rate, cardiac monitor, continuous pulse ox and blood pressure Approach: midline Location: L3-4 Injection technique: single-shot Needle Needle type: Pencan  Needle gauge: 24 G Needle length: 10 cm Needle insertion depth: 6 cm Assessment Sensory level: T4 Events: CSF return Additional Notes  2 Attempt (s). Pt tolerated procedure well.

## 2023-10-11 DIAGNOSIS — E1122 Type 2 diabetes mellitus with diabetic chronic kidney disease: Secondary | ICD-10-CM | POA: Diagnosis not present

## 2023-10-11 DIAGNOSIS — N183 Chronic kidney disease, stage 3 unspecified: Secondary | ICD-10-CM | POA: Diagnosis not present

## 2023-10-11 DIAGNOSIS — M1712 Unilateral primary osteoarthritis, left knee: Secondary | ICD-10-CM | POA: Diagnosis not present

## 2023-10-11 LAB — CBC
HCT: 40.3 % (ref 36.0–46.0)
Hemoglobin: 12.9 g/dL (ref 12.0–15.0)
MCH: 29.2 pg (ref 26.0–34.0)
MCHC: 32 g/dL (ref 30.0–36.0)
MCV: 91.2 fL (ref 80.0–100.0)
Platelets: 186 10*3/uL (ref 150–400)
RBC: 4.42 MIL/uL (ref 3.87–5.11)
RDW: 13.9 % (ref 11.5–15.5)
WBC: 12.8 10*3/uL — ABNORMAL HIGH (ref 4.0–10.5)
nRBC: 0 % (ref 0.0–0.2)

## 2023-10-11 LAB — BASIC METABOLIC PANEL
Anion gap: 8 (ref 5–15)
BUN: 16 mg/dL (ref 8–23)
CO2: 23 mmol/L (ref 22–32)
Calcium: 8.4 mg/dL — ABNORMAL LOW (ref 8.9–10.3)
Chloride: 106 mmol/L (ref 98–111)
Creatinine, Ser: 1.26 mg/dL — ABNORMAL HIGH (ref 0.44–1.00)
GFR, Estimated: 45 mL/min — ABNORMAL LOW (ref >60)
Glucose, Bld: 186 mg/dL — ABNORMAL HIGH (ref 70–99)
Potassium: 4.6 mmol/L (ref 3.5–5.1)
Sodium: 137 mmol/L (ref 135–145)

## 2023-10-11 LAB — GLUCOSE, CAPILLARY
Glucose-Capillary: 170 mg/dL — ABNORMAL HIGH (ref 70–99)
Glucose-Capillary: 121 mg/dL — ABNORMAL HIGH (ref 70–99)

## 2023-10-11 MED ORDER — METFORMIN HCL 500 MG PO TABS: ORAL_TABLET | ORAL | 0 refills | Status: DC
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
## 2023-10-11 NOTE — Consult Note (Signed)
 Initial Consultation Note   Patient: [name] [name] ZOX:[id number] DOB: 21-Aug-1950 PCP: Sherlyn Hay, DO DOA: 10/10/2023 DOS: the patient was seen and examined on 10/11/2023 Primary service: Jene Every, MD, orthopedic surgery  Referring physician: Jene Every, MD  Reason for consult: Diabetes management  Assessment/Plan:  Diabetes mellitus type 2 Well controlled based on most recent hemoglobin A1C of 7.3%. Complicated by recent surgery. Had a long discussion with patient with decision made to restart metformin post-operatively and follow-up outpatient with her PCP for repeat hemoglobin A1C check in one month; hopefully will be able to discontinue metformin if patient and PCP agree. Patient to be discharged with metformin 500 mg daily with ramp up to 500 mg BID; order sent to patient's pharmacy of choice. -Continue SSI while inpatient -Continue carbohydrate modified diet  Left knee degenerative disc disease Patient is s/p left knee replacement. Management per primary.  Depression Anxiety Continue Zoloft  Primary hypertension Well controlled. Diet controlled normally.  CKD stage IIIa Creatinine is stable.   TRH will continue to follow the patient.  HPI: [name] [name] is a 73 y.o. female with past medical history of depression, anxiety, diabetes mellitus type 2, CKD stage III, hypertension. Patient presented for left knee replacement for management of end-stage left knee degenerative disc disease; blood sugar has elevated post-operatively and orthopedic surgery requested general medicine consult for management and outpatient recommendations. Discussing with patient, she had been doing well with management of her diabetes until this past year. She reports multiple personal matters that have made it difficult to maintain her normal diet control. She reports having been on metformin years ago, but now manages her diabetes via diet control only.  Review of Systems: As mentioned in  the history of present illness. All other systems reviewed and are negative. Past Medical History:  Diagnosis Date   Allergy    Anxiety    Arthritis    Basal cell carcinoma    in vaginal area   Basal cell carcinoma (BCC) in situ of skin    Cataract 2022   Just beginning   Chronic kidney disease    CMV (cytomegalovirus infection) (HCC)    Complication of anesthesia    Depression    Diabetes mellitus without complication (HCC) 2013?   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   PONV (postoperative nausea and vomiting)    Portal vein thrombosis 2015   Pre-diabetes    Shingles    VAIN I (vaginal intraepithelial neoplasia grade I)    Past Surgical History:  Procedure Laterality Date   ABDOMINAL HYSTERECTOMY  1986   BLADDER SURGERY     at the same time of hysterectomy   EXCISION HAGLUND'S DEFORMITY WITH ACHILLES TENDON REPAIR Right 10/11/2021   Procedure: Right gastroc recession, excision of Haglund deformity, Achilles tendon debridement and reconstruction;  Surgeon: Toni Arthurs, MD;  Location: [facility];  Service: Orthopedics;  Laterality: Right;   FOOT SURGERY Left 08/20/2012   Dr. Orland Jarred   HERNIA REPAIR     SKIN CANCER EXCISION     TUBAL LIGATION     WISDOM TOOTH EXTRACTION  1986   Social History:  reports that she has never smoked. She  has never used smokeless tobacco. She reports current alcohol use. She reports that she does not use drugs.  Allergies  Allergen Reactions   Lipitor [Atorvastatin] Other (See Comments)    Muscle spasms   Lovastatin     muscle aches   Statins Other (See Comments)    Muscle aches/spasms   Wheat Swelling   Zetia  [Ezetimibe]     muscle aches   Flagyl [Metronidazole] Nausea And Vomiting and Rash   Leqvio  [Inclisiran Sodium] Swelling and Rash    Erythema and swelling to area of injection x2 injections (swelling/feverish/severe arm rash)    Family History  Problem Relation Age of Onset   Parkinsonism Mother    Breast cancer Mother 14   Arthritis Mother    Cancer Mother    Hearing loss Mother    Miscarriages / [place] Mother    Alcohol abuse Father    Diabetes Father    Lymphoma Son    Diabetes Son    Drug abuse Son    Obesity Son    Arthritis Brother    Hearing loss Brother    Cancer Son     Prior to Admission medications   Medication Sig Start Date End Date Taking? Authorizing Provider  Polyvinyl Alcohol (LUBRICANT DROPS OP) Place 1 drop into both eyes daily as needed (with contact lense use). BIONICS TrueMoist Multi-Purpose Solution   Yes [provider]  sertraline (ZOLOFT) 100 MG tablet Take 1 tablet (100 mg total) by mouth at bedtime. 10/01/23  Yes Pardue, Monico Blitz, DO  sertraline (ZOLOFT) 25 MG tablet TAKE 1 TABLET (25 MG TOTAL) BY MOUTH DAILY. Patient taking differently: Take 25 mg by mouth at bedtime. 09/16/23  Yes Pardue, Monico Blitz, DO  Vitamin D-Vitamin K (D3 + K2 PO) Take 1 tablet by mouth in the morning.  Mega D-3 & MK-7 with Vitamins D-3 & K-2,   Yes [provider]    Physical Exam: Vitals:   10/10/23 2031 10/10/23 2201 10/11/23 0120 10/11/23 0555  BP: 137/83 127/73 111/61 105/64  Pulse: 71 70 66 (!) 54  Resp: 17 17 14 16   Temp: 97.7 F (36.5 C) 98.6 F (37 C) (!) 97.5 F (36.4 C) 97.7 F (36.5 C)  TempSrc: Oral     SpO2: 99% 99% 98% 96%  Weight:      Height:       General exam: Appears calm and comfortable Respiratory system: Clear to auscultation. Respiratory effort normal. Cardiovascular system: S1 & S2 heard, RRR. Gastrointestinal system: Abdomen is nondistended, soft and nontender. No organomegaly or masses felt. Normal bowel sounds heard. Central nervous system: Alert and oriented. No focal neurological deficits. Musculoskeletal:  No edema. No calf tenderness Psychiatry: Judgement and insight appear normal. Mood & affect appropriate.   Data Reviewed:  There are no new results to review at this time.   Family Communication: None at bedside Primary team communication: Message sent via secure chat to primary service, Luther Bradley, PA-C. Dr. Shelle Iron not available via secure chat Thank you very much for involving [place] in the care of your patient.  Author: Jacquelin Hawking, MD 10/11/2023 8:33 AM  For on call review [email or web address].

## 2023-10-11 NOTE — Plan of Care (Signed)
  Problem: Education: Goal: Knowledge of General Education information will improve Description: Including pain rating scale, medication(s)/side effects and non-pharmacologic comfort measures Outcome: Adequate for Discharge   Problem: Health Behavior/Discharge Planning: Goal: Ability to manage health-related needs will improve Outcome: Adequate for Discharge   Problem: Clinical Measurements: Goal: Ability to maintain clinical measurements within normal limits will improve Outcome: Adequate for Discharge Goal: Will remain free from infection Outcome: Adequate for Discharge Goal: Diagnostic test results will improve Outcome: Adequate for Discharge Goal: Respiratory complications will improve Outcome: Adequate for Discharge Goal: Cardiovascular complication will be avoided Outcome: Adequate for Discharge   Problem: Activity: Goal: Risk for activity intolerance will decrease 10/11/2023 1710 by Alice Rieger A, LPN Outcome: Adequate for Discharge 10/11/2023 0955 by Delila Spence, LPN Outcome: Progressing   Problem: Coping: Goal: Level of anxiety will decrease 10/11/2023 1710 by Alice Rieger A, LPN Outcome: Adequate for Discharge 10/11/2023 0955 by Delila Spence, LPN Outcome: Progressing   Problem: Elimination: Goal: Will not experience complications related to bowel motility Outcome: Adequate for Discharge Goal: Will not experience complications related to urinary retention 10/11/2023 1710 by Delila Spence, LPN Outcome: Adequate for Discharge 10/11/2023 0955 by Delila Spence, LPN Outcome: Progressing   Problem: Pain Managment: Goal: General experience of comfort will improve and/or be controlled Outcome: Adequate for Discharge   Problem: Safety: Goal: Ability to remain free from injury will improve Outcome: Adequate for Discharge   Problem: Education: Goal: Knowledge of the prescribed therapeutic regimen will improve Outcome: Adequate for Discharge    Problem: Activity: Goal: Ability to avoid complications of mobility impairment will improve Outcome: Adequate for Discharge Goal: Range of joint motion will improve Outcome: Adequate for Discharge   Problem: Clinical Measurements: Goal: Postoperative complications will be avoided or minimized Outcome: Adequate for Discharge   Problem: Pain Management: Goal: Pain level will decrease with appropriate interventions Outcome: Adequate for Discharge   Problem: Education: Goal: Ability to describe self-care measures that may prevent or decrease complications (Diabetes Survival Skills Education) will improve Outcome: Adequate for Discharge Goal: Individualized Educational Video(s) Outcome: Adequate for Discharge   Problem: Coping: Goal: Ability to adjust to condition or change in health will improve Outcome: Adequate for Discharge   Problem: Fluid Volume: Goal: Ability to maintain a balanced intake and output will improve Outcome: Adequate for Discharge   Problem: Health Behavior/Discharge Planning: Goal: Ability to identify and utilize available resources and services will improve Outcome: Adequate for Discharge Goal: Ability to manage health-related needs will improve Outcome: Adequate for Discharge   Problem: Metabolic: Goal: Ability to maintain appropriate glucose levels will improve Outcome: Adequate for Discharge   Problem: Nutritional: Goal: Maintenance of adequate nutrition will improve Outcome: Adequate for Discharge Goal: Progress toward achieving an optimal weight will improve Outcome: Adequate for Discharge   Problem: Skin Integrity: Goal: Risk for impaired skin integrity will decrease Outcome: Adequate for Discharge   Problem: Tissue Perfusion: Goal: Adequacy of tissue perfusion will improve Outcome: Adequate for Discharge
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
## 2023-10-11 NOTE — Discharge Summary (Signed)
 Patient ID: [name] [name]: [id number] DOB/AGE: May 08, 1951 73 y.o.  Admit date: 10/10/2023 Discharge date: 10/11/2023  Admission Diagnoses:  Left knee, DJD  Discharge Diagnoses:  Principal Problem:   Left knee DJD   Past Medical History:  Diagnosis Date   Allergy    Anxiety    Arthritis    Basal cell carcinoma    in vaginal area   Basal cell carcinoma (BCC) in situ of skin    Cataract 2022   Just beginning   Chronic kidney disease    CMV (cytomegalovirus infection) (HCC)    Complication of anesthesia    Depression    Diabetes mellitus without complication (HCC) 2013?   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   PONV (postoperative nausea and vomiting)    Portal vein thrombosis 2015   Pre-diabetes    Shingles    VAIN I (vaginal intraepithelial neoplasia grade I)     Surgeries: Procedure(s): ARTHROPLASTY, KNEE, TOTAL on 10/10/2023   Consultants:   Discharged Condition: Improved  Hospital Course: NIAJAH SIPOS is an 73 y.o. female who was admitted 10/10/2023 for operative treatment ofLeft knee DJD. Patient has severe unremitting pain that affects sleep, daily activities, and work/hobbies. After pre-op clearance the patient was taken to the operating room on 10/10/2023 and underwent  Procedure(s): ARTHROPLASTY, KNEE, TOTAL.    Patient was given perioperative antibiotics:  Anti-infectives (From admission, onward)    Start     Dose/Rate Route Frequency Ordered Stop   10/10/23 2000  ceFAZolin (ANCEF) IVPB 2g/100 mL premix        2 g 200 mL/hr over 30 Minutes Intravenous Every 6 hours 10/10/23 1742 10/11/23 0228   10/10/23 1000  ceFAZolin (ANCEF) IVPB 2g/100 mL premix        2 g 200 mL/hr over 30 Minutes Intravenous On call to O.R. 10/10/23 0946 10/10/23  1351        Patient was given sequential compression devices, early ambulation, and chemoprophylaxis to prevent DVT. Patient worked with PT and was meeting their goals regarding safe ambulation and transfers.  Patient benefited maximally from hospital stay and there were no complications.    Recent vital signs: Patient Vitals for the past 24 hrs:  BP Temp Temp src Pulse Resp SpO2  10/11/23 1335 (!) 127/54 97.6 F (36.4 C) -- (!) 58 18 96 %  10/11/23 0917 115/69 97.7 F (36.5 C) -- 68 16 97 %  10/11/23 0555 105/64 97.7 F (36.5 C) -- (!) 54 16 96 %  10/11/23 0120 111/61 (!) 97.5 F (36.4 C) -- 66 14 98 %  10/10/23 2201 127/73 98.6 F (37 C) -- 70 17 99 %  10/10/23 2031 137/83 97.7 F (36.5 C) Oral 71 17 99 %  10/10/23 1743 127/71 98 F (36.7 C) Oral 61 18 99 %  10/10/23 1645 115/66 -- -- 80 15 94 %  10/10/23 1630 114/69 -- -- 70 20 93 %  10/10/23 1615 (!) 121/59 -- -- 69 13 96 %     Recent laboratory studies:  Recent Labs    10/11/23 0318  WBC 12.8*  HGB 12.9  HCT 40.3  PLT 186  NA 137  K 4.6  CL 106  CO2 23  BUN 16  CREATININE 1.26*  GLUCOSE 186*  CALCIUM 8.4*     Discharge Medications:   Allergies as of 10/11/2023       Reactions   Lipitor [atorvastatin] Other (See Comments)   Muscle spasms   Lovastatin    muscle aches   Statins Other (See Comments)   Muscle aches/spasms   Wheat Swelling   Zetia  [ezetimibe]    muscle aches   Flagyl [metronidazole] Nausea And Vomiting, Rash   Leqvio [inclisiran Sodium] Swelling, Rash   Erythema and swelling to area of injection x2 injections (swelling/feverish/severe arm rash)        Medication List     STOP taking these medications    D3 + K2 PO       TAKE these medications    LUBRICANT DROPS OP Place 1 drop into both eyes daily as needed (with contact lense use). BIONICS TrueMoist Multi-Purpose Solution   metFORMIN 500 MG tablet Commonly known as: GLUCOPHAGE Take 1 tablet (500 mg total) by mouth  daily with breakfast for 7 days, THEN 1 tablet (500 mg total) 2 (two) times daily with a meal. Increase dose as prescribed IF NOT having significant gastrointestinal symptoms.. Start taking on: October 11, 2023   sertraline 25 MG tablet Commonly known as: ZOLOFT TAKE 1 TABLET (25 MG TOTAL) BY MOUTH DAILY. What changed: when to take this   sertraline 100 MG tablet Commonly known as: ZOLOFT Take 1 tablet (100 mg total) by mouth at bedtime. What changed: Another medication with the same name was changed. Make sure you understand how and when to take each.        Diagnostic Studies: DG Knee 1-2 Views Left Result Date: 10/10/2023 CLINICAL DATA:  Status post total knee replacement. EXAM: LEFT KNEE - 2 VIEW COMPARISON:  None Available. FINDINGS: Total knee arthroplasty identified with cemented femoral and tibial component. Patellar button. Soft tissue thickening gas identified including air in the joint space with some fluid. Expected alignment. Preserved bone mineralization. Imaging was obtained to aid in treatment. IMPRESSION: Acute surgical changes of total knee arthroplasty. Electronically Signed   By: Karen Kays M.D.   On: 10/10/2023 16:25    Disposition: Discharge disposition: 01-Home or Self Care       Discharge Instructions     Call MD for:  difficulty breathing, headache or visual disturbances   Complete by: As directed    Call MD for:  extreme fatigue   Complete by: As directed    Call MD for:  hives   Complete by: As directed    Call MD for:  persistant dizziness or light-headedness   Complete by: As directed    Call MD for:  persistant nausea and vomiting   Complete by: As directed    Call MD for:  redness, tenderness, or signs of infection (pain, swelling, redness, odor or green/yellow discharge around incision site)   Complete by: As directed    Call MD for:  severe uncontrolled pain   Complete by: As directed    Call MD for:  temperature >100.4   Complete by: As  directed    Diet - low sodium heart healthy   Complete by: As directed    Discharge instructions   Complete by: As directed    Increase activity slowly   Complete by: As directed         Follow-up Information     Jene Every, MD. Go on 10/24/2023.  Specialty: Orthopedic Surgery Why: You are scheduled for first post op appt on Friday March 28 at 9:45am. Contact information: [address] [place] [address] [phone]         [facility] Follow up.   Specialty: Home Health Services Why: [facility] will provide PT in the home after discharge. Contact information: [address] [place] [address] [phone]         Sherlyn Hay, DO. Schedule an appointment as soon as possible for a visit in 4 week(s).   Specialty: Family Medicine Why: For hospital follow-up, diabetes management Contact information: [address] [place] [address] [phone]               Assessment/Plan: 1 Day Post-Op Procedure(s) (LRB): ARTHROPLASTY, KNEE, TOTAL (Left)   Assessment: Letta is a very pleasant 73 year old female who is postop day 1 from a left total knee arthroplasty.  Overall the surgery was uncomplicated and her hospital course has been without complications.  She states that her postoperative pain is well-controlled with oral pain medications.  She is tolerating oral intake well.  She has not yet been up with therapy.  Negative void, negative flatus, negative bowel movement.  Dressing is clean dry and intact.  Patient is compliant with knee immobilizing brace.     Plan: Up with therapy, work with stairs. Discharge to home potentially today pending physical therapy clearance, positive void, positive flatus. Continue incentive spirometry Continue DVT prophylaxis with ASA Hospitalist consultation placed by Dr. Shelle Iron this morning to evaluate untreated diabetes, appreciate hospitalist recommendations for  medication control. Home health physical therapy after discharge Follow-up in 10 to 14 days after discharge with Dr. Ermelinda Das clinic    Signed: Alvy Beal 10/11/2023, 4:11 PM

## 2023-10-11 NOTE — TOC Transition Note (Signed)
 Transition of Care [facility]) - Discharge Note  Patient Details  Name: [name] [name] MRN: [id number] Date of Birth: 1950/12/19  [facility] CM/SW Contact:  Ewing Schlein, LCSW Phone Number: 10/11/2023, 9:58 AM  Clinical Narrative: CSW met with patient to confirm discharge plan. Patient will go home with HHPT, which was prearranged in with [facility]. Patient has a rolling walker and ice machine at home, so there are no DME needs at this time. TOC signing off.  Final next level of care: Home w Home Health Services Barriers to Discharge: No Barriers Identified  Patient Goals and CMS Choice Patient states their goals for this hospitalization and ongoing recovery are:: Discharge home with HHPT CMS Medicare.gov Compare Post Acute Care list provided to:: Patient Choice offered to / list presented to : Patient  Discharge Plan and Services Additional resources added to the After Visit Summary for          DME Arranged: N/A DME Agency: NA HH Arranged: PT HH Agency: [facility] Representative spoke with at [facility] Agency: Prearranged in orthopedist's office  Social Drivers of Health (SDOH) Interventions SDOH Screenings   Food Insecurity: No Food Insecurity (10/10/2023)  Housing: Low Risk  (10/10/2023)  Transportation Needs: No Transportation Needs (10/10/2023)  Utilities: Not At Risk (10/10/2023)  Alcohol Screen: Low Risk  (08/16/2023)  Depression (PHQ2-9): Low Risk  (10/01/2023)  Financial Resource Strain: Low Risk  (08/16/2023)  Physical Activity: Inactive (08/16/2023)  Social Connections: Unknown (10/10/2023)  Stress: Stress Concern Present (08/16/2023)  Tobacco Use: Low Risk  (10/10/2023)   Readmission Risk Interventions     No data to display

## 2023-10-11 NOTE — Plan of Care (Signed)
   Problem: Activity: Goal: Risk for activity intolerance will decrease Outcome: Progressing   Problem: Coping: Goal: Level of anxiety will decrease Outcome: Progressing   Problem: Elimination: Goal: Will not experience complications related to urinary retention Outcome: Progressing

## 2023-10-11 NOTE — Progress Notes (Addendum)
    Subjective: 1 Day Post-Op Procedure(s) (LRB): ARTHROPLASTY, KNEE, TOTAL (Left) Patient reports pain as mild.   Denies CP or SOB.  Patient has not voided as of yet.  Foley catheter removed  this morning.  Negative flatus.  Negative bowel movement.   Patient has not yet worked with physical therapy. Objective: Vital signs in last 24 hours: Temp:  [97.5 F (36.4 C)-98.6 F (37 C)] 97.7 F (36.5 C) (03/15 0555) Pulse Rate:  [54-84] 54 (03/15 0555) Resp:  [13-20] 16 (03/15 0555) BP: (104-140)/(55-84) 105/64 (03/15 0555) SpO2:  [93 %-99 %] 96 % (03/15 0555) Weight:  [79.4 kg] 79.4 kg (03/14 0949)  Intake/Output from previous day: 03/14 0701 - 03/15 0700 In: 2192.3 [P.O.:600; I.V.:1218.4; IV Piggyback:374] Out: 1525 [Urine:1425; Blood:100] Intake/Output this shift: No intake/output data recorded.  Labs: Recent Labs    10/11/23 0318  HGB 12.9   Recent Labs    10/11/23 0318  WBC 12.8*  RBC 4.42  HCT 40.3  PLT 186   Recent Labs    10/11/23 0318  NA 137  K 4.6  CL 106  CO2 23  BUN 16  CREATININE 1.26*  GLUCOSE 186*  CALCIUM 8.4*   No results for input(s): "LABPT", "INR" in the last 72 hours.  Physical Exam: Neurologically intact Neurovascular intact Sensation intact distally Intact pulses distally Dorsiflexion/Plantar flexion intact Incision: dressing C/D/I Compartment soft  Patient has Aquacel dressing, Ace wrap bandage, and knee immobilizer in place. Body mass index is 33.07 kg/m.   Assessment/Plan: 1 Day Post-Op Procedure(s) (LRB): ARTHROPLASTY, KNEE, TOTAL (Left)  Assessment: [name] [name] is a very pleasant 73 year old female who is postop day 1 from a left total knee arthroplasty.  Overall the surgery was uncomplicated and her hospital course has been without complications.  She states that her postoperative pain is well-controlled with oral pain medications.  She is tolerating oral intake well.  She has not yet been up with therapy.  Negative void,  negative flatus, negative bowel movement.  Dressing is clean dry and intact.  Patient is compliant with knee immobilizing brace.    Plan: Up with therapy, work with stairs. Discharge to home potentially today pending physical therapy clearance, positive void, positive flatus. Continue incentive spirometry Continue DVT prophylaxis with ASA Hospitalist consultation placed by Dr. Shelle Iron this morning to evaluate untreated diabetes, appreciate hospitalist recommendations for medication control. Home health physical therapy after discharge Follow-up in 10 to 14 days after discharge with Dr. Ermelinda Das clinic  [name] for Dr. Venita Lick [facility] [phone] 10/11/2023, 8:08 AM

## 2023-10-11 NOTE — Plan of Care (Signed)
  Problem: Education: Goal: Knowledge of General Education information will improve Description: Including pain rating scale, medication(s)/side effects and non-pharmacologic comfort measures Outcome: Progressing   Problem: Health Behavior/Discharge Planning: Goal: Ability to manage health-related needs will improve Outcome: Progressing   Problem: Clinical Measurements: Goal: Ability to maintain clinical measurements within normal limits will improve Outcome: Progressing Goal: Will remain free from infection Outcome: Progressing Goal: Diagnostic test results will improve Outcome: Progressing Goal: Respiratory complications will improve Outcome: Progressing Goal: Cardiovascular complication will be avoided Outcome: Progressing   Problem: Activity: Goal: Risk for activity intolerance will decrease Outcome: Progressing   Problem: Nutrition: Goal: Adequate nutrition will be maintained Outcome: Completed/Met   Problem: Coping: Goal: Level of anxiety will decrease Outcome: Progressing   Problem: Elimination: Goal: Will not experience complications related to bowel motility Outcome: Progressing Goal: Will not experience complications related to urinary retention Outcome: Progressing   Problem: Pain Managment: Goal: General experience of comfort will improve and/or be controlled Outcome: Progressing   Problem: Safety: Goal: Ability to remain free from injury will improve Outcome: Progressing   Problem: Education: Goal: Knowledge of the prescribed therapeutic regimen will improve Outcome: Progressing Goal: Individualized Educational Video(s) Outcome: Completed/Met   Problem: Activity: Goal: Ability to avoid complications of mobility impairment will improve Outcome: Progressing Goal: Range of joint motion will improve Outcome: Adequate for Discharge   Problem: Clinical Measurements: Goal: Postoperative complications will be avoided or minimized Outcome:  Progressing   Problem: Pain Management: Goal: Pain level will decrease with appropriate interventions Outcome: Progressing

## 2023-10-13 ENCOUNTER — Encounter (HOSPITAL_COMMUNITY): Payer: Self-pay | Admitting: Specialist

## 2023-10-13 NOTE — Anesthesia Postprocedure Evaluation (Signed)
 Anesthesia Post Note  Patient: [name] [name]  Procedure(s) Performed: ARTHROPLASTY, KNEE, TOTAL (Left: Knee)     Patient location during evaluation: Nursing Unit Anesthesia Type: Regional Level of consciousness: oriented and awake and alert Pain management: pain level controlled Vital Signs Assessment: post-procedure vital signs reviewed and stable Respiratory status: spontaneous breathing and respiratory function stable Cardiovascular status: blood pressure returned to baseline and stable Postop Assessment: no headache, no backache, no apparent nausea or vomiting and patient able to bend at knees Anesthetic complications: no  No notable events documented.  Last Vitals:  Vitals:   10/11/23 0917 10/11/23 1335  BP: 115/69 (!) 127/54  Pulse: 68 (!) 58  Resp: 16 18  Temp: 36.5 C 36.4 C  SpO2: 97% 96%    Last Pain:  Vitals:   10/11/23 1635  TempSrc:   PainSc: 5    Pain Goal: Patients Stated Pain Goal: 3 (10/11/23 0142)                 Trevor Iha

## 2023-11-18 ENCOUNTER — Ambulatory Visit: Payer: Self-pay | Admitting: Family Medicine

## 2023-11-18 ENCOUNTER — Encounter: Payer: Self-pay | Admitting: Family Medicine

## 2023-11-18 VITALS — BP 116/70 | HR 87 | Ht 61.0 in | Wt 171.7 lb

## 2023-11-18 DIAGNOSIS — Z7984 Long term (current) use of oral hypoglycemic drugs: Secondary | ICD-10-CM

## 2023-11-18 DIAGNOSIS — N1831 Chronic kidney disease, stage 3a: Secondary | ICD-10-CM

## 2023-11-18 DIAGNOSIS — Z96652 Presence of left artificial knee joint: Secondary | ICD-10-CM | POA: Diagnosis not present

## 2023-11-18 DIAGNOSIS — E1122 Type 2 diabetes mellitus with diabetic chronic kidney disease: Secondary | ICD-10-CM

## 2023-11-18 MED ORDER — METFORMIN HCL 500 MG PO TABS: 500.0000 mg | ORAL_TABLET | Freq: Every day | ORAL | 0 refills | Status: DC

## 2023-11-18 NOTE — Progress Notes (Signed)
 Established patient visit   Patient: [name] [name]   DOB: 1950-09-11   73 y.o. Female  MRN: [id number] Visit Date: 11/18/2023  Today's healthcare provider: Carlean Charter, DO   Chief Complaint  Patient presents with   Follow-up   Diabetes    Glucose levels were abnormal in hospital, dr in hospital put pt on metformin  500mg  daily for 7 days and then 2x daily after that, pt will need new prescription if you would like her to continue  Pt reports eating a lot more and it not watching what she eats, pt has been doing knee sugery pt as directed, walks up steps daily as well   Subjective    HPI [name] [name] is a 73 year old female who presents for follow-up after a recent hospital visit.  She experienced elevated blood glucose levels during a recent hospital visit and was started on metformin . She has been receiving messages to refill her prescription but decided to wait for this appointment. She has been monitoring her blood glucose at home, noting a reading of 113 mg/dL after eating one day and 157 mg/dL after eating another day. Her A1c 08/26/2023 was 7.3, up from a previous 6.0 in 2023. She attributes the increase to pain and decreased activity prior to her recent surgery. She is currently taking metformin  twice daily, typically with breakfast or shortly after. She wants to reduce her medication as her activity level increases and her diet improves.  She underwent left knee replacement surgery approximately six weeks ago. She attended physical therapy at home for two weeks post-surgery and has recently started outpatient therapy. She is doing well with exercises but experiences increased swelling and difficulty moving as the day progresses. She manages this by icing her knee regularly. She is not supposed to drive but has been doing so using her other leg.  No nausea, vomiting, diarrhea, headaches, vision changes, chest pain, or shortness of breath. She feels cold more often than  usual, requiring blankets, but denies having a fever. She recalls a mild fever of 101F shortly after surgery (not meeting 101.4 criteria she was given) but nothing significant since then.     Medications: Outpatient Medications Prior to Visit  Medication Sig   sertraline  (ZOLOFT ) 100 MG tablet Take 1 tablet (100 mg total) by mouth at bedtime.   [DISCONTINUED] metFORMIN  (GLUCOPHAGE ) 500 MG tablet Take 1 tablet (500 mg total) by mouth daily with breakfast for 7 days, THEN 1 tablet (500 mg total) 2 (two) times daily with a meal. Increase dose as prescribed IF NOT having significant gastrointestinal symptoms..   [DISCONTINUED] Polyvinyl Alcohol (LUBRICANT DROPS OP) Place 1 drop into both eyes daily as needed (with contact lense use). BIONICS TrueMoist Multi-Purpose Solution (Patient not taking: Reported on 11/18/2023)   [DISCONTINUED] sertraline  (ZOLOFT ) 25 MG tablet TAKE 1 TABLET (25 MG TOTAL) BY MOUTH DAILY. (Patient not taking: Reported on 11/18/2023)   No facility-administered medications prior to visit.    Review of Systems  Constitutional:  Negative for appetite change, chills, fatigue and fever.  Respiratory:  Negative for chest tightness and shortness of breath.   Cardiovascular:  Negative for chest pain and palpitations.  Gastrointestinal:  Negative for abdominal pain, nausea and vomiting.  Endocrine: Positive for cold intolerance.  Musculoskeletal:  Positive for arthralgias (left knee, s/p left knee replacement).  Neurological:  Negative for dizziness, numbness and headaches.        Objective    BP 116/70 (BP Location: Left  Arm, Patient Position: Sitting, Cuff Size: Normal)   Pulse 87   Ht 5\' 1"  (1.549 m)   Wt 171 lb 11.2 oz (77.9 kg)   SpO2 98%   BMI 32.44 kg/m     Physical Exam Constitutional:      Appearance: Normal appearance.  HENT:     Head: Normocephalic and atraumatic.  Eyes:     General: No scleral icterus.    Extraocular Movements: Extraocular movements  intact.     Conjunctiva/sclera: Conjunctivae normal.  Cardiovascular:     Rate and Rhythm: Normal rate and regular rhythm.     Pulses: Normal pulses.     Heart sounds: Normal heart sounds.  Pulmonary:     Effort: Pulmonary effort is normal. No respiratory distress.     Breath sounds: Normal breath sounds.  Musculoskeletal:     Right lower leg: No edema.     Left lower leg: No edema.  Skin:    General: Skin is warm and dry.          Comments: Vertical incision to the left knee, covered with Steri-Strips.  Area appears to be healing well.  Neurological:     Mental Status: She is alert and oriented to person, place, and time. Mental status is at baseline.  Psychiatric:        Mood and Affect: Mood normal.        Behavior: Behavior normal.      No results found for any visits on 11/18/23.  Assessment & Plan    Type 2 diabetes mellitus with stage 3a chronic kidney disease, without long-term current use of insulin  (HCC) -     metFORMIN  HCl; Take 1 tablet (500 mg total) by mouth daily with breakfast. Increase dose as prescribed IF NOT having significant gastrointestinal symptoms.  Dispense: 180 tablet; Refill: 0      Type 2 diabetes mellitus with stage 3 chronic kidney disease, without long-term current use of insulin  A1c increased to 7.3. Blood sugars variable. On metformin , one tablets twice daily. Recent pain prior to surgery may have impacted control. Motivated to improve through diet and exercise. - Continue metformin , one tablet twice daily. - Patient prefers to taper off metformin  if able to return to prediabetic range - Check fasting blood sugars in the morning. - Monitor blood sugars and adjust metformin  (reduced to 1 tablet daily before breakfast) if fasting levels drop below 100 mg/dL. - Reassess A1c in three months. - Send 90-day prescription for metformin  to [facility] on [address]. - Encourage diet and exercise as tolerated.  Status post total left knee replacement Good  progress with physical therapy. Swelling and movement difficulty managed with icing. No significant complications. - Continue physical therapy as scheduled. - Follow up with orthopedic surgeon this Friday for six-week postoperative check.   Return in about 3 months (around 02/09/2024) for DM.      I discussed the assessment and treatment plan with the patient  The patient was provided an opportunity to ask questions and all were answered. The patient agreed with the plan and demonstrated an understanding of the instructions.   The patient was advised to call back or seek an in-person evaluation if the symptoms worsen or if the condition fails to improve as anticipated.    Carlean Charter, DO  [facility] [phone] (phone) [phone] (fax)  [facility]

## 2023-11-21 ENCOUNTER — Telehealth: Payer: Self-pay | Admitting: Family Medicine

## 2024-01-01 ENCOUNTER — Encounter: Payer: Self-pay | Admitting: Family Medicine

## 2024-01-01 ENCOUNTER — Ambulatory Visit: Admitting: Family Medicine

## 2024-01-01 VITALS — BP 125/74 | HR 70 | Resp 14 | Ht 60.5 in | Wt 163.1 lb

## 2024-01-01 DIAGNOSIS — E785 Hyperlipidemia, unspecified: Secondary | ICD-10-CM

## 2024-01-01 DIAGNOSIS — E1169 Type 2 diabetes mellitus with other specified complication: Secondary | ICD-10-CM

## 2024-01-01 DIAGNOSIS — E1159 Type 2 diabetes mellitus with other circulatory complications: Secondary | ICD-10-CM

## 2024-01-01 DIAGNOSIS — N1831 Chronic kidney disease, stage 3a: Secondary | ICD-10-CM | POA: Diagnosis not present

## 2024-01-01 DIAGNOSIS — Z7984 Long term (current) use of oral hypoglycemic drugs: Secondary | ICD-10-CM

## 2024-01-01 DIAGNOSIS — M5430 Sciatica, unspecified side: Secondary | ICD-10-CM

## 2024-01-01 DIAGNOSIS — Z780 Asymptomatic menopausal state: Secondary | ICD-10-CM

## 2024-01-01 DIAGNOSIS — I152 Hypertension secondary to endocrine disorders: Secondary | ICD-10-CM

## 2024-01-01 DIAGNOSIS — Z13820 Encounter for screening for osteoporosis: Secondary | ICD-10-CM

## 2024-01-01 MED ORDER — EMPAGLIFLOZIN 10 MG PO TABS: 10.0000 mg | ORAL_TABLET | Freq: Every day | ORAL | 1 refills | Status: DC

## 2024-01-01 NOTE — Progress Notes (Signed)
 Established patient visit   Patient: [name] [name]   DOB: 08-29-1950   73 y.o. Female  MRN: [id number] Visit Date: 01/01/2024  Today's healthcare provider: Carlean Charter, DO   Chief Complaint  Patient presents with   Diabetes    Has not been taking metformin  as she has too, does not make her feel good.    Hypertension   Subjective    Diabetes  Hypertension   [name] [name] is a 73 year old female with diabetes and hypertension who presents with medication intolerance and low blood sugar episodes.  She experiences intolerance to metformin , with symptoms of feeling 'sick' and indigestion. After discontinuing metformin , these symptoms improved.  She has episodes of hypoglycemia, particularly during activities like gardening, with blood sugar levels dropping to around 60 mg/dL. She manages these episodes by eating quickly and experiences symptoms such as shaking during the episodes.  She completed physical therapy and now has pain across her back and down her leg, which she attributes to prolonged use of a recliner post-knee surgery. She is managing this with stretching and sitting in straight-back chairs.  She has not been monitoring her blood pressure at home due to not having a cuff, but recalls previous readings being low. She has not been on any blood pressure medications previously.  No recent blood work has been done at nephrology. No history of thyroid problems. No abnormal fatigue, numbness, tingling, diarrhea, or constipation.      Medications: Outpatient Medications Prior to Visit  Medication Sig Note   aspirin  81 MG chewable tablet Chew 81 mg by mouth. Once a week    sertraline  (ZOLOFT ) 100 MG tablet Take 1 tablet (100 mg total) by mouth at bedtime.    [DISCONTINUED] metFORMIN  (GLUCOPHAGE ) 500 MG tablet Take 1 tablet (500 mg total) by mouth daily with breakfast. Increase dose as prescribed IF NOT having significant gastrointestinal symptoms. (Patient not  taking: Reported on 01/01/2024) 01/01/2024: GI upste, general malaise   No facility-administered medications prior to visit.        Objective    BP 125/74 (BP Location: Left Arm, Patient Position: Sitting, Cuff Size: Normal)   Pulse 70   Resp 14   Ht 5' 0.5 (1.537 m)   Wt 163 lb 1.6 oz (74 kg)   SpO2 97%   BMI 31.33 kg/m     Physical Exam Vitals and nursing note reviewed.  Constitutional:      General: She is not in acute distress.    Appearance: Normal appearance.  HENT:     Head: Normocephalic and atraumatic.   Eyes:     General: No scleral icterus.    Conjunctiva/sclera: Conjunctivae normal.    Cardiovascular:     Rate and Rhythm: Normal rate.  Pulmonary:     Effort: Pulmonary effort is normal.   Skin:    Comments: Well-healed scar to left knee, vertically, from knee replacement   Neurological:     Mental Status: She is alert and oriented to person, place, and time. Mental status is at baseline.   Psychiatric:        Mood and Affect: Mood normal.        Behavior: Behavior normal.      Results for orders placed or performed in visit on 01/01/24  Comprehensive metabolic panel with GFR  Result Value Ref Range   Glucose 134 (H) 70 - 99 mg/dL   BUN 17 8 - 27 mg/dL   Creatinine, Ser  1.26 (H) 0.57 - 1.00 mg/dL   eGFR 45 (L) >63 KZ/SWF/0.93   BUN/Creatinine Ratio 13 12 - 28   Sodium 142 134 - 144 mmol/L   Potassium 4.3 3.5 - 5.2 mmol/L   Chloride 108 (H) 96 - 106 mmol/L   CO2 16 (L) 20 - 29 mmol/L   Calcium 9.8 8.7 - 10.3 mg/dL   Total Protein 7.4 6.0 - 8.5 g/dL   Albumin 4.6 3.8 - 4.8 g/dL   Globulin, Total 2.8 1.5 - 4.5 g/dL   Bilirubin Total 0.3 0.0 - 1.2 mg/dL   Alkaline Phosphatase 117 44 - 121 IU/L   AST 23 0 - 40 IU/L   ALT 21 0 - 32 IU/L  Hemoglobin A1c  Result Value Ref Range   Hgb A1c MFr Bld 7.1 (H) 4.8 - 5.6 %   Est. average glucose Bld gHb Est-mCnc 157 mg/dL  Lipid panel  Result Value Ref Range   Cholesterol, Total 201 (H) 100 - 199  mg/dL   Triglycerides 235 0 - 149 mg/dL   HDL 47 >57 mg/dL   VLDL Cholesterol Cal 24 5 - 40 mg/dL   LDL Chol Calc (NIH) 322 (H) 0 - 99 mg/dL   Chol/HDL Ratio 4.3 0.0 - 4.4 ratio    Assessment & Plan    Hypertension associated with diabetes (HCC) -     Empagliflozin ; Take 1 tablet (10 mg total) by mouth daily before breakfast.  Dispense: 90 tablet; Refill: 1 -     Comprehensive metabolic panel with GFR -     Hemoglobin A1c  Hyperlipidemia associated with type 2 diabetes mellitus (HCC) -     Lipid panel  Stage 3a chronic kidney disease (HCC)  Sciatica, unspecified laterality  Encounter for osteoporosis screening in asymptomatic postmenopausal patient -     DG Bone Density; Future      Type 2 Diabetes Mellitus Discontinued metformin  due to gastrointestinal side effects. Experiences hypoglycemia during physical activity. Jardiance  preferred for renal and cardiac benefits. - Prescribe Jardiance  (empagliflozin ) at initial low dose. - Monitor blood glucose levels regularly. - Educate on recognizing and managing hypoglycemia. - Consider increasing Jardiance  dose if well tolerated and indicated.  Hypertension Blood pressure borderline. Discussed deferring ACE inhibitor initiation until nephrologist consultation to assess Jardiance  effects. - Discuss potential initiation of lisinopril for renal protection with nephrologist in November.  Chronic kidney disease stage 3a Continue to optimize risk factors.  Follows with nephrology; will defer to specialist management.  Starting Jardiance  to address diabetes today, which will also provide renal protection.  Sciatica Pain likely due to prolonged sitting post-knee surgery. Stretching exercises recommended. - Provide printout of sciatica stretching exercises and review with patient. - Encourage regular stretching and strengthening exercises.  General Health Maintenance Due for tetanus vaccine, mammogram, and bone density scan. -  Recommend tetanus vaccine, to be obtained at pharmacy due to insurance requirements. - Order mammogram. - Order bone density scan.    Return in about 3 months (around 04/02/2024) for DM.      I discussed the assessment and treatment plan with the patient  The patient was provided an opportunity to ask questions and all were answered. The patient agreed with the plan and demonstrated an understanding of the instructions.   The patient was advised to call back or seek an in-person evaluation if the symptoms worsen or if the condition fails to improve as anticipated.    Carlean Charter, DO  [facility] [phone] (phone)  [phone] (fax)  [facility]

## 2024-01-01 NOTE — Patient Instructions (Signed)
 Tdap (tetanus, diphtheria, and pertussis) vaccine  Please call the [facility] [phone]) to schedule a routine screening mammogram and bone density study.

## 2024-01-02 LAB — LIPID PANEL

## 2024-01-06 LAB — COMPREHENSIVE METABOLIC PANEL WITH GFR
ALT: 21 IU/L (ref 0–32)
AST: 23 IU/L (ref 0–40)
Albumin: 4.6 g/dL (ref 3.8–4.8)
Alkaline Phosphatase: 117 IU/L (ref 44–121)
BUN/Creatinine Ratio: 13 (ref 12–28)
BUN: 17 mg/dL (ref 8–27)
Bilirubin Total: 0.3 mg/dL (ref 0.0–1.2)
CO2: 16 mmol/L — ABNORMAL LOW (ref 20–29)
Calcium: 9.8 mg/dL (ref 8.7–10.3)
Chloride: 108 mmol/L — ABNORMAL HIGH (ref 96–106)
Creatinine, Ser: 1.26 mg/dL — ABNORMAL HIGH (ref 0.57–1.00)
Globulin, Total: 2.8 g/dL (ref 1.5–4.5)
Glucose: 134 mg/dL — ABNORMAL HIGH (ref 70–99)
Potassium: 4.3 mmol/L (ref 3.5–5.2)
Sodium: 142 mmol/L (ref 134–144)
Total Protein: 7.4 g/dL (ref 6.0–8.5)
eGFR: 45 mL/min/{1.73_m2} — ABNORMAL LOW (ref >59)

## 2024-01-06 LAB — LIPID PANEL
Chol/HDL Ratio: 4.3 ratio (ref 0.0–4.4)
Cholesterol, Total: 201 mg/dL — ABNORMAL HIGH (ref 100–199)
HDL: 47 mg/dL (ref >39)
LDL Chol Calc (NIH): 130 mg/dL — ABNORMAL HIGH (ref 0–99)
Triglycerides: 133 mg/dL (ref 0–149)
VLDL Cholesterol Cal: 24 mg/dL (ref 5–40)

## 2024-01-06 LAB — HEMOGLOBIN A1C
Est. average glucose Bld gHb Est-mCnc: 157 mg/dL
Hgb A1c MFr Bld: 7.1 % — ABNORMAL HIGH (ref 4.8–5.6)

## 2024-01-13 ENCOUNTER — Ambulatory Visit: Payer: Self-pay | Admitting: Family Medicine

## 2024-02-06 ENCOUNTER — Other Ambulatory Visit: Payer: Self-pay | Admitting: Family Medicine

## 2024-02-06 DIAGNOSIS — Z1231 Encounter for screening mammogram for malignant neoplasm of breast: Secondary | ICD-10-CM

## 2024-02-09 ENCOUNTER — Ambulatory Visit: Admitting: Family Medicine

## 2024-02-10 ENCOUNTER — Telehealth: Payer: Self-pay

## 2024-02-10 NOTE — Telephone Encounter (Signed)
 Spoke with pt and made aware of mammogram order being placed. Pt verbalized understanding

## 2024-02-17 ENCOUNTER — Other Ambulatory Visit: Payer: Self-pay | Admitting: Family Medicine

## 2024-02-17 DIAGNOSIS — E1122 Type 2 diabetes mellitus with diabetic chronic kidney disease: Secondary | ICD-10-CM

## 2024-02-27 DIAGNOSIS — M51372 Other intervertebral disc degeneration, lumbosacral region with discogenic back pain and lower extremity pain: Secondary | ICD-10-CM | POA: Diagnosis not present

## 2024-02-27 DIAGNOSIS — M7918 Myalgia, other site: Secondary | ICD-10-CM | POA: Diagnosis not present

## 2024-02-27 DIAGNOSIS — M51362 Other intervertebral disc degeneration, lumbar region with discogenic back pain and lower extremity pain: Secondary | ICD-10-CM | POA: Diagnosis not present

## 2024-02-27 DIAGNOSIS — M9903 Segmental and somatic dysfunction of lumbar region: Secondary | ICD-10-CM | POA: Diagnosis not present

## 2024-02-27 DIAGNOSIS — M5442 Lumbago with sciatica, left side: Secondary | ICD-10-CM | POA: Diagnosis not present

## 2024-02-27 DIAGNOSIS — M9904 Segmental and somatic dysfunction of sacral region: Secondary | ICD-10-CM | POA: Diagnosis not present

## 2024-03-01 DIAGNOSIS — M9903 Segmental and somatic dysfunction of lumbar region: Secondary | ICD-10-CM | POA: Diagnosis not present

## 2024-03-01 DIAGNOSIS — M9904 Segmental and somatic dysfunction of sacral region: Secondary | ICD-10-CM | POA: Diagnosis not present

## 2024-03-01 DIAGNOSIS — M5442 Lumbago with sciatica, left side: Secondary | ICD-10-CM | POA: Diagnosis not present

## 2024-03-01 DIAGNOSIS — M51362 Other intervertebral disc degeneration, lumbar region with discogenic back pain and lower extremity pain: Secondary | ICD-10-CM | POA: Diagnosis not present

## 2024-03-01 DIAGNOSIS — M51372 Other intervertebral disc degeneration, lumbosacral region with discogenic back pain and lower extremity pain: Secondary | ICD-10-CM | POA: Diagnosis not present

## 2024-03-01 DIAGNOSIS — M7918 Myalgia, other site: Secondary | ICD-10-CM | POA: Diagnosis not present

## 2024-03-03 DIAGNOSIS — M9903 Segmental and somatic dysfunction of lumbar region: Secondary | ICD-10-CM | POA: Diagnosis not present

## 2024-03-03 DIAGNOSIS — M51372 Other intervertebral disc degeneration, lumbosacral region with discogenic back pain and lower extremity pain: Secondary | ICD-10-CM | POA: Diagnosis not present

## 2024-03-03 DIAGNOSIS — M5442 Lumbago with sciatica, left side: Secondary | ICD-10-CM | POA: Diagnosis not present

## 2024-03-03 DIAGNOSIS — M9904 Segmental and somatic dysfunction of sacral region: Secondary | ICD-10-CM | POA: Diagnosis not present

## 2024-03-03 DIAGNOSIS — M7918 Myalgia, other site: Secondary | ICD-10-CM | POA: Diagnosis not present

## 2024-03-03 DIAGNOSIS — M51362 Other intervertebral disc degeneration, lumbar region with discogenic back pain and lower extremity pain: Secondary | ICD-10-CM | POA: Diagnosis not present

## 2024-03-05 DIAGNOSIS — M9903 Segmental and somatic dysfunction of lumbar region: Secondary | ICD-10-CM | POA: Diagnosis not present

## 2024-03-05 DIAGNOSIS — M5442 Lumbago with sciatica, left side: Secondary | ICD-10-CM | POA: Diagnosis not present

## 2024-03-05 DIAGNOSIS — M7918 Myalgia, other site: Secondary | ICD-10-CM | POA: Diagnosis not present

## 2024-03-05 DIAGNOSIS — M51362 Other intervertebral disc degeneration, lumbar region with discogenic back pain and lower extremity pain: Secondary | ICD-10-CM | POA: Diagnosis not present

## 2024-03-05 DIAGNOSIS — M9904 Segmental and somatic dysfunction of sacral region: Secondary | ICD-10-CM | POA: Diagnosis not present

## 2024-03-05 DIAGNOSIS — M51372 Other intervertebral disc degeneration, lumbosacral region with discogenic back pain and lower extremity pain: Secondary | ICD-10-CM | POA: Diagnosis not present

## 2024-03-08 DIAGNOSIS — M51362 Other intervertebral disc degeneration, lumbar region with discogenic back pain and lower extremity pain: Secondary | ICD-10-CM | POA: Diagnosis not present

## 2024-03-08 DIAGNOSIS — M7918 Myalgia, other site: Secondary | ICD-10-CM | POA: Diagnosis not present

## 2024-03-08 DIAGNOSIS — M9904 Segmental and somatic dysfunction of sacral region: Secondary | ICD-10-CM | POA: Diagnosis not present

## 2024-03-08 DIAGNOSIS — M9903 Segmental and somatic dysfunction of lumbar region: Secondary | ICD-10-CM | POA: Diagnosis not present

## 2024-03-08 DIAGNOSIS — M51372 Other intervertebral disc degeneration, lumbosacral region with discogenic back pain and lower extremity pain: Secondary | ICD-10-CM | POA: Diagnosis not present

## 2024-03-08 DIAGNOSIS — M5442 Lumbago with sciatica, left side: Secondary | ICD-10-CM | POA: Diagnosis not present

## 2024-03-10 DIAGNOSIS — M51362 Other intervertebral disc degeneration, lumbar region with discogenic back pain and lower extremity pain: Secondary | ICD-10-CM | POA: Diagnosis not present

## 2024-03-10 DIAGNOSIS — M7918 Myalgia, other site: Secondary | ICD-10-CM | POA: Diagnosis not present

## 2024-03-10 DIAGNOSIS — M9903 Segmental and somatic dysfunction of lumbar region: Secondary | ICD-10-CM | POA: Diagnosis not present

## 2024-03-10 DIAGNOSIS — M9904 Segmental and somatic dysfunction of sacral region: Secondary | ICD-10-CM | POA: Diagnosis not present

## 2024-03-10 DIAGNOSIS — M51372 Other intervertebral disc degeneration, lumbosacral region with discogenic back pain and lower extremity pain: Secondary | ICD-10-CM | POA: Diagnosis not present

## 2024-03-10 DIAGNOSIS — M5442 Lumbago with sciatica, left side: Secondary | ICD-10-CM | POA: Diagnosis not present

## 2024-03-12 DIAGNOSIS — M51372 Other intervertebral disc degeneration, lumbosacral region with discogenic back pain and lower extremity pain: Secondary | ICD-10-CM | POA: Diagnosis not present

## 2024-03-12 DIAGNOSIS — M5442 Lumbago with sciatica, left side: Secondary | ICD-10-CM | POA: Diagnosis not present

## 2024-03-12 DIAGNOSIS — M9903 Segmental and somatic dysfunction of lumbar region: Secondary | ICD-10-CM | POA: Diagnosis not present

## 2024-03-12 DIAGNOSIS — M7918 Myalgia, other site: Secondary | ICD-10-CM | POA: Diagnosis not present

## 2024-03-12 DIAGNOSIS — M9904 Segmental and somatic dysfunction of sacral region: Secondary | ICD-10-CM | POA: Diagnosis not present

## 2024-03-12 DIAGNOSIS — M51362 Other intervertebral disc degeneration, lumbar region with discogenic back pain and lower extremity pain: Secondary | ICD-10-CM | POA: Diagnosis not present

## 2024-03-16 DIAGNOSIS — M51362 Other intervertebral disc degeneration, lumbar region with discogenic back pain and lower extremity pain: Secondary | ICD-10-CM | POA: Diagnosis not present

## 2024-03-16 DIAGNOSIS — M9903 Segmental and somatic dysfunction of lumbar region: Secondary | ICD-10-CM | POA: Diagnosis not present

## 2024-03-16 DIAGNOSIS — M7918 Myalgia, other site: Secondary | ICD-10-CM | POA: Diagnosis not present

## 2024-03-16 DIAGNOSIS — M51372 Other intervertebral disc degeneration, lumbosacral region with discogenic back pain and lower extremity pain: Secondary | ICD-10-CM | POA: Diagnosis not present

## 2024-03-16 DIAGNOSIS — M9904 Segmental and somatic dysfunction of sacral region: Secondary | ICD-10-CM | POA: Diagnosis not present

## 2024-03-16 DIAGNOSIS — M5442 Lumbago with sciatica, left side: Secondary | ICD-10-CM | POA: Diagnosis not present

## 2024-03-19 DIAGNOSIS — M51362 Other intervertebral disc degeneration, lumbar region with discogenic back pain and lower extremity pain: Secondary | ICD-10-CM | POA: Diagnosis not present

## 2024-03-19 DIAGNOSIS — M5442 Lumbago with sciatica, left side: Secondary | ICD-10-CM | POA: Diagnosis not present

## 2024-03-19 DIAGNOSIS — M51372 Other intervertebral disc degeneration, lumbosacral region with discogenic back pain and lower extremity pain: Secondary | ICD-10-CM | POA: Diagnosis not present

## 2024-03-19 DIAGNOSIS — M7918 Myalgia, other site: Secondary | ICD-10-CM | POA: Diagnosis not present

## 2024-03-19 DIAGNOSIS — M9903 Segmental and somatic dysfunction of lumbar region: Secondary | ICD-10-CM | POA: Diagnosis not present

## 2024-03-19 DIAGNOSIS — M9904 Segmental and somatic dysfunction of sacral region: Secondary | ICD-10-CM | POA: Diagnosis not present

## 2024-03-23 DIAGNOSIS — M51362 Other intervertebral disc degeneration, lumbar region with discogenic back pain and lower extremity pain: Secondary | ICD-10-CM | POA: Diagnosis not present

## 2024-03-23 DIAGNOSIS — M51372 Other intervertebral disc degeneration, lumbosacral region with discogenic back pain and lower extremity pain: Secondary | ICD-10-CM | POA: Diagnosis not present

## 2024-03-23 DIAGNOSIS — M7918 Myalgia, other site: Secondary | ICD-10-CM | POA: Diagnosis not present

## 2024-03-23 DIAGNOSIS — M9903 Segmental and somatic dysfunction of lumbar region: Secondary | ICD-10-CM | POA: Diagnosis not present

## 2024-03-23 DIAGNOSIS — M5442 Lumbago with sciatica, left side: Secondary | ICD-10-CM | POA: Diagnosis not present

## 2024-03-23 DIAGNOSIS — M9904 Segmental and somatic dysfunction of sacral region: Secondary | ICD-10-CM | POA: Diagnosis not present

## 2024-03-25 DIAGNOSIS — M7918 Myalgia, other site: Secondary | ICD-10-CM | POA: Diagnosis not present

## 2024-03-25 DIAGNOSIS — M51362 Other intervertebral disc degeneration, lumbar region with discogenic back pain and lower extremity pain: Secondary | ICD-10-CM | POA: Diagnosis not present

## 2024-03-25 DIAGNOSIS — M5442 Lumbago with sciatica, left side: Secondary | ICD-10-CM | POA: Diagnosis not present

## 2024-03-25 DIAGNOSIS — M9903 Segmental and somatic dysfunction of lumbar region: Secondary | ICD-10-CM | POA: Diagnosis not present

## 2024-03-25 DIAGNOSIS — M51372 Other intervertebral disc degeneration, lumbosacral region with discogenic back pain and lower extremity pain: Secondary | ICD-10-CM | POA: Diagnosis not present

## 2024-03-25 DIAGNOSIS — M9904 Segmental and somatic dysfunction of sacral region: Secondary | ICD-10-CM | POA: Diagnosis not present

## 2024-03-30 DIAGNOSIS — M51372 Other intervertebral disc degeneration, lumbosacral region with discogenic back pain and lower extremity pain: Secondary | ICD-10-CM | POA: Diagnosis not present

## 2024-03-30 DIAGNOSIS — M9903 Segmental and somatic dysfunction of lumbar region: Secondary | ICD-10-CM | POA: Diagnosis not present

## 2024-03-30 DIAGNOSIS — M7918 Myalgia, other site: Secondary | ICD-10-CM | POA: Diagnosis not present

## 2024-03-30 DIAGNOSIS — M9904 Segmental and somatic dysfunction of sacral region: Secondary | ICD-10-CM | POA: Diagnosis not present

## 2024-03-30 DIAGNOSIS — M5442 Lumbago with sciatica, left side: Secondary | ICD-10-CM | POA: Diagnosis not present

## 2024-03-30 DIAGNOSIS — M51362 Other intervertebral disc degeneration, lumbar region with discogenic back pain and lower extremity pain: Secondary | ICD-10-CM | POA: Diagnosis not present

## 2024-04-01 DIAGNOSIS — M9904 Segmental and somatic dysfunction of sacral region: Secondary | ICD-10-CM | POA: Diagnosis not present

## 2024-04-01 DIAGNOSIS — M51372 Other intervertebral disc degeneration, lumbosacral region with discogenic back pain and lower extremity pain: Secondary | ICD-10-CM | POA: Diagnosis not present

## 2024-04-01 DIAGNOSIS — M7918 Myalgia, other site: Secondary | ICD-10-CM | POA: Diagnosis not present

## 2024-04-01 DIAGNOSIS — M5442 Lumbago with sciatica, left side: Secondary | ICD-10-CM | POA: Diagnosis not present

## 2024-04-01 DIAGNOSIS — M9903 Segmental and somatic dysfunction of lumbar region: Secondary | ICD-10-CM | POA: Diagnosis not present

## 2024-04-01 DIAGNOSIS — M51362 Other intervertebral disc degeneration, lumbar region with discogenic back pain and lower extremity pain: Secondary | ICD-10-CM | POA: Diagnosis not present

## 2024-04-02 ENCOUNTER — Encounter: Payer: Self-pay | Admitting: Family Medicine

## 2024-04-02 ENCOUNTER — Ambulatory Visit: Admitting: Family Medicine

## 2024-04-02 VITALS — BP 130/71 | HR 77 | Ht 60.0 in | Wt 168.0 lb

## 2024-04-02 DIAGNOSIS — E785 Hyperlipidemia, unspecified: Secondary | ICD-10-CM | POA: Diagnosis not present

## 2024-04-02 DIAGNOSIS — N1831 Chronic kidney disease, stage 3a: Secondary | ICD-10-CM

## 2024-04-02 DIAGNOSIS — Z7984 Long term (current) use of oral hypoglycemic drugs: Secondary | ICD-10-CM | POA: Diagnosis not present

## 2024-04-02 DIAGNOSIS — E1169 Type 2 diabetes mellitus with other specified complication: Secondary | ICD-10-CM

## 2024-04-02 DIAGNOSIS — Z789 Other specified health status: Secondary | ICD-10-CM

## 2024-04-02 DIAGNOSIS — M5440 Lumbago with sciatica, unspecified side: Secondary | ICD-10-CM | POA: Diagnosis not present

## 2024-04-02 DIAGNOSIS — E1122 Type 2 diabetes mellitus with diabetic chronic kidney disease: Secondary | ICD-10-CM

## 2024-04-02 LAB — POCT GLYCOSYLATED HEMOGLOBIN (HGB A1C): Hemoglobin A1C: 7.1 % — AB (ref 4.0–5.6)

## 2024-04-02 NOTE — Progress Notes (Signed)
 Established patient visit   Patient: [name] [name]   DOB: Apr 05, 1951   73 y.o. Female  MRN: [id number] Visit Date: 04/02/2024  Today's healthcare provider: LAURAINE LOISE BUOY, DO   Chief Complaint  Patient presents with   Diabetes    Stop taking the medication (empagliflozin ), leg and joint pain she is not sure if it was due to the medication, after she did some research and read people age 4 and older could cause the leg and joint pain, she has been seeing a chiropractor things has gotten a lot better.    Subjective    HPI [name] [name] is a 73 year old female with diabetes and hypertension who presents for follow-up on her back pain and diabetes management.  She has ongoing sciatic nerve pain and lower back discomfort. She reports that her chiropractor found a bulging disc. Over the past seven weeks, she has noted improvement, with reduced leg numbness and some good days, though discomfort persists on certain days. She performs exercises recommended by her chiropractor (Dr. Alm Sander) and plans to start regular exercise at a women's gym.  She discontinued Jardiance  due to concerns about joint problems and stopped metformin  two months ago due to it causing her to feel unwell. She monitors her blood sugar at home, with readings typically in the 120s, though slightly higher this morning. Her recent A1c was 7.1 in June. She experienced low blood sugar episodes twice in the past three to six months.  Her blood pressure has been variable, with fluctuations noted at home and during medical visits. She attributes some higher readings to stress from house and insurance issues. She has not been checking her blood pressure regularly at home.  She has a history of high cholesterol, which she believes is hereditary, as her son also had high cholesterol despite lifestyle changes. She experienced adverse reactions to cholesterol medications in the past, including fever and rash.  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 Once a week    sertraline  (ZOLOFT ) 100 MG tablet Take 1 tablet (100 mg total) by mouth at bedtime.    [DISCONTINUED] empagliflozin  (JARDIANCE ) 10 MG TABS tablet Take 1 tablet (10 mg total) by mouth daily before breakfast. 04/02/2024: leg pain   No facility-administered medications prior to visit.        Objective    BP 130/71 (BP Location: Left Arm, Patient Position: Sitting, Cuff Size: Normal)   Pulse 77   Ht 5' (1.524 m)   Wt 168 lb (76.2 kg)   SpO2 98%   BMI 32.81 kg/m     Physical Exam Vitals and nursing note reviewed.  Constitutional:      General: She is not in acute distress.    Appearance: Normal appearance.  HENT:     Head: Normocephalic and atraumatic.  Eyes:     General: No scleral icterus.    Conjunctiva/sclera: Conjunctivae normal.  Cardiovascular:     Rate and Rhythm: Normal rate.  Pulmonary:     Effort: Pulmonary effort is normal.  Skin:    Comments: Thin, well-healed, vertical, surgical scar to midline of left knee.  Neurological:     Mental Status: She  is alert and oriented to person, place, and time. Mental status is at baseline.  Psychiatric:        Mood and Affect: Mood normal.        Behavior: Behavior normal.      Results for orders placed or performed in visit on 04/02/24  POCT glycosylated hemoglobin (Hb A1C)  Result Value Ref Range   Hemoglobin A1C 7.1 (A) 4.0 - 5.6 %   HbA1c POC (<> result, manual entry)     HbA1c, POC (prediabetic range)     HbA1c, POC (controlled diabetic range)      Assessment & Plan    Type 2 diabetes mellitus with stage 3a chronic kidney disease, without long-term current use of insulin  (HCC) -     POCT glycosylated hemoglobin (Hb  A1C)  Acute bilateral low back pain with sciatica, sciatica laterality unspecified  Hyperlipidemia associated with type 2 diabetes mellitus (HCC)  Statin intolerance    Type 2 diabetes mellitus with diabetic chronic kidney disease A1c at 7.1 today. Blood sugars fluctuate with reported occasional hypoglycemia. Discontinued metformin  and Jardiance  due to side effects. Currently not on diabetes medication, manages hypoglycemia with food intake.  She is currently working to increase her physical activity, as she continues to recover from her left knee surgery. - Patient prefers to hold all antihyperglycemic's at this time and continue to focus on lifestyle modifications. - Encourage regular exercise for blood sugar management. - Monitor blood sugar levels at home. - Coordinate with nephrology for lab work in November.  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 Chiropractic care improved symptoms, eliminating leg numbness. Variable symptom days, follows chiropractor-recommended exercises. - Continue chiropractic care for one more week. - Encourage continuation of prescribed exercises, including stretching and strengthening. - Encourage participation in a women's gym program for regular exercise.  Hyperlipidemia; statin intolerance Hyperlipidemia with elevated cholesterol levels. Not on statins due to adverse reactions.  Patient also tried statin alternatives including PCSK9 inhibitors with adverse effects. - Encourage healthy lifestyle modifications, including regular exercise and diet changes.  General Health Maintenance Due for mammogram and bone density test. Receptive to TDAP vaccine, declines flu and COVID-19 vaccines. - Schedule mammogram and bone density test.  Contact information given to patient - Received Tdap vaccine at local pharmacy    Follow-Up Plans for ongoing management and specialist coordination. -  Follow up with nephrology for lab work in November.  Return in about 3 months (around 07/02/2024) for Chronic f/u.      I discussed the assessment and treatment plan with the patient  The patient was provided an opportunity to ask questions and all were answered. The patient agreed with the plan and demonstrated an understanding of the instructions.   The patient was advised to call back or seek an in-person evaluation if the symptoms worsen or if the condition fails to improve as anticipated.    LAURAINE LOISE BUOY, DO  [facility] [phone] (phone) [phone] (fax)  [facility]

## 2024-04-02 NOTE — Patient Instructions (Addendum)
 Please call the [facility] [phone]) to schedule a routine screening mammogram and bone density test.   Recommended vaccines: Tdap (tetanus, diphtheria, and pertussis)

## 2024-04-06 DIAGNOSIS — M9904 Segmental and somatic dysfunction of sacral region: Secondary | ICD-10-CM | POA: Diagnosis not present

## 2024-04-06 DIAGNOSIS — M9903 Segmental and somatic dysfunction of lumbar region: Secondary | ICD-10-CM | POA: Diagnosis not present

## 2024-04-06 DIAGNOSIS — M7918 Myalgia, other site: Secondary | ICD-10-CM | POA: Diagnosis not present

## 2024-04-06 DIAGNOSIS — M51372 Other intervertebral disc degeneration, lumbosacral region with discogenic back pain and lower extremity pain: Secondary | ICD-10-CM | POA: Diagnosis not present

## 2024-04-06 DIAGNOSIS — M5442 Lumbago with sciatica, left side: Secondary | ICD-10-CM | POA: Diagnosis not present

## 2024-04-06 DIAGNOSIS — M51362 Other intervertebral disc degeneration, lumbar region with discogenic back pain and lower extremity pain: Secondary | ICD-10-CM | POA: Diagnosis not present

## 2024-04-08 DIAGNOSIS — M7918 Myalgia, other site: Secondary | ICD-10-CM | POA: Diagnosis not present

## 2024-04-08 DIAGNOSIS — M51362 Other intervertebral disc degeneration, lumbar region with discogenic back pain and lower extremity pain: Secondary | ICD-10-CM | POA: Diagnosis not present

## 2024-04-08 DIAGNOSIS — M51372 Other intervertebral disc degeneration, lumbosacral region with discogenic back pain and lower extremity pain: Secondary | ICD-10-CM | POA: Diagnosis not present

## 2024-04-08 DIAGNOSIS — M9904 Segmental and somatic dysfunction of sacral region: Secondary | ICD-10-CM | POA: Diagnosis not present

## 2024-04-08 DIAGNOSIS — M9903 Segmental and somatic dysfunction of lumbar region: Secondary | ICD-10-CM | POA: Diagnosis not present

## 2024-04-08 DIAGNOSIS — M5442 Lumbago with sciatica, left side: Secondary | ICD-10-CM | POA: Diagnosis not present

## 2024-04-13 DIAGNOSIS — E119 Type 2 diabetes mellitus without complications: Secondary | ICD-10-CM | POA: Diagnosis not present

## 2024-04-14 LAB — LAB REPORT - SCANNED
Albumin, Urine POC: 2.8
Creatinine, POC: 110 mg/dL
EGFR: 43
Microalb Creat Ratio: 25

## 2024-04-27 NOTE — Progress Notes (Signed)
 [name] [name]                                          MRN: [id number]   04/27/2024   The [facility] Quality Team Specialist reviewed this patient medical record for the purposes of chart review for care gap closure. The following were reviewed: chart review for care gap closure-kidney health evaluation for diabetes:eGFR  and uACR.    [facility] Quality Team

## 2024-05-06 DIAGNOSIS — M7918 Myalgia, other site: Secondary | ICD-10-CM | POA: Diagnosis not present

## 2024-05-06 DIAGNOSIS — M9903 Segmental and somatic dysfunction of lumbar region: Secondary | ICD-10-CM | POA: Diagnosis not present

## 2024-05-06 DIAGNOSIS — M51372 Other intervertebral disc degeneration, lumbosacral region with discogenic back pain and lower extremity pain: Secondary | ICD-10-CM | POA: Diagnosis not present

## 2024-05-06 DIAGNOSIS — M9904 Segmental and somatic dysfunction of sacral region: Secondary | ICD-10-CM | POA: Diagnosis not present

## 2024-05-06 DIAGNOSIS — M5442 Lumbago with sciatica, left side: Secondary | ICD-10-CM | POA: Diagnosis not present

## 2024-05-06 DIAGNOSIS — M51362 Other intervertebral disc degeneration, lumbar region with discogenic back pain and lower extremity pain: Secondary | ICD-10-CM | POA: Diagnosis not present

## 2024-05-24 ENCOUNTER — Ambulatory Visit
Admission: RE | Admit: 2024-05-24 | Discharge: 2024-05-24 | Disposition: A | Discharge status: Home / Self Care | Source: Ambulatory Visit | Attending: Family Medicine | Admitting: Family Medicine

## 2024-05-24 DIAGNOSIS — M85832 Other specified disorders of bone density and structure, left forearm: Secondary | ICD-10-CM | POA: Diagnosis not present

## 2024-05-24 DIAGNOSIS — Z13820 Encounter for screening for osteoporosis: Secondary | ICD-10-CM | POA: Insufficient documentation

## 2024-05-24 DIAGNOSIS — Z1231 Encounter for screening mammogram for malignant neoplasm of breast: Secondary | ICD-10-CM | POA: Insufficient documentation

## 2024-05-24 DIAGNOSIS — Z780 Asymptomatic menopausal state: Secondary | ICD-10-CM | POA: Insufficient documentation

## 2024-05-25 ENCOUNTER — Telehealth: Payer: Self-pay | Admitting: Pharmacist

## 2024-05-25 DIAGNOSIS — N1831 Chronic kidney disease, stage 3a: Secondary | ICD-10-CM | POA: Diagnosis not present

## 2024-05-25 DIAGNOSIS — F411 Generalized anxiety disorder: Secondary | ICD-10-CM | POA: Diagnosis not present

## 2024-05-25 DIAGNOSIS — E785 Hyperlipidemia, unspecified: Secondary | ICD-10-CM | POA: Diagnosis not present

## 2024-05-25 DIAGNOSIS — N200 Calculus of kidney: Secondary | ICD-10-CM | POA: Diagnosis not present

## 2024-05-25 DIAGNOSIS — I10 Essential (primary) hypertension: Secondary | ICD-10-CM | POA: Diagnosis not present

## 2024-05-25 NOTE — Progress Notes (Signed)
 Pharmacy Quality Measure Review  This patient is appearing on the insurance-providing list for being at risk of failing the adherence measure for Statin Use in Persons with Diabetes (SUPD) medications this calendar year.   [facility]. Noted intolerance to several statins, ezetimibe, PCSK9i, and Leqvio . Hesitant to try new medications. History of myalgia to statins will not exclude from Statin Use in Persons with Diabetes measure.   No action needed at this time.   Catie [facility], PharmD, [facility] Clinical Pharmacist [phone]

## 2024-05-26 ENCOUNTER — Telehealth: Payer: Self-pay

## 2024-05-26 NOTE — Telephone Encounter (Signed)
 Copied from CRM #[id number]. Topic: Clinical - Medical Advice >> May 26, 2024 12:46 PM Willma R wrote: Reason for CRM: Patient is looking to get a tetanus shot without the diphtheria (had an reaction last time). States she has tried multiple pharmacy's and they do not have it without. Is asking for guidance on where she is able to go.  Patient can be reached at [phone]

## 2024-06-01 DIAGNOSIS — E119 Type 2 diabetes mellitus without complications: Secondary | ICD-10-CM | POA: Diagnosis not present

## 2024-06-01 DIAGNOSIS — F411 Generalized anxiety disorder: Secondary | ICD-10-CM | POA: Diagnosis not present

## 2024-06-01 DIAGNOSIS — N200 Calculus of kidney: Secondary | ICD-10-CM | POA: Diagnosis not present

## 2024-06-01 DIAGNOSIS — I10 Essential (primary) hypertension: Secondary | ICD-10-CM | POA: Diagnosis not present

## 2024-06-01 DIAGNOSIS — E785 Hyperlipidemia, unspecified: Secondary | ICD-10-CM | POA: Diagnosis not present

## 2024-06-01 DIAGNOSIS — N1831 Chronic kidney disease, stage 3a: Secondary | ICD-10-CM | POA: Diagnosis not present

## 2024-06-03 NOTE — Telephone Encounter (Signed)
 Called the [facility] Department and they stated that they do have the Tetanus-only vaccine, LVM stating this to patient.  Will also send a MyChart message informing her of where she can go.  [facility] Department phone number is [phone].  If patient calls back okay for E2C2 to relay the information.

## 2024-06-09 ENCOUNTER — Encounter: Payer: Self-pay | Admitting: Family Medicine

## 2024-06-09 ENCOUNTER — Ambulatory Visit: Payer: Self-pay | Admitting: Family Medicine

## 2024-06-12 ENCOUNTER — Other Ambulatory Visit: Payer: Self-pay | Admitting: Family Medicine

## 2024-06-27 ENCOUNTER — Other Ambulatory Visit: Payer: Self-pay | Admitting: Family Medicine

## 2024-06-27 DIAGNOSIS — E1159 Type 2 diabetes mellitus with other circulatory complications: Secondary | ICD-10-CM

## 2024-06-28 ENCOUNTER — Other Ambulatory Visit: Payer: Self-pay

## 2024-06-28 MED ORDER — EMPAGLIFLOZIN 10 MG PO TABS: 10.0000 mg | ORAL_TABLET | Freq: Every day | ORAL | 0 refills | Status: AC

## 2024-07-05 ENCOUNTER — Ambulatory Visit

## 2024-07-05 VITALS — Ht 60.0 in | Wt 168.0 lb

## 2024-07-05 DIAGNOSIS — Z0000 Encounter for general adult medical examination without abnormal findings: Secondary | ICD-10-CM

## 2024-07-05 NOTE — Progress Notes (Addendum)
 Chief Complaint  Patient presents with   Medicare Wellness     Subjective:   [name] [name] is a 73 y.o. female who presents for a Medicare Annual Wellness Visit.  Visit info / Clinical Intake: Persons participating in visit and providing information:: patient Medicare Wellness Visit Mode:: Telephone If telephone:: video declined Since this visit was completed virtually, some vitals may be partially provided or unavailable. Missing vitals are due to the limitations of the virtual format.: Unable to obtain vitals - no equipment If Telephone or Video please confirm:: I connected with patient using audio/video enable telemedicine. I verified patient identity with two identifiers, discussed telehealth limitations, and patient agreed to proceed. Patient Location:: home Provider Location:: home Interpreter Needed?: No Pre-visit prep was completed: yes AWV questionnaire completed by patient prior to visit?: no Living arrangements:: lives with spouse/significant other Patient's Overall Health Status Rating: very good Typical amount of pain: none Does pain affect daily life?: no Are you currently prescribed opioids?: no  Dietary Habits and Nutritional Risks How many meals a day?: 3 Eats fruit and vegetables daily?: yes Most meals are obtained by: preparing own meals In the last 2 weeks, have you had any of the following?: (!) nausea, vomiting, diarrhea Diabetic:: (!) yes Any non-healing wounds?: no How often do you check your BS?: 1 Would you like to be referred to a Nutritionist or for Diabetic Management? : no  Functional Status Activities of Daily Living (to include ambulation/medication): Independent Ambulation: Independent with device- listed below Home Assistive Devices/Equipment: Eyeglasses Medication Administration: Independent Home Management (perform basic housework or laundry): Independent Manage your own finances?: yes Primary transportation is: driving Concerns  about vision?: no *vision screening is required for WTM* Concerns about hearing?: (!) yes Uses hearing aids?: (!) yes Hear whispered voice?: yes  Fall Screening Falls in the past year?: 0 Number of falls in past year: 0 Was there an injury with Fall?: 0 Fall Risk Category Calculator: 0 Patient Fall Risk Level: Low Fall Risk  Fall Risk Patient at Risk for Falls Due to: No Fall Risks Fall risk Follow up: Falls evaluation completed; Falls prevention discussed  Home and Transportation Safety: All rugs have non-skid backing?: N/A, no rugs All stairs or steps have railings?: yes Grab bars in the bathtub or shower?: yes Have non-skid surface in bathtub or shower?: yes Good home lighting?: yes Regular seat belt use?: yes Hospital stays in the last year:: (!) yes How many hospital stays:: 1 Reason: Knee replacement  Cognitive Assessment Difficulty concentrating, remembering, or making decisions? : no Will 6CIT or Mini Cog be Completed: no 6CIT or Mini Cog Declined: patient alert, oriented, able to answer questions appropriately and recall recent events  Advance Directives (For Healthcare) Does Patient Have a Medical Advance Directive?: Yes Does patient want to make changes to medical advance directive?: No - Patient declined Type of Advance Directive: Healthcare Power of [place]; Living will Copy of Healthcare Power of Attorney in Chart?: No - copy requested  Reviewed/Updated  Reviewed/Updated: Reviewed All (Medical, Surgical, Family, Medications, Allergies, Care Teams, Patient Goals)    Allergies (verified) Lipitor [atorvastatin], Lovastatin, Statins, Wheat, Zetia  [ezetimibe], Flagyl [metronidazole], and Leqvio  [inclisiran sodium ]   Current Medications (verified) Outpatient Encounter Medications as of 07/05/2024  Medication Sig   aspirin  81 MG chewable tablet Chew 81 mg by mouth. Once a week   empagliflozin  (JARDIANCE ) 10 MG TABS tablet Take 1 tablet (10 mg total) by mouth  daily.   sertraline  (ZOLOFT ) 100  MG tablet Take 1 tablet (100 mg total) by mouth at bedtime.   No facility-administered encounter medications on file as of 07/05/2024.    History: Past Medical History:  Diagnosis Date   Allergy    Anxiety    Arthritis    Basal cell carcinoma    in vaginal area   Basal cell carcinoma (BCC) in situ of skin    Cataract 2022   Just beginning   Chronic kidney disease    CMV (cytomegalovirus infection) (HCC)    Complication of anesthesia    Depression    Diabetes mellitus without complication (HCC) 2013?   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   PONV (postoperative nausea and vomiting)    Portal vein thrombosis 2015   Pre-diabetes    Shingles    VAIN I (vaginal intraepithelial neoplasia grade I)    Past Surgical History:  Procedure Laterality Date   ABDOMINAL HYSTERECTOMY  1986   BLADDER SURGERY     at the same time of hysterectomy   EXCISION HAGLUND'S DEFORMITY WITH ACHILLES TENDON REPAIR Right 10/11/2021   Procedure: Right gastroc recession, excision of Haglund deformity, Achilles tendon debridement and reconstruction;  Surgeon: Kit Rush, MD;  Location: [facility];  Service: Orthopedics;  Laterality: Right;   FOOT SURGERY Left 08/20/2012   Dr. 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   Procedure: ARTHROPLASTY, KNEE, TOTAL;  Surgeon: Duwayne Purchase, MD;  Location: [facility];  Service: Orthopedics;  Laterality: Left;   TUBAL LIGATION     WISDOM TOOTH EXTRACTION  1986   Family History  Problem Relation Age of Onset   Parkinsonism Mother    Breast cancer Mother 63   Arthritis Mother    Cancer Mother    Hearing loss Mother    Miscarriages / Stillbirths Mother     Alcohol abuse Father    Diabetes Father    Lymphoma Son    Diabetes Son    Drug abuse Son    Obesity Son    Arthritis Brother    Hearing loss Brother    Cancer Son    Social History   Occupational History   Occupation: retired runner, broadcasting/film/video   Occupation: book nurse, 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  Tobacco Use   Smoking status: Never   Smokeless tobacco: Never  Vaping Use   Vaping status: Never Used  Substance and Sexual Activity   Alcohol use: Yes    Comment: rare   Drug use: Never   Sexual activity: Not Currently    Birth control/protection: None    Comment: hysterectomy   Tobacco Counseling Counseling given: Not Answered  SDOH Screenings   Food Insecurity: No Food Insecurity (07/05/2024)  Housing: Unknown (07/05/2024)  Transportation Needs: No Transportation Needs (07/05/2024)  Utilities: Not At Risk (07/05/2024)  Alcohol Screen: Low Risk  (01/01/2024)  Depression (PHQ2-9): Low Risk  (07/05/2024)  Financial Resource Strain: Low Risk  (03/31/2024)  Physical Activity: Sufficiently Active (07/05/2024)  Social Connections: Moderately Isolated (07/05/2024)  Stress: No Stress Concern Present (07/05/2024)  Tobacco Use: Low Risk  (07/05/2024)  Health Literacy: Adequate Health Literacy (  07/05/2024)   See flowsheets for full screening details  Depression Screen PHQ 2 & 9 Depression Scale- Over the past 2 weeks, how often have you been bothered by any of the following problems? Little interest or pleasure in doing things: 0 Feeling down, depressed, or hopeless (PHQ Adolescent also includes...irritable): 0 PHQ-2 Total Score: 0 Trouble falling or staying asleep, or sleeping too much: 0 Feeling tired or having little energy: 0 Poor appetite or overeating (PHQ Adolescent also includes...weight loss): 0 Feeling bad about yourself - or that you are a failure or have let yourself or your family down: 0 Trouble concentrating on things, such as reading the newspaper or watching television (PHQ  Adolescent also includes...like school work): 0 Moving or speaking so slowly that other people could have noticed. Or the opposite - being so fidgety or restless that you have been moving around a lot more than usual: 0 Thoughts that you would be better off dead, or of hurting yourself in some way: 0 PHQ-9 Total Score: 0 If you checked off any problems, how difficult have these problems made it for you to do your work, take care of things at home, or get along with other people?: Not difficult at all  Depression Treatment Depression Interventions/Treatment : EYV7-0 Score <4 Follow-up Not Indicated     Goals Addressed             This Visit's Progress    DIET - INCREASE WATER  INTAKE   On track    Recommend to drink at least 6-8 8oz glasses of water  per day.             Objective:    Today's Vitals   07/05/24 0853  Weight: 168 lb (76.2 kg)  Height: 5' (1.524 m)   Body mass index is 32.81 kg/m.  Hearing/Vision screen Hearing Screening - Comments:: Wears hearing aides Vision Screening - Comments:: Wears eyeglasses/UTD/[facility] Immunizations and Health Maintenance Health Maintenance  Topic Date Due   DTaP/Tdap/Td (3 - Td or Tdap) 10/08/2022   Pneumococcal Vaccine: 50+ Years (1 of 2 - PCV) 09/30/2024 (Originally 09/20/1969)   COLON CANCER SCREENING ANNUAL FOBT  09/30/2024 (Originally 11/29/2022)   Influenza Vaccine  10/26/2024 (Originally 02/27/2024)   FOOT EXAM  08/19/2024   OPHTHALMOLOGY EXAM  09/07/2024   HEMOGLOBIN A1C  09/30/2024   Diabetic kidney evaluation - eGFR measurement  04/14/2025   Diabetic kidney evaluation - Urine ACR  04/14/2025   Medicare Annual Wellness (AWV)  07/05/2025   Mammogram  05/24/2026   Bone Density Scan  05/24/2029   Hepatitis C Screening  Completed   Zoster Vaccines- Shingrix  Completed   Meningococcal B Vaccine  Aged Out   COVID-19 Vaccine  Discontinued   Fecal DNA (Cologuard)  Discontinued        Assessment/Plan:  This is  a routine wellness examination for [name] [name].  Patient Care Team: Pardue, Lauraine SAILOR, DO as PCP - General (Family Medicine) Orman Erminio POUR, PA-C as Consulting Physician Broadus Bare, OD as Consulting Physician (Optometry) Gregorio Adine MATSU, MD as Referring Physician (Dermatology) Francisca Redell BROCKS, MD as Consulting Physician (Urology)  I have personally reviewed and noted the following in the patient's chart:   Medical and social history Use of alcohol, tobacco or illicit drugs  Current medications and supplements including opioid prescriptions. Functional ability and status Nutritional status Physical activity Advanced directives List of other physicians Hospitalizations, surgeries, and ER visits in previous 12 months Vitals Screenings to include cognitive, depression,  and falls Referrals and appointments  No orders of the defined types were placed in this encounter.  In addition, I have reviewed and discussed with patient certain preventive protocols, quality metrics, and best practice recommendations. A written personalized care plan for preventive services as well as general preventive health recommendations were provided to patient.   [name] [name], CMA   07/05/2024   Return in 1 year (on 07/05/2025).  After Visit Summary: (MyChart) Due to this being a telephonic visit, the after visit summary with patients personalized plan was offered to patient via MyChart   Nurse Notes: Patient is due for a TDAp.  Patient stated that since back taking Jardiance , she has noticed she has been getting yeast infections.  I informed patient that it could be a side effect from medication.  I also informed patient to call or send a message informing her provider to schedule an office visit for evaluation.  She had no other concerns to address today.

## 2024-07-05 NOTE — Patient Instructions (Addendum)
 [name] [name],  Thank you for taking the time for your Medicare Wellness Visit. I appreciate your continued commitment to your health goals. Please review the care plan we discussed, and feel free to reach out if I can assist you further.  Please note that Annual Wellness Visits do not include a physical exam. Some assessments may be limited, especially if the visit was conducted virtually. If needed, we may recommend an in-person follow-up with your provider.  Ongoing Care Seeing your primary care provider every 3 to 6 months helps us  monitor your health and provide consistent, personalized care. Last office visit on 04/02/2024.  You are due for a tetanus vaccine.  Remember to send a message to provider about side effects of Jardiance .  Keep up the good work.  Referrals If a referral was made during today's visit and you haven't received any updates within two weeks, please contact the referred provider directly to check on the status.  Recommended Screenings:  Health Maintenance  Topic Date Due   DTaP/Tdap/Td vaccine (3 - Td or Tdap) 10/08/2022   Pneumococcal Vaccine for age over 47 (1 of 2 - PCV) 09/30/2024*   Stool Blood Test  09/30/2024*   Flu Shot  10/26/2024*   Complete foot exam   08/19/2024   Eye exam for diabetics  09/07/2024   Hemoglobin A1C  09/30/2024   Yearly kidney function blood test for diabetes  04/14/2025   Yearly kidney health urinalysis for diabetes  04/14/2025   Medicare Annual Wellness Visit  07/05/2025   Breast Cancer Screening  05/24/2026   Osteoporosis screening with Bone Density Scan  05/24/2029   Hepatitis C Screening  Completed   Zoster (Shingles) Vaccine  Completed   Meningitis B Vaccine  Aged Out   COVID-19 Vaccine  Discontinued   Cologuard (Stool DNA test)  Discontinued  *Topic was postponed. The date shown is not the original due date.       07/05/2024    8:55 AM  Advanced Directives  Does Patient Have a Medical Advance Directive? Yes  Type of  Estate Agent of [employer];Living will    Vision: Annual vision screenings are recommended for early detection of glaucoma, cataracts, and diabetic retinopathy. These exams can also reveal signs of chronic conditions such as diabetes and high blood pressure.  Dental: Annual dental screenings help detect early signs of oral cancer, gum disease, and other conditions linked to overall health, including heart disease and diabetes.  Please see the attached documents for additional preventive care recommendations.

## 2024-07-30 ENCOUNTER — Ambulatory Visit: Payer: Self-pay

## 2024-07-30 NOTE — Telephone Encounter (Signed)
 FYI Only or Action Required?: Action required by provider: request for appointment.  Patient was last seen in primary care on 04/02/2024 by Donzella Lauraine SAILOR, DO.  Called Nurse Triage reporting Otalgia.  Symptoms began several days ago.  Interventions attempted: Rest, hydration, or home remedies.  Symptoms are: gradually worsening.Left ear pain, worsening. Runny nose.  Triage Disposition: See HCP Within 4 Hours (Or PCP Triage)  Patient/caregiver understands and will follow disposition?: Yes      Copied from CRM #[id number]. Topic: Clinical - Red Word Triage >> Jul 30, 2024  8:48 AM Amy B wrote: Red Word that prompted transfer to Nurse Triage: Left ear pain radiating towards temple and jaw, fever Reason for Disposition  [1] SEVERE pain (e.g., excruciating) and [2] not improved 2 hours after pain medicine (e.g., acetaminophen  or ibuprofen)  Answer Assessment - Initial Assessment Questions 1. LOCATION: Which ear is involved?     Left  2. ONSET: When did the ear pain start?      2 days 3. SEVERITY: How bad is the pain?  (Scale 1-10; mild, moderate or severe)     Worse 10 4. URI SYMPTOMS: Do you have a runny nose or cough?     Runny nose 5. FEVER: Do you have a fever? If Yes, ask: What is your temperature, how was it measured, and when did it start?     Yes 103 6. CAUSE: Have you been swimming recently?, How often do you use Q-TIPS?, Have you had any recent air travel or scuba diving?     no 7. OTHER SYMPTOMS: Do you have any other symptoms? (e.g., decreased hearing, dizziness, headache, stiff neck, vomiting)     no 8. PREGNANCY: Is there any chance you are pregnant? When was your last menstrual period?     no  Protocols used: [place]
